# Patient Record
Sex: Male | Born: 1955 | Race: Black or African American | Hispanic: No | Marital: Married | Smoking: Current every day smoker
Health system: Southern US, Community
[De-identification: ages and names within clinical notes are randomized; demographics above are authoritative.]

## PROBLEM LIST (undated history)

## (undated) DIAGNOSIS — F1010 Alcohol abuse, uncomplicated: Secondary | ICD-10-CM

## (undated) DIAGNOSIS — Z9989 Dependence on other enabling machines and devices: Secondary | ICD-10-CM

## (undated) DIAGNOSIS — R42 Dizziness and giddiness: Secondary | ICD-10-CM

## (undated) DIAGNOSIS — I10 Essential (primary) hypertension: Secondary | ICD-10-CM

## (undated) HISTORY — PX: OTHER SURGICAL HISTORY: SHX169

## (undated) HISTORY — PX: MOUTH SURGERY: SHX715

---

## 2001-05-10 ENCOUNTER — Encounter: Payer: Self-pay | Admitting: Internal Medicine

## 2001-05-10 ENCOUNTER — Ambulatory Visit (HOSPITAL_COMMUNITY): Admission: RE | Admit: 2001-05-10 | Discharge: 2001-05-10 | Payer: Self-pay | Admitting: Internal Medicine

## 2006-06-14 ENCOUNTER — Emergency Department (HOSPITAL_COMMUNITY): Admission: EM | Admit: 2006-06-14 | Discharge: 2006-06-14 | Payer: Self-pay | Admitting: Emergency Medicine

## 2007-07-21 ENCOUNTER — Emergency Department (HOSPITAL_COMMUNITY): Admission: EM | Admit: 2007-07-21 | Discharge: 2007-07-21 | Payer: Self-pay | Admitting: Emergency Medicine

## 2007-07-22 ENCOUNTER — Emergency Department (HOSPITAL_COMMUNITY): Admission: EM | Admit: 2007-07-22 | Discharge: 2007-07-22 | Payer: Self-pay | Admitting: Emergency Medicine

## 2007-10-24 HISTORY — PX: COLONOSCOPY: SHX174

## 2008-06-03 ENCOUNTER — Encounter: Payer: Self-pay | Admitting: Internal Medicine

## 2008-06-03 ENCOUNTER — Ambulatory Visit: Payer: Self-pay | Admitting: Internal Medicine

## 2008-06-03 ENCOUNTER — Ambulatory Visit (HOSPITAL_COMMUNITY): Admission: RE | Admit: 2008-06-03 | Discharge: 2008-06-03 | Payer: Self-pay | Admitting: Internal Medicine

## 2008-11-12 ENCOUNTER — Ambulatory Visit: Payer: Self-pay | Admitting: Orthopedic Surgery

## 2008-11-12 ENCOUNTER — Emergency Department (HOSPITAL_COMMUNITY): Admission: EM | Admit: 2008-11-12 | Discharge: 2008-11-13 | Payer: Self-pay | Admitting: Emergency Medicine

## 2009-03-15 ENCOUNTER — Encounter (HOSPITAL_COMMUNITY): Admission: RE | Admit: 2009-03-15 | Discharge: 2009-04-21 | Payer: Self-pay | Admitting: Orthopaedic Surgery

## 2009-04-22 ENCOUNTER — Encounter (HOSPITAL_COMMUNITY): Admission: RE | Admit: 2009-04-22 | Discharge: 2009-05-22 | Payer: Self-pay | Admitting: Orthopaedic Surgery

## 2009-05-24 ENCOUNTER — Encounter (HOSPITAL_COMMUNITY): Admission: RE | Admit: 2009-05-24 | Discharge: 2009-06-23 | Payer: Self-pay | Admitting: Orthopaedic Surgery

## 2009-06-30 ENCOUNTER — Encounter (HOSPITAL_COMMUNITY): Admission: RE | Admit: 2009-06-30 | Discharge: 2009-07-22 | Payer: Self-pay | Admitting: Orthopaedic Surgery

## 2011-02-06 LAB — BASIC METABOLIC PANEL
BUN: 6 mg/dL (ref 6–23)
CO2: 28 mEq/L (ref 19–32)
Calcium: 9.2 mg/dL (ref 8.4–10.5)
Chloride: 99 mEq/L (ref 96–112)
Creatinine, Ser: 0.66 mg/dL (ref 0.4–1.5)
GFR calc Af Amer: >60 mL/min (ref >60)
GFR calc non Af Amer: >60 mL/min (ref >60)
Glucose, Bld: 103 mg/dL — ABNORMAL HIGH (ref 70–99)
Potassium: 3.1 mEq/L — ABNORMAL LOW (ref 3.5–5.1)
Sodium: 131 mEq/L — ABNORMAL LOW (ref 135–145)

## 2011-02-06 LAB — DIFFERENTIAL
Basophils Absolute: 0 10*3/uL (ref 0.0–0.1)
Basophils Relative: 1 % (ref 0–1)
Neutro Abs: 4.1 10*3/uL (ref 1.7–7.7)
Neutrophils Relative %: 55 % (ref 43–77)

## 2011-02-06 LAB — TYPE AND SCREEN
ABO/RH(D): A POS
Antibody Screen: NEGATIVE

## 2011-02-06 LAB — CBC
MCHC: 33.5 g/dL (ref 30.0–36.0)
RDW: 13.1 % (ref 11.5–15.5)

## 2011-03-07 NOTE — Consult Note (Signed)
NAME:  Kenneth Lester, SWIDER NO.:  [id number]   MEDICAL RECORD NO.:  [id number]          PATIENT TYPE:  EMS   LOCATION:  ED                            FACILITY:  [facility]   PHYSICIAN:  Vickki Hearing, M.D.DATE OF BIRTH:  1956/04/04   DATE OF CONSULTATION:  DATE OF DISCHARGE:  11/13/2008                                 CONSULTATION   REASON FOR CONSULTATION:  Consultation is being requested in the  emergency room.   HISTORY OF PRESENT ILLNESS:  The patient is a 55 year old male with  hypertension who takes atenolol, Norvasc and hydrochlorothiazide.  He  was working at [employer] when a forklift hit him from behind.  Approximate time of injury 9:30 p.m.  The patient presented to the  emergency room with deformity of his right lower extremity with a chief  complaint of right foot pain.  He sustained what is an open calcaneus  fracture with an 8 cm circumferential degloving injury with full-  thickness skin flap loss, displacement, comminution with associated  injuries of the fifth metacarpal head with comminution, fifth metatarsal  head comminution, fourth metatarsal phalangeal dislocation, third  metatarsal head fracture and proximal phalanx fracture, first proximal  phalanx fracture, all in the foot.  There are no other injuries.  He did  have a potassium of 3.1.  Sodium 131, BUN and creatinine of 6 and 0.66.   Past family and social history and review of systems are recorded in the  [facility] medical record and has been reviewed.  We did give him 10 mEq of  potassium chloride and 100 mL normal saline, and he got a gram of Ancef.  He says his tetanus shot was up-to-date and has been given the last 2-3  years.   PHYSICAL EXAMINATION:  VITAL SIGNS:  Stable.  GENERAL:  He was awake, alert and oriented x3.  NEUROLOGICAL:  His mood and affect was normal.  He did have normal  sensation in the right foot.  EXTREMITIES:  The dorsalis pedis and posterior tibial ulcers were  normal  and bounding.  There was no tension in the foot.  There was no  significant swelling in the foot and nail compartments were soft.  He  had tenderness throughout his foot.  There was some deformity of the  foot as well over the fractures.  The wound is best described as an 8 cm  long wound on the posterior part of the lower leg with 50% circumference  degloving type injury with full-thickness skin flap.  There are no  obvious injuries to his upper extremities.  He has full range of motion.  No pain, tenderness, swelling or deformity and normal upper extremity  pulses and sensation.  LYMPH NODE:  Exam was deferred.  SKIN:  Exam as stated.   STUDIES:  X-rays were done of his foot and his ankle.  His ankle mortise  is intact.  There was comminuted fractures of the calcaneus with what  appears to be a tongue-type variant fracture of the calcaneus.  There  are multiple fractures in the forefoot as described.  ASSESSMENT:  The patient was placed in a splint placed over saline  soaked gauze dressings.   Dr. Emelda Fear was consulted at [facility] and accepted the patient in  transfer.   These findings and reason for transfer have been explained to the  patient and the reason for transfer is for definitive care of the  calcaneus fracture and the soft tissue wounds which will most likely  need internal fixation and perhaps plastic surgeon consult.      Vickki Hearing, M.D.  Electronically Signed     SEH/MEDQ  D:  11/13/2008  T:  11/13/2008  Job:  [id number]

## 2011-03-07 NOTE — Op Note (Signed)
NAME:  Kenneth Lester, Kenneth Lester              ACCOUNT NO.:  [id number]   MEDICAL RECORD NO.:  [id number]          PATIENT TYPE:  AMB   LOCATION:  DAY                           FACILITY:  [facility]   PHYSICIAN:  R. Roetta Sessions, M.D. DATE OF BIRTH:  Feb 15, 1956   DATE OF PROCEDURE:  DATE OF DISCHARGE:                               OPERATIVE REPORT   INDICATIONS FOR PROCEDURE:  The patient is a 55 year old African-  American gentleman sent over at the courtesy of Dr. Ouida Sills for colorectal  cancer screening.  He has never had his lower GI tract evaluated.  He  has no lower GI tract symptoms.  There is no family history of  colorectal neoplasia or polyps.  He did eat a baloney and egg sandwich  yesterday in contrast to a corn flake with prep recommendations.  Colonoscopy is now being done.  Risks, benefits, alternatives, and  limitations have been reviewed.  Questions answered.  Please see the  documentation in the medical record.   PROCEDURE NOTE:  O2 saturation, blood pressure, pulse, and respirations  were monitored throughout the entire procedure.   CONSCIOUS SEDATION:  Versed 4 mg IV and Demerol 100 mg IV in divided  doses.   INSTRUMENT:  Pentax video chip system.   FINDINGS:  Digital rectal exam revealed no abnormalities.   ENDOSCOPIC FINDINGS:  Prep was suboptimal and relatively poor on the  right side.  Colon, colonic mucosa was surveyed from the rectosigmoid  junction through the left transverse, right colon, appendiceal orifice,  ileocecal valve, and cecum.  These structures were well seen and  photographed for the record.  From this level, the scope was slowly  withdrawn.  All previously mentioned mucosal surfaces were again seen.  The colonic mucosa appeared grossly normal.  There was tenacious film of  thick stool covering good part of the right colon, which made the exam  much more difficult.  A small polyp or other lesion may not have been  seen because of the poor prep today.   However, the colonic mucosa seen  did appear normal.  Scope was pulled down to rectum where thorough  examination of the rectal mucosa including retroflexed view of the anal  verge demonstrated a distal diminutive polyp, which was cold  biopsied/removed.  The patient also had anal papilla.  The patient  tolerated the procedure well and was reactive to endoscopy.   IMPRESSION:  Anal papilla, distal diminutive rectal polyp, status post  cold biopsy removal, otherwise normal rectum, grossly normal colonic  mucosa but poor prep made the exam more challenging.   RECOMMENDATIONS:  1. Follow up on path.  2. Further recommendations to follow.      Jonathon Bellows, M.D.  Electronically Signed     RMR/MEDQ  D:  06/03/2008  T:  06/03/2008  Job:  [id number]   cc:   Kingsley Callander. Ouida Sills, MD  Fax: [phone]

## 2011-07-08 ENCOUNTER — Emergency Department (HOSPITAL_COMMUNITY)
Admission: EM | Admit: 2011-07-08 | Discharge: 2011-07-08 | Disposition: A | Discharge status: Home / Self Care | Payer: PRIVATE HEALTH INSURANCE | Attending: Emergency Medicine | Admitting: Emergency Medicine

## 2011-07-08 ENCOUNTER — Encounter: Payer: Self-pay | Admitting: *Deleted

## 2011-07-08 DIAGNOSIS — I10 Essential (primary) hypertension: Secondary | ICD-10-CM | POA: Insufficient documentation

## 2011-07-08 DIAGNOSIS — H6690 Otitis media, unspecified, unspecified ear: Secondary | ICD-10-CM | POA: Insufficient documentation

## 2011-07-08 DIAGNOSIS — H6692 Otitis media, unspecified, left ear: Secondary | ICD-10-CM

## 2011-07-08 DIAGNOSIS — F17200 Nicotine dependence, unspecified, uncomplicated: Secondary | ICD-10-CM | POA: Insufficient documentation

## 2011-07-08 HISTORY — DX: Essential (primary) hypertension: I10

## 2011-07-08 MED ORDER — AMOXICILLIN-POT CLAVULANATE 875-125 MG PO TABS: 1.0000 | ORAL_TABLET | Freq: Two times a day (BID) | ORAL | Status: AC

## 2011-07-08 MED ORDER — AMOXICILLIN-POT CLAVULANATE 875-125 MG PO TABS
1.0000 | ORAL_TABLET | Freq: Once | ORAL | Status: AC
  Administered 2011-07-08: 1 via ORAL
  Filled 2011-07-08: qty 1

## 2011-07-08 NOTE — ED Notes (Signed)
Pt refused to get undressed for ear pain.

## 2011-07-08 NOTE — ED Notes (Signed)
Left ear pain x 2 days. "I hear an echo too." per pt. Tried ear wax removal med at home and made pain worse per family member. NAD

## 2011-07-08 NOTE — ED Provider Notes (Signed)
History     CSN: [id number] Arrival date & time: 07/08/2011  1:53 PM Scribed for Kenneth Hutching, MD, the patient was seen in room APA09/APA09. This chart was scribed by Katha Cabal. This patient's care was started at 3:58PM.    Chief Complaint  Patient presents with  . Otalgia      HPI  Kenneth Lester is a 55 y.o. male who presents to the Emergency Department complaining of gradually onset of left otalgia that began two weeks ago with associated muffled hearing in left ear.  Denies sore throat, coughing, neck pain, chest pain, abdominal pain, peripheral edema, and headache.   Patient's wife states that the patient used ear wax removal kit at home which made the pain worse.   PAST MEDICAL HISTORY:  Past Medical History  Diagnosis Date  . Hypertension     PAST SURGICAL HISTORY:  Past Surgical History  Procedure Date  . Right ankle surgery   . Mouth surgery     MEDICATIONS:  Previous Medications   No medications on file     ALLERGIES:  Allergies as of 07/08/2011  . (Not on File)     FAMILY HISTORY:  No family history on file.   SOCIAL HISTORY: History   Social History  . Marital Status: Married    Spouse Name: N/A    Number of Children: N/A  . Years of Education: N/A   Social History Main Topics  . Smoking status: Current Everyday Smoker  . Smokeless tobacco: None  . Alcohol Use: Yes     Occ  . Drug Use: No  . Sexually Active:    Other Topics Concern  . None   Social History Narrative  . None      Review of Systems 10 Systems reviewed and are negative for acute change except as noted in the HPI.  Physical Exam    BP 125/84  Pulse 78  Temp(Src) 98.5 F (36.9 C) (Oral)  Resp 16  Ht 5\' 8"  (1.727 m)  Wt 130 lb (58.968 kg)  BMI 19.77 kg/m2  SpO2 98%  Physical Exam  Nursing note and vitals reviewed. Constitutional: He is oriented to person, place, and time. No distress.       Appearance consistent with age of record  HENT:  Head:  Normocephalic and atraumatic.  Right Ear: External ear normal.  Left Ear: No foreign bodies. Tympanic membrane is erythematous.  Nose: Nose normal.  Eyes: Conjunctivae are normal.  Neck: Neck supple.  Cardiovascular: Normal rate and regular rhythm.  Exam reveals no gallop and no friction rub.   No murmur heard. Pulmonary/Chest: Effort normal and breath sounds normal. He has no wheezes. He has no rhonchi. He has no rales. He exhibits no tenderness.  Abdominal: Soft. There is no tenderness.  Musculoskeletal: Normal range of motion.       Normal appearance of extremities  Neurological: He is alert and oriented to person, place, and time. No sensory deficit.  Skin: No rash noted.       Color normal  Psychiatric: He has a normal mood and affect.    ED Course  Procedures   OTHER DATA REVIEWED: Nursing notes, vital signs, and past medical records reviewed.    DIAGNOSTIC STUDIES: Oxygen Saturation is 98% on room air, normal by my interpretation.      ED COURSE / COORDINATION OF CARE: 4:00 PM  Discussed diagnosis of left otitis media and need for antibiotics with patient and patient's wife.  Will give patient his first dose of antibiotics in ED.  Plan to discharge patient and patient agreed.     MDM: hx and pe c/w LOM;  rx augmentin   IMPRESSION: Diagnoses that have been ruled out:  Diagnoses that are still under consideration:  Final diagnoses:  Left otitis media    PLAN:  Home Advised to return for worsening or additional problems such as abdominal or chest pain The patient is to return the emergency department if there is any worsening of symptoms. I have reviewed the discharge instructions with the patient.    CONDITION ON DISCHARGE: Good   MEDICATIONS GIVEN IN THE E.D. Scheduled Meds:   . amoxicillin-clavulanate  1 tablet Oral Once   Continuous Infusions:     DISCHARGE MEDICATIONS: New Prescriptions   AMOXICILLIN-CLAVULANATE (AUGMENTIN) 875-125 MG PER  TABLET    Take 1 tablet by mouth 2 (two) times daily. One po bid x 7 days  I personally performed the services described in this documentation, which was scribed in my presence. The recorded information has been reviewed and considered. No att. providers found       Kenneth Hutching, MD 07/10/11 1650

## 2011-07-08 NOTE — ED Notes (Signed)
MD at bedside. 

## 2013-12-21 ENCOUNTER — Emergency Department (HOSPITAL_COMMUNITY)
Admission: EM | Admit: 2013-12-21 | Discharge: 2013-12-21 | Disposition: A | Discharge status: Home / Self Care | Payer: PRIVATE HEALTH INSURANCE | Attending: Emergency Medicine | Admitting: Emergency Medicine

## 2013-12-21 ENCOUNTER — Encounter (HOSPITAL_COMMUNITY): Payer: Self-pay | Admitting: Emergency Medicine

## 2013-12-21 ENCOUNTER — Emergency Department (HOSPITAL_COMMUNITY): Payer: PRIVATE HEALTH INSURANCE

## 2013-12-21 DIAGNOSIS — S40019A Contusion of unspecified shoulder, initial encounter: Secondary | ICD-10-CM | POA: Diagnosis not present

## 2013-12-21 DIAGNOSIS — I10 Essential (primary) hypertension: Secondary | ICD-10-CM | POA: Diagnosis not present

## 2013-12-21 DIAGNOSIS — Y939 Activity, unspecified: Secondary | ICD-10-CM | POA: Insufficient documentation

## 2013-12-21 DIAGNOSIS — Z79899 Other long term (current) drug therapy: Secondary | ICD-10-CM | POA: Diagnosis not present

## 2013-12-21 DIAGNOSIS — F17200 Nicotine dependence, unspecified, uncomplicated: Secondary | ICD-10-CM | POA: Diagnosis not present

## 2013-12-21 DIAGNOSIS — S42033A Displaced fracture of lateral end of unspecified clavicle, initial encounter for closed fracture: Secondary | ICD-10-CM | POA: Insufficient documentation

## 2013-12-21 DIAGNOSIS — W010XXA Fall on same level from slipping, tripping and stumbling without subsequent striking against object, initial encounter: Secondary | ICD-10-CM | POA: Diagnosis not present

## 2013-12-21 DIAGNOSIS — Z9889 Other specified postprocedural states: Secondary | ICD-10-CM | POA: Insufficient documentation

## 2013-12-21 DIAGNOSIS — Y929 Unspecified place or not applicable: Secondary | ICD-10-CM | POA: Insufficient documentation

## 2013-12-21 DIAGNOSIS — S46909A Unspecified injury of unspecified muscle, fascia and tendon at shoulder and upper arm level, unspecified arm, initial encounter: Secondary | ICD-10-CM | POA: Diagnosis present

## 2013-12-21 DIAGNOSIS — S4980XA Other specified injuries of shoulder and upper arm, unspecified arm, initial encounter: Secondary | ICD-10-CM | POA: Diagnosis present

## 2013-12-21 DIAGNOSIS — S42002A Fracture of unspecified part of left clavicle, initial encounter for closed fracture: Secondary | ICD-10-CM

## 2013-12-21 MED ORDER — HYDROCODONE-ACETAMINOPHEN 5-325 MG PO TABS: 1.0000 | ORAL_TABLET | ORAL | Status: DC | PRN

## 2013-12-21 NOTE — ED Notes (Signed)
Pt c/o left shoulder after fall last night, states landed on left shoulder

## 2013-12-21 NOTE — ED Provider Notes (Signed)
Medical screening examination/treatment/procedure(s) were performed by non-physician practitioner and as supervising physician I was immediately available for consultation/collaboration.   EKG Interpretation None       Donnetta HutchingBrian Finnley Larusso, MD 12/21/13 2235

## 2013-12-21 NOTE — Discharge Instructions (Signed)
Clavicle Fracture °A clavicle fracture is a break in the collarbone. This is a common injury, especially in children. Collarbones do not harden until around the age of 20. Most collarbone fractures are treated with a simple arm sling. In some cases a figure-of-eight splint is used to help hold the broken bones in position. Although not often needed, surgery may be required if the bone fragments are not in the correct position (displaced).  °HOME CARE INSTRUCTIONS  °· Apply ice to the injury for 15-20 minutes each hour while awake for 2 days. Put the ice in a plastic bag and place a towel between the bag of ice and your skin. °· Wear the sling or splint constantly for as long as directed by your caregiver. You may remove the sling or splint for bathing or showering. Be sure to keep your shoulder in the same place as when the sling or splint is on. Do not lift your arm. °· If a figure-of-eight splint is applied, it must be tightened by another person every day. Tighten it enough to keep the shoulders held back. Allow enough room to place the index finger between the body and strap. Loosen the splint immediately if you feel numbness or tingling in your hands. °· Only take over-the-counter or prescription medicines for pain, discomfort, or fever as directed by your caregiver. °· Avoid activities that irritate or increase the pain for 4 to 6 weeks after surgery. °· Follow all instructions for follow-up with your caregiver. This includes any referrals, physical therapy, and rehabilitation. Any delay in obtaining necessary care could result in a delay or failure of the injury to heal properly. °SEEK MEDICAL CARE IF:  °You have pain and swelling that are not relieved with medications. °SEEK IMMEDIATE MEDICAL CARE IF:  °Your arm is numb, cold, or pale, even when the splint is loose. °MAKE SURE YOU:  °· Understand these instructions. °· Will watch your condition. °· Will get help right away if you are not doing well or get  worse. °Document Released: 07/19/2005 Document Revised: 01/01/2012 Document Reviewed: 05/14/2008 °Exit[employer]® Patient Information ©2014 [employer]. ° °

## 2013-12-21 NOTE — ED Provider Notes (Signed)
CSN: [id number]     Arrival date & time 12/21/13  1902 History   First MD Initiated Contact with Patient 12/21/13 1919     Chief Complaint  Patient presents with  . Fall     (Consider location/radiation/quality/duration/timing/severity/associated sxs/prior Treatment) HPI Comments: Patient is frail appearing 58 year old male with PMHx significant for HTN and right ankle surgery who walks with a cane at home who presents with his wife who states that the patient fell last night and has been complaining of left shoulder pain since the fall.  He states that he tripped over his cane and fell onto his left outstretched arm.  States left shoulder pain, bruising to the left clavicle and deformity.  Reports has pain with attempting to raise his arm above his head.  He is right handed  Patient is a 58 y.o. male presenting with fall. The history is provided by the patient and the spouse. No language interpreter was used.  Fall This is a new problem. The current episode started yesterday. The problem occurs constantly. The problem has been unchanged. Associated symptoms include arthralgias and myalgias. Pertinent negatives include no abdominal pain, chest pain, chills, congestion, coughing, fatigue, fever, headaches, joint swelling, nausea, neck pain, numbness, rash, sore throat, urinary symptoms, visual change, vomiting or weakness. The symptoms are aggravated by bending. He has tried nothing for the symptoms. The treatment provided no relief.    Past Medical History  Diagnosis Date  . Hypertension    Past Surgical History  Procedure Laterality Date  . Right ankle surgery    . Mouth surgery     History reviewed. No pertinent family history. History  Substance Use Topics  . Smoking status: Current Every Day Smoker  . Smokeless tobacco: Not on file  . Alcohol Use: Yes     Comment: Occ    Review of Systems  Constitutional: Negative for fever, chills and fatigue.  HENT: Negative for congestion  and sore throat.   Respiratory: Negative for cough.   Cardiovascular: Negative for chest pain.  Gastrointestinal: Negative for nausea, vomiting and abdominal pain.  Musculoskeletal: Positive for arthralgias and myalgias. Negative for joint swelling and neck pain.  Skin: Negative for rash.  Neurological: Negative for weakness, numbness and headaches.  All other systems reviewed and are negative.      Allergies  Review of patient's allergies indicates no known allergies.  Home Medications   Current Outpatient Rx  Name  Route  Sig  Dispense  Refill  . amLODipine (NORVASC) 10 MG tablet   Oral   Take 10 mg by mouth daily.         Marland Kitchen atenolol (TENORMIN) 25 MG tablet   Oral   Take 25 mg by mouth daily.         . hydrochlorothiazide (HYDRODIURIL) 25 MG tablet   Oral   Take 25 mg by mouth daily.          BP 145/93  Pulse 94  Temp(Src) 97.8 F (36.6 C) (Oral)  Resp 16  Ht 5\' 8"  (1.727 m)  Wt 130 lb (58.968 kg)  BMI 19.77 kg/m2  SpO2 97% Physical Exam  Nursing note and vitals reviewed. Constitutional: He is oriented to person, place, and time. He appears well-developed and well-nourished. No distress.  cachetic  HENT:  Head: Normocephalic and atraumatic.  Mouth/Throat: Oropharynx is clear and moist.  Eyes: Conjunctivae are normal. Pupils are equal, round, and reactive to light. No scleral icterus.  Neck: Normal range of  motion. Neck supple. No spinous process tenderness and no muscular tenderness present.    Cardiovascular: Normal rate, regular rhythm and normal heart sounds.  Exam reveals no gallop and no friction rub.   No murmur heard. Pulmonary/Chest: Effort normal and breath sounds normal. No respiratory distress. He has no wheezes. He has no rales. He exhibits no tenderness.  Abdominal: Soft. Bowel sounds are normal. He exhibits no distension. There is no tenderness.  Musculoskeletal:       Left shoulder: He exhibits decreased range of motion, tenderness,  bony tenderness and deformity. He exhibits no swelling, no laceration, normal pulse and normal strength.       Arms: Lymphadenopathy:    He has no cervical adenopathy.  Neurological: He is alert and oriented to person, place, and time. He exhibits normal muscle tone. Coordination normal.  Skin: Skin is warm and dry. No rash noted. No erythema. No pallor.  Psychiatric: He has a normal mood and affect. His behavior is normal. Judgment and thought content normal.    ED Course  Procedures (including critical care time) Labs Review Labs Reviewed - No data to display Imaging Review Dg Shoulder Left  12/21/2013   CLINICAL DATA:  Fall.  EXAM: LEFT SHOULDER - 2+ VIEW  COMPARISON:  None.  FINDINGS: There is a displaced distal left clavicle fracture. There is approximately 1 shaft width of superior displacement of the proximal fragment. There are several old left posterior rib fractures.  IMPRESSION: Displaced distal left clavicle fracture.   Electronically Signed   By: Elberta Fortisaniel  Boyle M.D.   On: 12/21/2013 19:25     EKG Interpretation None      MDM   Left displaced clavicle fracture  Patient here after mechanical fall with left clavicle fracture which is superiorly displaced.  I have discussed this patient with Dr. Romeo AppleHarrison, we will place him in sling and he will follow up with him this week.   Izola PriceFrances C. Marisue HumbleSanford, PA-C 12/21/13 2023

## 2013-12-22 ENCOUNTER — Ambulatory Visit (INDEPENDENT_AMBULATORY_CARE_PROVIDER_SITE_OTHER): Payer: PRIVATE HEALTH INSURANCE | Admitting: Orthopedic Surgery

## 2013-12-22 ENCOUNTER — Encounter: Payer: Self-pay | Admitting: Orthopedic Surgery

## 2013-12-22 VITALS — BP 147/89 | Ht 68.0 in | Wt 117.0 lb

## 2013-12-22 DIAGNOSIS — S42033A Displaced fracture of lateral end of unspecified clavicle, initial encounter for closed fracture: Secondary | ICD-10-CM

## 2013-12-22 MED ORDER — HYDROCODONE-ACETAMINOPHEN 5-325 MG PO TABS: 1.0000 | ORAL_TABLET | ORAL | Status: DC | PRN

## 2013-12-22 NOTE — Progress Notes (Signed)
Patient ID: Dagmar HaitJames W Claassen, male   DOB: April 09, 1956, 58 y.o.   MRN: [id number]  Chief Complaint  Patient presents with  . Shoulder Pain    Fractured left clavicle d/t injury 12/21/13    HISTORY: 58 year old male fractured his left distal clavicle Saturday went to the hospital Sunday x-rays show a distal clavicle fracture type II with superior displacement it appears to be involving the coracoclavicular ligaments. His pain is 4/10 describes it as dull worse if he tries to move his arm better if he keeps it still he is in a shoulder immobilizer  His review of systems is negative except for excessive thirst  No allergies  Hypertension  Ankle surgery  Family history of heart disease  Married disabled smokes a pack of cigarettes a day drinks a can of beer a day he drinks 2-3 cups of coffee a day  Vital signs:   General the patient is well-developed and well-nourished grooming and hygiene are normal Oriented x3 Mood and affect normal Ambulation normal  Inspection of the left shoulder shows no skin issue. Decreased range of motion. Shoulder elbow wrist stable. Muscle tone normal. Skin clean dry intact. Cardiovascular exam is normal Sensory exam normal  Type II distal clavicle appears to be within the coracoclavicular ligament area  Recommend continue sling-and-swathe for 3 weeks repeat x-ray

## 2013-12-22 NOTE — Patient Instructions (Signed)
Wear sling x 3 weeks then come back for xray left clavicle

## 2014-01-15 ENCOUNTER — Ambulatory Visit (INDEPENDENT_AMBULATORY_CARE_PROVIDER_SITE_OTHER): Payer: Self-pay | Admitting: Orthopedic Surgery

## 2014-01-15 ENCOUNTER — Encounter: Payer: Self-pay | Admitting: Orthopedic Surgery

## 2014-01-15 ENCOUNTER — Ambulatory Visit (INDEPENDENT_AMBULATORY_CARE_PROVIDER_SITE_OTHER): Payer: PRIVATE HEALTH INSURANCE

## 2014-01-15 VITALS — BP 147/86 | Ht 68.0 in | Wt 117.0 lb

## 2014-01-15 DIAGNOSIS — S42009A Fracture of unspecified part of unspecified clavicle, initial encounter for closed fracture: Secondary | ICD-10-CM

## 2014-01-15 NOTE — Progress Notes (Signed)
Patient ID: Kenneth Lester, male   DOB: 1956-02-19, 58 y.o.   MRN: [id number]  Chief Complaint  Patient presents with  . Follow-up    3 week recheck on left clavicle fracture with xray. [December 21, 2013]   The patient is only needing ibuprofen for pain his x-ray shows no evidence of callus formation but is not having any discomfort or a graft he can wear the sling 1 more week then come back in 3 weeks for this last x-ray and we may or may not start therapy at that point.

## 2014-01-15 NOTE — Patient Instructions (Signed)
Wear sling for one more week

## 2014-02-10 ENCOUNTER — Ambulatory Visit (INDEPENDENT_AMBULATORY_CARE_PROVIDER_SITE_OTHER): Payer: Self-pay | Admitting: Orthopedic Surgery

## 2014-02-10 ENCOUNTER — Ambulatory Visit (INDEPENDENT_AMBULATORY_CARE_PROVIDER_SITE_OTHER): Payer: PRIVATE HEALTH INSURANCE

## 2014-02-10 VITALS — BP 141/89 | Ht 68.0 in | Wt 117.0 lb

## 2014-02-10 DIAGNOSIS — S42009A Fracture of unspecified part of unspecified clavicle, initial encounter for closed fracture: Secondary | ICD-10-CM

## 2014-02-10 DIAGNOSIS — S42002A Fracture of unspecified part of left clavicle, initial encounter for closed fracture: Secondary | ICD-10-CM

## 2014-02-10 NOTE — Patient Instructions (Signed)
Do home exercises    

## 2014-02-10 NOTE — Progress Notes (Signed)
Patient ID: Dagmar HaitJames W Maret, male   DOB: 24-Oct-1955, 58 y.o.   MRN: [id number]   3 weeks post distal clavicle fracture treated closed, x-rays today. He says he still almost everything now including taking care of the household chores. He did want to go to therapy been more home exercise. X-rays show distal clavicle fracture with mild superior displacement  Recommend continue conservative care I gave him some exercises to do anyway followup as needed  Note skin is intact

## 2014-10-30 DIAGNOSIS — Z23 Encounter for immunization: Secondary | ICD-10-CM | POA: Diagnosis not present

## 2014-10-30 DIAGNOSIS — N522 Drug-induced erectile dysfunction: Secondary | ICD-10-CM | POA: Diagnosis not present

## 2014-10-30 DIAGNOSIS — E871 Hypo-osmolality and hyponatremia: Secondary | ICD-10-CM | POA: Diagnosis not present

## 2014-10-30 DIAGNOSIS — I10 Essential (primary) hypertension: Secondary | ICD-10-CM | POA: Diagnosis not present

## 2015-01-29 DIAGNOSIS — Z79899 Other long term (current) drug therapy: Secondary | ICD-10-CM | POA: Diagnosis not present

## 2015-02-04 DIAGNOSIS — I10 Essential (primary) hypertension: Secondary | ICD-10-CM | POA: Diagnosis not present

## 2015-02-04 DIAGNOSIS — F1021 Alcohol dependence, in remission: Secondary | ICD-10-CM | POA: Diagnosis not present

## 2015-02-04 DIAGNOSIS — E871 Hypo-osmolality and hyponatremia: Secondary | ICD-10-CM | POA: Diagnosis not present

## 2015-02-06 ENCOUNTER — Emergency Department (HOSPITAL_COMMUNITY)
Admission: EM | Admit: 2015-02-06 | Discharge: 2015-02-06 | Disposition: A | Discharge status: Home / Self Care | Payer: Medicare Other | Attending: Emergency Medicine | Admitting: Emergency Medicine

## 2015-02-06 ENCOUNTER — Encounter (HOSPITAL_COMMUNITY): Payer: Self-pay | Admitting: Emergency Medicine

## 2015-02-06 ENCOUNTER — Emergency Department (HOSPITAL_COMMUNITY): Payer: Medicare Other

## 2015-02-06 ENCOUNTER — Encounter (HOSPITAL_COMMUNITY): Payer: Self-pay | Admitting: Cardiology

## 2015-02-06 DIAGNOSIS — Z79899 Other long term (current) drug therapy: Secondary | ICD-10-CM | POA: Diagnosis not present

## 2015-02-06 DIAGNOSIS — Z720 Tobacco use: Secondary | ICD-10-CM | POA: Insufficient documentation

## 2015-02-06 DIAGNOSIS — R42 Dizziness and giddiness: Secondary | ICD-10-CM | POA: Insufficient documentation

## 2015-02-06 DIAGNOSIS — I10 Essential (primary) hypertension: Secondary | ICD-10-CM | POA: Insufficient documentation

## 2015-02-06 DIAGNOSIS — R5383 Other fatigue: Secondary | ICD-10-CM | POA: Diagnosis not present

## 2015-02-06 LAB — CBC
HCT: 43.6 % (ref 39.0–52.0)
Hemoglobin: 16.1 g/dL (ref 13.0–17.0)
MCH: 31.3 pg (ref 26.0–34.0)
MCHC: 36.9 g/dL — AB (ref 30.0–36.0)
MCV: 84.8 fL (ref 78.0–100.0)
PLATELETS: 236 10*3/uL (ref 150–400)
RBC: 5.14 MIL/uL (ref 4.22–5.81)
RDW: 13.6 % (ref 11.5–15.5)
WBC: 6.1 10*3/uL (ref 4.0–10.5)

## 2015-02-06 LAB — BASIC METABOLIC PANEL
Anion gap: 12 (ref 5–15)
BUN: <5 mg/dL — ABNORMAL LOW (ref 6–23)
CO2: 28 mmol/L (ref 19–32)
Calcium: 9.8 mg/dL (ref 8.4–10.5)
Chloride: 89 mmol/L — ABNORMAL LOW (ref 96–112)
Creatinine, Ser: 0.51 mg/dL (ref 0.50–1.35)
GFR calc Af Amer: >90 mL/min (ref >90)
GFR calc non Af Amer: >90 mL/min (ref >90)
Glucose, Bld: 102 mg/dL — ABNORMAL HIGH (ref 70–99)
POTASSIUM: 3.3 mmol/L — AB (ref 3.5–5.1)
Sodium: 129 mmol/L — ABNORMAL LOW (ref 135–145)

## 2015-02-06 MED ORDER — MECLIZINE HCL 25 MG PO TABS: ORAL_TABLET | ORAL | Status: DC

## 2015-02-06 MED ORDER — SODIUM CHLORIDE 0.9 % IV BOLUS (SEPSIS)
1000.0000 mL | Freq: Once | INTRAVENOUS | Status: AC
  Administered 2015-02-06: 1000 mL via INTRAVENOUS

## 2015-02-06 MED ORDER — MECLIZINE HCL 12.5 MG PO TABS
25.0000 mg | ORAL_TABLET | Freq: Once | ORAL | Status: AC
  Administered 2015-02-06: 25 mg via ORAL
  Filled 2015-02-06: qty 2

## 2015-02-06 NOTE — Discharge Instructions (Signed)

## 2015-02-06 NOTE — ED Provider Notes (Signed)
CSN: [id number]     Arrival date & time 02/06/15  2017 History  This chart was scribed for Rolland PorterMark Lansing, MD by Jarvis Morganaylor Ferguson, ED Scribe. This patient was seen in room APA08/APA08 and the patient's care was started at 8:52 PM.    Chief Complaint  Patient presents with  . Hypertension    The history is provided by the patient and the spouse. No language interpreter was used.    HPI Comments: Kenneth Lester is a 59 y.o. male with a h/o HTN who presents to the Emergency Department complaining of dizziness that began 12 hours ago. Pt states he has been feeling fatigued, off balance and "wobbly" when trying to ambulate. He believes his symptoms to be due to his HTN. Pt BP upon arrival to the ED was 174/79. He notes that he feels like he needs to grab onto to something when walking. He denies feeling like he is veering or favoring one side while ambulating. Pt was in the ED this morning for similar symptoms. Pt states this has never happened to him before. Pt went for a routine visit with his physician this week and was told his BP was running slightly high. His doctor prescribed him a higher dose of his BP medication but states he has not started the medication cause his prescriptions are mail order. Wife notes he has had vertigo symptoms about 2 years ago and was put on medication. Pt is a current everyday smoker but reports he is trying to quit. He denies lightheadedness or weakness.   Past Medical History  Diagnosis Date  . Hypertension    Past Surgical History  Procedure Laterality Date  . Right ankle surgery    . Mouth surgery     History reviewed. No pertinent family history. History  Substance Use Topics  . Smoking status: Current Every Day Smoker  . Smokeless tobacco: Not on file  . Alcohol Use: Yes     Comment: Occ    Review of Systems  Constitutional: Positive for fatigue. Negative for fever, chills, diaphoresis and appetite change.  HENT: Negative for mouth sores, sore throat  and trouble swallowing.   Eyes: Negative for visual disturbance.  Respiratory: Negative for cough, chest tightness, shortness of breath and wheezing.   Cardiovascular: Negative for chest pain.  Gastrointestinal: Negative for nausea, vomiting, abdominal pain, diarrhea and abdominal distention.  Endocrine: Negative for polydipsia, polyphagia and polyuria.  Genitourinary: Negative for dysuria, frequency and hematuria.  Musculoskeletal: Negative for gait problem.  Skin: Negative for color change, pallor and rash.  Neurological: Positive for dizziness. Negative for syncope, weakness, light-headedness and headaches.  Hematological: Does not bruise/bleed easily.  Psychiatric/Behavioral: Negative for behavioral problems and confusion.    Allergies  Other  Home Medications   Prior to Admission medications   Medication Sig Start Date End Date Taking? Authorizing Provider  amLODipine (NORVASC) 10 MG tablet Take 10 mg by mouth daily.   Yes Historical Provider, MD  atenolol (TENORMIN) 25 MG tablet Take 25 mg by mouth daily.   Yes Historical Provider, MD  hydrochlorothiazide (HYDRODIURIL) 25 MG tablet Take 25 mg by mouth daily.   Yes Historical Provider, MD  ibuprofen (ADVIL,MOTRIN) 200 MG tablet Take 200 mg by mouth every 6 (six) hours as needed for moderate pain.   Yes Historical Provider, MD  HYDROcodone-acetaminophen (NORCO/VICODIN) 5-325 MG per tablet Take 1 tablet by mouth every 4 (four) hours as needed. Patient not taking: Reported on 02/06/2015 12/22/13   Vickki HearingStanley E Harrison,  MD  meclizine (ANTIVERT) 25 MG tablet Take until 24 hours without dizziness 02/06/15   Rolland Porter, MD   Triage Vitals: BP 174/79 mmHg  Pulse 66  Temp(Src) 98.6 F (37 C) (Oral)  Resp 16  Ht  (1.727 m)  Wt 125 lb (56.7 kg)  BMI 19.01 kg/m2  SpO2 100%  Physical Exam  Constitutional: He is oriented to person, place, and time. He appears well-developed and well-nourished. No distress.  HENT:  Head: Normocephalic.   Eyes: Conjunctivae are normal. Pupils are equal, round, and reactive to light. No scleral icterus. Right eye exhibits no nystagmus. Left eye exhibits no nystagmus.  Neck: Normal range of motion. Neck supple. No thyromegaly present.  Cardiovascular: Normal rate and regular rhythm.  Exam reveals no gallop and no friction rub.   No murmur heard. Pulmonary/Chest: Effort normal and breath sounds normal. No respiratory distress. He has no wheezes. He has no rales.  Abdominal: Soft. Bowel sounds are normal. He exhibits no distension. There is no tenderness. There is no rebound.  Musculoskeletal: Normal range of motion.  Neurological: He is alert and oriented to person, place, and time. He has normal strength. No cranial nerve deficit.  Skin: Skin is warm and dry. No rash noted.  Psychiatric: He has a normal mood and affect. His behavior is normal.    ED Course  Procedures (including critical care time)  DIAGNOSTIC STUDIES: Oxygen Saturation is 100% on RA, normal by my interpretation.    COORDINATION OF CARE: 8:59 PM- Will order Antivert and Head CT w/o contrast. Pt advised of plan for treatment and pt agrees.  Labs Review Labs Reviewed - No data to display  Imaging Review Ct Head Wo Contrast  02/06/2015   CLINICAL DATA:  Dizziness for 12 hours history of hypertension  EXAM: CT HEAD WITHOUT CONTRAST  TECHNIQUE: Contiguous axial images were obtained from the base of the skull through the vertex without intravenous contrast.  COMPARISON:  None.  FINDINGS: Moderate atrophy. Moderate to severe low attenuation in the deep white matter. No evidence of transcortical infarct. 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 No hemorrhage or extra-axial fluid. Mild inflammatory change posterior ethmoid air cells on the left. The rest of the visualized portions of the paranasal sinuses and mastoid air cells clear. Calvarium intact.  IMPRESSION: Significant chronic ischemic  change.  No acute findings.   Electronically Signed   By: Esperanza Heir M.D.   On: 02/06/2015 21:23     EKG Interpretation None      MDM   Final diagnoses:  Vertigo     Patient feeling improved with symptoms after Antivert. Normal head CT. BP 140/86. Plan is outpatient treatment for acute peripheral vertigo. Continue antihypertensives and prior to follow-up.  I personally performed the services described in this documentation, which was scribed in my presence. The recorded information has been reviewed and is accurate.     Rolland Porter, MD 02/06/15 2140

## 2015-02-06 NOTE — ED Notes (Signed)
Dizziness times 2 days.  Seen family doctor wendesday and had blood pressure med increased.  Has not started yet.

## 2015-02-06 NOTE — Discharge Instructions (Signed)
Benign Positional Vertigo Vertigo means you feel like you or your surroundings are moving when they are not. Benign positional vertigo is the most common form of vertigo. Benign means that the cause of your condition is not serious. Benign positional vertigo is more common in older adults. CAUSES  Benign positional vertigo is the result of an upset in the labyrinth system. This is an area in the middle ear that helps control your balance. This may be caused by a viral infection, head injury, or repetitive motion. However, often no specific cause is found. SYMPTOMS  Symptoms of benign positional vertigo occur when you move your head or eyes in different directions. Some of the symptoms may include:  Loss of balance and falls.  Vomiting.  Blurred vision.  Dizziness.  Nausea.  Involuntary eye movements (nystagmus). DIAGNOSIS  Benign positional vertigo is usually diagnosed by physical exam. If the specific cause of your benign positional vertigo is unknown, your caregiver may perform imaging tests, such as magnetic resonance imaging (MRI) or computed tomography (CT). TREATMENT  Your caregiver may recommend movements or procedures to correct the benign positional vertigo. Medicines such as meclizine, benzodiazepines, and medicines for nausea may be used to treat your symptoms. In rare cases, if your symptoms are caused by certain conditions that affect the inner ear, you may need surgery. HOME CARE INSTRUCTIONS   Follow your caregiver's instructions.  Move slowly. Do not make sudden body or head movements.  Avoid driving.  Avoid operating heavy machinery.  Avoid performing any tasks that would be dangerous to you or others during a vertigo episode.  Drink enough fluids to keep your urine clear or pale yellow. SEEK IMMEDIATE MEDICAL CARE IF:   You develop problems with walking, weakness, numbness, or using your arms, hands, or legs.  You have difficulty speaking.  You develop  severe headaches.  Your nausea or vomiting continues or gets worse.  You develop visual changes.  Your family or friends notice any behavioral changes.  Your condition gets worse.  You have a fever.  You develop a stiff neck or sensitivity to light. MAKE SURE YOU:   Understand these instructions.  Will watch your condition.  Will get help right away if you are not doing well or get worse. Document Released: 07/17/2006 Document Revised: 01/01/2012 Document Reviewed: 06/29/2011 Exit[employer] Patient Information 2015 [employer]. This information is not intended to replace advice given to you by your health care provider. Make sure you discuss any questions you have with your health care provider.    

## 2015-02-06 NOTE — ED Notes (Signed)
Patient reports he was seen here earlier for hypertension, and his blood pressure is elevated again. States was told by PCP that the plan was to increase or change his blood pressure medication. Patient states he feels drowsy, sluggish, and "wobbly" at the moment.

## 2015-02-06 NOTE — ED Provider Notes (Signed)
CSN: [id number]     Arrival date & time 02/06/15  0915 History  This chart was scribed for Azalia Bilis, MD by Modena Jansky, ED Scribe. This patient was seen in room APA04/APA04 and the patient's care was started at 9:28 AM.   Chief Complaint  Patient presents with  . Dizziness   The history is provided by the patient. No language interpreter was used.   HPI Comments: Kenneth Lester is a 59 y.o. male who presents to the Emergency Department complaining of intermittent moderate dizziness that started yesterday. He reports that he has been feeling off-balanced whenever he ambulates since yesterday, but today has worsened. He reports that he went to his PCP yesterday, had a high blood pressure reading, and his blood pressure medication was adjusted. He states that he is currently feeling light headed. He reports that he usually ambulates with a cane due to ankle pain from a past injury. He states that he has been having normal BMs. He denies any headache, weakness, nausea, fever, chills, cough, diarrhea, blood in stool, chest pain, SOB, or abdominal pain.   Past Medical History  Diagnosis Date  . Hypertension    Past Surgical History  Procedure Laterality Date  . Right ankle surgery    . Mouth surgery     History reviewed. No pertinent family history. History  Substance Use Topics  . Smoking status: Current Every Day Smoker  . Smokeless tobacco: Not on file  . Alcohol Use: Yes     Comment: Occ    Review of Systems A complete 10 system review of systems was obtained and all systems are negative except as noted in the HPI and PMH.   Allergies  Other  Home Medications   Prior to Admission medications   Medication Sig Start Date End Date Taking? Authorizing Provider  amLODipine (NORVASC) 10 MG tablet Take 10 mg by mouth daily.   Yes Historical Provider, MD  atenolol (TENORMIN) 25 MG tablet Take 25 mg by mouth daily.   Yes Historical Provider, MD  hydrochlorothiazide (HYDRODIURIL)  25 MG tablet Take 25 mg by mouth daily.   Yes Historical Provider, MD  ibuprofen (ADVIL,MOTRIN) 200 MG tablet Take 200 mg by mouth every 6 (six) hours as needed for moderate pain.   Yes Historical Provider, MD  HYDROcodone-acetaminophen (NORCO/VICODIN) 5-325 MG per tablet Take 1 tablet by mouth every 4 (four) hours as needed. Patient not taking: Reported on 02/06/2015 12/22/13   Vickki Hearing, MD   BP 182/109 mmHg  Pulse 70  Temp(Src) 98.3 F (36.8 C) (Oral)  Resp 16  Ht  (1.727 m)  Wt 125 lb (56.7 kg)  BMI 19.01 kg/m2  SpO2 100% Physical Exam  Constitutional: He is oriented to person, place, and time. He appears well-developed and well-nourished.  HENT:  Head: Normocephalic and atraumatic.  Eyes: EOM are normal. Pupils are equal, round, and reactive to light.  Neck: Normal range of motion.  Cardiovascular: Normal rate, regular rhythm, normal heart sounds and intact distal pulses.   Pulmonary/Chest: Effort normal and breath sounds normal. No respiratory distress.  Abdominal: Soft. He exhibits no distension. There is no tenderness.  Musculoskeletal: Normal range of motion.  Neurological: He is alert and oriented to person, place, and time.  5/5 strength in major muscle groups of  bilateral upper and lower extremities. Speech normal. No facial asymetry. Gait normal.   Skin: Skin is warm and dry.  Psychiatric: He has a normal mood and affect. Judgment normal.  Nursing note and vitals reviewed.   ED Course  Procedures (including critical care time) DIAGNOSTIC STUDIES: Oxygen Saturation is 100% on RA, normal by my interpretation.    COORDINATION OF CARE: 9:32 AM- Pt advised of plan for treatment which includes medication and labs and pt agrees.  Labs Review Labs Reviewed  CBC - Abnormal; Notable for the following:    MCHC 36.9 (*)    All other components within normal limits  BASIC METABOLIC PANEL - Abnormal; Notable for the following:    Sodium 129 (*)    Potassium  3.3 (*)    Chloride 89 (*)    Glucose, Bld 102 (*)    BUN <5 (*)    All other components within normal limits    Imaging Review No results found.   EKG Interpretation   Date/Time:  Saturday February 06 2015 09:28:52 EDT Ventricular Rate:  69 PR Interval:  169 QRS Duration: 78 QT Interval:  383 QTC Calculation: 410 R Axis:   81 Text Interpretation:  Sinus rhythm Anterior infarct, age indeterminate No  old tracing to compare Confirmed by Tashala Cumbo  MD, Caryn BeeKEVIN ([id number]) on 02/06/2015  11:29:30 AM      MDM   Final diagnoses:  Dizziness   11:35 AM Patient was able to ambulate around the emergency department without difficulty.  He walks with a cane.  He has no focal weakness of his arms or legs at this time.  No altered mental status.  He is concerned that this is related to his blood pressure.  I'm not convinced.  Hydrated in the emergency department.  Feeling better.  Asymptomatic at rest.  Doubt vertigo.  Doubt stroke.   I personally performed the services described in this documentation, which was scribed in my presence. The recorded information has been reviewed and is accurate.       Azalia BilisKevin Tasheema Perrone, MD 02/06/15 [id number]

## 2015-02-06 NOTE — ED Notes (Signed)
Discharge instructions given, pt demonstrated teach back and verbal understanding. No concerns voiced.  

## 2015-02-07 NOTE — ED Notes (Signed)
Pharmacist Lorin Picket([place]) with [facility] called for RX clarification. Per Dr Fayrene FearingJames' note, patient to take Meclizine TID until dizziness is gone for 24 hours. Information given to pharmacist.

## 2015-05-14 DIAGNOSIS — I10 Essential (primary) hypertension: Secondary | ICD-10-CM | POA: Diagnosis not present

## 2015-05-14 DIAGNOSIS — Z79899 Other long term (current) drug therapy: Secondary | ICD-10-CM | POA: Diagnosis not present

## 2015-05-14 DIAGNOSIS — Z125 Encounter for screening for malignant neoplasm of prostate: Secondary | ICD-10-CM | POA: Diagnosis not present

## 2015-05-14 DIAGNOSIS — F1020 Alcohol dependence, uncomplicated: Secondary | ICD-10-CM | POA: Diagnosis not present

## 2015-05-21 DIAGNOSIS — Z681 Body mass index (BMI) 19 or less, adult: Secondary | ICD-10-CM | POA: Diagnosis not present

## 2015-05-21 DIAGNOSIS — Z0001 Encounter for general adult medical examination with abnormal findings: Secondary | ICD-10-CM | POA: Diagnosis not present

## 2015-05-21 DIAGNOSIS — I10 Essential (primary) hypertension: Secondary | ICD-10-CM | POA: Diagnosis not present

## 2015-05-21 DIAGNOSIS — E871 Hypo-osmolality and hyponatremia: Secondary | ICD-10-CM | POA: Diagnosis not present

## 2015-05-21 DIAGNOSIS — F1021 Alcohol dependence, in remission: Secondary | ICD-10-CM | POA: Diagnosis not present

## 2015-09-20 DIAGNOSIS — Z79899 Other long term (current) drug therapy: Secondary | ICD-10-CM | POA: Diagnosis not present

## 2015-09-20 DIAGNOSIS — I10 Essential (primary) hypertension: Secondary | ICD-10-CM | POA: Diagnosis not present

## 2015-09-27 DIAGNOSIS — F1021 Alcohol dependence, in remission: Secondary | ICD-10-CM | POA: Diagnosis not present

## 2015-09-27 DIAGNOSIS — Z681 Body mass index (BMI) 19 or less, adult: Secondary | ICD-10-CM | POA: Diagnosis not present

## 2015-09-27 DIAGNOSIS — E871 Hypo-osmolality and hyponatremia: Secondary | ICD-10-CM | POA: Diagnosis not present

## 2015-09-27 DIAGNOSIS — I10 Essential (primary) hypertension: Secondary | ICD-10-CM | POA: Diagnosis not present

## 2015-09-27 DIAGNOSIS — Z23 Encounter for immunization: Secondary | ICD-10-CM | POA: Diagnosis not present

## 2015-11-07 ENCOUNTER — Emergency Department (HOSPITAL_COMMUNITY): Payer: Medicare Other

## 2015-11-07 ENCOUNTER — Encounter (HOSPITAL_COMMUNITY): Payer: Self-pay

## 2015-11-07 ENCOUNTER — Emergency Department (HOSPITAL_COMMUNITY)
Admission: EM | Admit: 2015-11-07 | Discharge: 2015-11-07 | Disposition: A | Discharge status: Home / Self Care | Payer: Medicare Other | Attending: Emergency Medicine | Admitting: Emergency Medicine

## 2015-11-07 DIAGNOSIS — Y998 Other external cause status: Secondary | ICD-10-CM | POA: Insufficient documentation

## 2015-11-07 DIAGNOSIS — Z79899 Other long term (current) drug therapy: Secondary | ICD-10-CM | POA: Insufficient documentation

## 2015-11-07 DIAGNOSIS — E871 Hypo-osmolality and hyponatremia: Secondary | ICD-10-CM | POA: Diagnosis not present

## 2015-11-07 DIAGNOSIS — Y92002 Bathroom of unspecified non-institutional (private) residence single-family (private) house as the place of occurrence of the external cause: Secondary | ICD-10-CM | POA: Insufficient documentation

## 2015-11-07 DIAGNOSIS — Y9389 Activity, other specified: Secondary | ICD-10-CM | POA: Diagnosis not present

## 2015-11-07 DIAGNOSIS — S0990XA Unspecified injury of head, initial encounter: Secondary | ICD-10-CM | POA: Diagnosis present

## 2015-11-07 DIAGNOSIS — R51 Headache: Secondary | ICD-10-CM | POA: Diagnosis not present

## 2015-11-07 DIAGNOSIS — W01198A Fall on same level from slipping, tripping and stumbling with subsequent striking against other object, initial encounter: Secondary | ICD-10-CM | POA: Diagnosis not present

## 2015-11-07 DIAGNOSIS — Y908 Blood alcohol level of 240 mg/100 ml or more: Secondary | ICD-10-CM | POA: Diagnosis not present

## 2015-11-07 DIAGNOSIS — W19XXXA Unspecified fall, initial encounter: Secondary | ICD-10-CM

## 2015-11-07 DIAGNOSIS — F10120 Alcohol abuse with intoxication, uncomplicated: Secondary | ICD-10-CM | POA: Insufficient documentation

## 2015-11-07 DIAGNOSIS — Y92009 Unspecified place in unspecified non-institutional (private) residence as the place of occurrence of the external cause: Secondary | ICD-10-CM

## 2015-11-07 DIAGNOSIS — F17200 Nicotine dependence, unspecified, uncomplicated: Secondary | ICD-10-CM | POA: Insufficient documentation

## 2015-11-07 DIAGNOSIS — M542 Cervicalgia: Secondary | ICD-10-CM | POA: Diagnosis not present

## 2015-11-07 DIAGNOSIS — I10 Essential (primary) hypertension: Secondary | ICD-10-CM | POA: Insufficient documentation

## 2015-11-07 DIAGNOSIS — F10920 Alcohol use, unspecified with intoxication, uncomplicated: Secondary | ICD-10-CM

## 2015-11-07 DIAGNOSIS — F10220 Alcohol dependence with intoxication, uncomplicated: Secondary | ICD-10-CM | POA: Diagnosis not present

## 2015-11-07 DIAGNOSIS — S0101XA Laceration without foreign body of scalp, initial encounter: Secondary | ICD-10-CM | POA: Diagnosis not present

## 2015-11-07 HISTORY — DX: Dizziness and giddiness: R42

## 2015-11-07 HISTORY — DX: Alcohol abuse, uncomplicated: F10.10

## 2015-11-07 HISTORY — DX: Dependence on other enabling machines and devices: Z99.89

## 2015-11-07 LAB — CBC WITH DIFFERENTIAL/PLATELET
BASOS PCT: 1 %
Basophils Absolute: 0 10*3/uL (ref 0.0–0.1)
EOS ABS: 0.5 10*3/uL (ref 0.0–0.7)
Eosinophils Relative: 8 %
HCT: 37.3 % — ABNORMAL LOW (ref 39.0–52.0)
Hemoglobin: 13.2 g/dL (ref 13.0–17.0)
LYMPHS ABS: 1.7 10*3/uL (ref 0.7–4.0)
Lymphocytes Relative: 27 %
MCH: 30.1 pg (ref 26.0–34.0)
MCHC: 35.4 g/dL (ref 30.0–36.0)
MCV: 85.2 fL (ref 78.0–100.0)
MONO ABS: 0.5 10*3/uL (ref 0.1–1.0)
MONOS PCT: 8 %
Neutro Abs: 3.4 10*3/uL (ref 1.7–7.7)
Neutrophils Relative %: 56 %
Platelets: 206 10*3/uL (ref 150–400)
RBC: 4.38 MIL/uL (ref 4.22–5.81)
RDW: 12.9 % (ref 11.5–15.5)
WBC: 6.1 10*3/uL (ref 4.0–10.5)

## 2015-11-07 LAB — COMPREHENSIVE METABOLIC PANEL
ALBUMIN: 4.2 g/dL (ref 3.5–5.0)
ALK PHOS: 42 U/L (ref 38–126)
ALT: 14 U/L — ABNORMAL LOW (ref 17–63)
ANION GAP: 10 (ref 5–15)
AST: 23 U/L (ref 15–41)
BUN: <5 mg/dL — ABNORMAL LOW (ref 6–20)
CHLORIDE: 91 mmol/L — AB (ref 101–111)
CO2: 26 mmol/L (ref 22–32)
Calcium: 9.1 mg/dL (ref 8.9–10.3)
Creatinine, Ser: 0.62 mg/dL (ref 0.61–1.24)
GFR calc non Af Amer: >60 mL/min (ref >60)
GLUCOSE: 102 mg/dL — AB (ref 65–99)
POTASSIUM: 3.1 mmol/L — AB (ref 3.5–5.1)
SODIUM: 127 mmol/L — AB (ref 135–145)
Total Bilirubin: 0.5 mg/dL (ref 0.3–1.2)
Total Protein: 7.7 g/dL (ref 6.5–8.1)

## 2015-11-07 LAB — ETHANOL: Alcohol, Ethyl (B): 268 mg/dL — ABNORMAL HIGH (ref <5)

## 2015-11-07 NOTE — ED Notes (Signed)
Patient reports failing at home about 30 minutes ago per wife patient "blacked out for about 5 mins", EMS called but patient refused transport. Wife states pt has indulged in ETOH tonight

## 2015-11-07 NOTE — Discharge Instructions (Signed)
°Emergency Department Resource Guide °1) Find a Doctor and Pay Out of Pocket °Although you won't have to find out who is covered by your insurance plan, it is a good idea to ask around and get recommendations. You will then need to call the office and see if the doctor you have chosen will accept you as a new patient and what types of options they offer for patients who are self-pay. Some doctors offer discounts or will set up payment plans for their patients who do not have insurance, but you will need to ask so you aren't surprised when you get to your appointment. ° °2) Contact Your Local Health Department °Not all health departments have doctors that can see patients for sick visits, but many do, so it is worth a call to see if yours does. If you don't know where your local health department is, you can check in your phone book. The CDC also has a tool to help you locate your state's health department, and many state websites also have listings of all of their local health departments. ° °3) Find a Walk-in Clinic °If your illness is not likely to be very severe or complicated, you may want to try a walk in clinic. These are popping up all over the country in pharmacies, drugstores, and shopping centers. They're usually staffed by nurse practitioners or physician assistants that have been trained to treat common illnesses and complaints. They're usually fairly quick and inexpensive. However, if you have serious medical issues or chronic medical problems, these are probably not your best option. ° °No Primary Care Doctor: °- Call [facility] at  832-8000 - they can help you locate a primary care doctor that  accepts your insurance, provides certain services, etc. °- Physician Referral Service- 1-800-533-3463 ° °Chronic Pain Problems: °Organization         Address  Phone   Notes  °[facility]  (336) 297-2271 Patients need to be referred by their primary care doctor.  ° °Medication  Assistance: °Organization         Address  Phone   Notes  °[place] [facility] [address] °[place], [place] [address] (336) 641-8030 --Must be a resident of [place] °-- Must have NO insurance coverage whatsoever (no Medicaid/ Medicare, etc.) °-- The pt. MUST have a primary care doctor that directs their care regularly and follows them in the community °  °[facility]  (866) 331-1348   °[employer]  (888) 892-1162   ° °Agencies that provide inexpensive medical care: °Organization         Address  Phone   Notes  °[facility]  (336) 832-8035   °[facility]    (336) 832-7272   °[facility] [address] °[place], [place] [address] (336) 832-4777   °[facility] [address], °[place] (336) 271-4999   °[facility]    (336) 373-0678   °[facility]    (336) 272-1050   °[facility] ° [address], [place] Phone:  (336) 832-4444, Fax:  (336) 832-4440 Hours of Operation:  9 am - 6 pm, M-F.  Also accepts Medicaid/Medicare and self-pay.  °[facility] ° [address], [place] Phone: (336) 832-3150, Fax: (336) [phone]. Hours of Operation:  8:30 am - 5:30 pm, M-F.  Also accepts Medicaid and self-pay.  °[facility] [place] [address], [place] Phone: (336) 878-6027   °[facility] [address], [place], [place] (336)723-1848, Ext. 123 Mondays & Thursdays: 7-9 AM.  First 15 patients are seen on a first come, first serve basis. °  ° °Medicaid-accepting [place] Providers: ° °Organization         Address  Phone   Notes  °[facility] [address], [place] (336) 641-2100 Also accepts self-pay patients.  °[facility] [address], [place] ° (336) 856-9996   °[facility] [address], [place]  (336) 288-8857   °[facility] [address] [place] [address], [place] (336) 299-7000   °Veita Bland [address], [place]  ° (336) 373-1557 Only accepts [place] Access Medicaid patients after they have their name applied to their card.  ° °Self-Pay (no insurance) in [place]: ° °Organization         Address  Phone   Notes  °Sickle Cell Patients, [facility] [address], [place] (336) 832-1970   °[facility] [address], [place] (336) 832-4400   °[facility] ° [address], [place] (336) 992-4800   °[facility]/Dr. Osei-Bonsu ° [address] [place] [address], [place] or [address], [place] (336) 841-8500 Phone number for both [place] and [place] locations is the same.  °[facility] [address], [place] (336) 299-0000   °[facility] [address] [place] [address], [place] or [address] (336) 852-7530 °(336) 878-2260   °[facility] [address], [place] (336) 350-1642, phone; (336) 294-5005, fax Sees patients 1st and 3rd Saturday of every month.  Must not qualify for public or private insurance (i.e. Medicaid, Medicare, [place] Health Choice, Veterans' Benefits) • Household income should be no more than 200% of the poverty level •The clinic cannot treat you if you are pregnant or think you are pregnant • Sexually transmitted diseases are not treated at the clinic.  ° ° °Dental Care: °Organization         Address  Phone  Notes  °[place] [facility] [address], [place] (336) 641-6152 Accepts children up to age 21 who are enrolled in Medicaid or [place] Health Choice; pregnant women with a Medicaid card; and children who have applied for Medicaid or [place] Health Choice, but were declined, whose parents can pay a reduced fee at time of service.  °[place]  [facility] [place]  [address], [place] (336) 641-7733 Accepts children up to age 21 who are enrolled in Medicaid or [place] Health Choice; pregnant women with a Medicaid card; and children who have applied for Medicaid or [place] Health Choice, but were declined, whose parents can pay a reduced fee at time of service.  °[facility] ° [address], [place] (336) 641-4533 Patients are seen by appointment only. Walk-ins are not accepted. [facility] will see patients 18 years of age and older. °Monday - Tuesday (8am-5pm) °Most Wednesdays (8:30-5pm) °$30 per visit, cash only  °[facility] ° [address], [place] (336) 641-4533 Patients are seen by appointment only. Walk-ins are not accepted. [facility] will see patients 18 years of age and older. °One   Wednesday Evening (Monthly: Volunteer Based).  $30 per visit, cash only  °[facility]  (919) 537-3737 for adults; Children under age 4, call Graduate Pediatric Dentistry at ([phone]) [phone]. Children aged 4-14, please call (919) [phone] to request a pediatric application. ° Dental services are provided in all areas of dental care including fillings, crowns and bridges, complete and partial dentures, implants, gum treatment, root canals, and extractions. Preventive care is also provided. Treatment is provided to both adults and children. °Patients are selected via a lottery and there is often a waiting list. °  °[facility] [address], °[place] ° (336) 763-8833 [email or web address] °  °[facility] [address], [place], [place] (336)723-1848, Ext. 123 Second and Fourth Thursday of each month, opens at 6:30 AM; Clinic ends at 9 AM.  Patients are seen on a first-come first-served basis, and a limited number are seen during each clinic.  ° °[facility] ° [address], [place], [place] (336) 723-7904    Eligibility Requirements °You must have lived in [place], [place], or [place] counties for at least the last three months. °  You cannot be eligible for state or federal sponsored healthcare insurance, including Veterans Administration, Medicaid, or Medicare. °  You generally cannot be eligible for healthcare insurance through your employer.  °  How to apply: °Eligibility screenings are held every Tuesday and Wednesday afternoon from 1:00 pm until 4:00 pm. You do not need an appointment for the interview!  °[facility] [address], [place], [place] 336-631-2330   °[facility]  336-342-8273   °[facility]  336-703-3100   °[facility]  336-570-6415   ° °Behavioral Health Resources in the Community: °Intensive Outpatient Programs °Organization         Address  Phone  Notes  °[facility] [address], [place], [place] 336-878-6098   °[facility] [address], [place], [place] 336-832-9800   °[facility] [address], [place], [place] ° 336-882-2125   °[facility] [address],  °[name], [place] 1-800-853-5163 or 336-641-4981   °Substance Abuse Resources °Organization         Address  Phone  Notes  °[facility]  336-882-2125   °[facility]  336-784-9470   °[facility]  336-285-9073   °[facility]  336-845-3988   °Residential & Outpatient Substance Abuse Program  1-800-659-3381   °Psychological Services °Organization         Address  Phone  Notes  °[facility]  336- 832-9600   °[facility]  336- 378-7881   °[facility] [address], [place] 1-800-853-5163 or 336-641-4981   ° °Mobile Crisis Teams °Organization         Address  Phone  Notes  °[facility], Mobile Crisis Care Unit  1-877-626-1772   °Assertive °[facility] ° [address]  [place], [place] 336-834-9664   °Sharon DeEsch [address] °[place] 336-554-5454   ° °Self-Help/Support Groups °Organization         Address  Phone             Notes  °[facility]  - variety of support groups  336- 373-1402 Call for more information  °Narcotics Anonymous (NA), [facility] [address], °[place]  2 meetings at this location  ° °  Residential Treatment Programs Organization         Address  Phone  Notes  [facility] [address],    [place]  5-409-811-91471-(573)[phone]   [facility]  [address], [place] [phone], [place], [place] [phone]   [facility] [address], [place] [phone] Admissions: 8am-3pm M-F  [facility] [address]    [place], [place] [phone]   [facility] [address], [place], [place] [phone]([phone]   [facility] [address]  [place], [place] [phone]   [facility] Intensive Outpatient [address], [place], [place] [phone]   [facility] [address] [place] [address]  [place], [place] [phone] or [phone]   [facility] [address] [place], [place] [phone](484)245-3392 Accepts Medicaid  [facility] [address]  [place] [phone] Substance Abuse/Addiction Treatment   [facility]         Address  Phone  Notes  [facility]  (605)270-4894(888) 316-686-7032   Angie FavaJulie Brannon, PhD [address], [address] [place], [place]   ([phone](336) [phone] or [phone](336) [phone]   [facility]   [address] [place], [place] [phone](336) ([phone]   [facility] [address], [place], [place] [phone](336) [phone] Insurance/Medicaid/sponsorship through [facility] [address]                                    [place], [place] ([phone]) [phone](336) [phone] Therapy/tele-psych/case    [facility] [address] [place].   [place], [place] ([phone](336) [phone]    Dr. Lolly MustacheArfeen  [phone](336) [phone]   [facility] [place]  [employer] [facility] 1) [address], [place] 2) [address], [place] 3)  [address], [place] [phone](336) [phone](336) ([phone]  [phone](336) [phone]   [facility] 959-068-0341(336) 331-430-3068 or 4347991858(336) [phone] (After Hours)      Take your usual prescriptions as previously directed.  Wash the area with soap and water at least twice a day, and cover with a clean/dry dressing.  Change the dressing whenever it becomes wet or soiled after washing the area with soap and water.  Call your regular medical doctor tomorrow to schedule a follow up appointment for a recheck within the next 24 to 48 hours and to have your staple removed in the next 7 to 10 days. Your sodium level was low today, as it has been on your previous lab values. Your family doctor will need to also follow up this finding. Return to the Emergency Department immediately if worsening.

## 2015-11-07 NOTE — ED Notes (Signed)
Per wife patients mental is at baseline, although hes "a little less alert than normal", patient denies any symptoms other than soreness of head at this time.

## 2015-11-07 NOTE — ED Provider Notes (Signed)
CSN: [id number]     Arrival date & time 11/07/15  2017 History   First MD Initiated Contact with Patient 11/07/15 2032     Chief Complaint  Patient presents with  . Fall  . Alcohol Intoxication      Patient is a 60 y.o. male presenting with fall and intoxication. The history is provided by the patient, the spouse and a relative. The history is limited by the condition of the patient (Intoxicated).  Fall  Alcohol Intoxication  Pt was seen at 2040. Per pt and his wife: Pt's wife states pt has "been drinking" today. Pt has fallen twice, the second time hitting his head on the toilet in the bathroom. Pt "blacked out" for a few minutes, and then had N/V. Pt's wife states pt heavily drinks etoh daily and has frequent falls. Pt refused transport by EMS, so pt's wife brought him to the ED for evaluation. Pt is acting per his baseline per his wife at bedside. Pt denies any complaints other than his head is "sore" where he hit it.     Past Medical History  Diagnosis Date  . Hypertension   . Alcohol abuse   . Vertigo   . Use of cane as ambulatory aid    Past Surgical History  Procedure Laterality Date  . Right ankle surgery    . Mouth surgery      Social History  Substance Use Topics  . Smoking status: Current Every Day Smoker  . Smokeless tobacco: None  . Alcohol Use: Yes     Comment: three 24 ozs per day    Review of Systems  Unable to perform ROS: Other    Allergies  Other  Home Medications   Prior to Admission medications   Medication Sig Start Date End Date Taking? Authorizing Provider  amLODipine (NORVASC) 10 MG tablet Take 10 mg by mouth daily.   Yes Historical Provider, MD  hydrochlorothiazide (HYDRODIURIL) 25 MG tablet Take 25 mg by mouth daily.   Yes Historical Provider, MD  losartan (COZAAR) 100 MG tablet Take 100 mg by mouth daily.   Yes Historical Provider, MD  HYDROcodone-acetaminophen (NORCO/VICODIN) 5-325 MG per tablet Take 1 tablet by mouth every 4 (four)  hours as needed. Patient not taking: Reported on 02/06/2015 12/22/13   Vickki Hearing, MD  meclizine (ANTIVERT) 25 MG tablet Take until 24 hours without dizziness 02/06/15   Rolland Porter, MD   BP 113/72 mmHg  Pulse 59  Temp(Src) 96.3 F (35.7 C) (Temporal)  Resp 18  Ht 5\' 8"  (1.727 m)  Wt 135 lb (61.236 kg)  BMI 20.53 kg/m2  SpO2 100%  BP 129/68 mmHg  Pulse 87  Temp(Src) 97.7 F (36.5 C) (Oral)  Resp 17  Ht 5\' 8"  (1.727 m)  Wt 135 lb (61.236 kg)  BMI 20.53 kg/m2  SpO2 100%  Physical Exam 2045: Physical examination:  Nursing notes reviewed; Vital signs and O2 SAT reviewed;  Constitutional: Well developed, Well nourished, Well hydrated, In no acute distress; Head:  Normocephalic, +1cm linear lac to vertex scalp.; Eyes: EOMI, PERRL, No scleral icterus; ENMT: Mouth and pharynx normal, Mucous membranes moist; Neck: Supple, Full range of motion, No lymphadenopathy; Cardiovascular: Regular rate and rhythm, No gallop; Respiratory: Breath sounds clear & equal bilaterally, No wheezes.  Speaking full sentences with ease, Normal respiratory effort/excursion; Chest: Nontender, Movement normal; Abdomen: Soft, Nontender, Nondistended, Normal bowel sounds; Genitourinary: No CVA tenderness; Spine:  No midline CS, TS, LS tenderness.;; Extremities: Pulses normal, No  tenderness, No edema, No calf edema or asymmetry.; Neuro: AA&Ox3, Major CN grossly intact.  Speech clear. No gross focal motor or sensory deficits in extremities.; Skin: Color normal, Warm, Dry.   ED Course  Procedures (including critical care time)  LACERATION REPAIR Performed by: Laray AngerMCMANUS,Dale Ribeiro M Authorized by: Laray AngerMCMANUS,Robbi Scurlock M Consent: Verbal consent obtained. Risks and benefits: risks, benefits and alternatives were discussed Consent given by: patient Patient identity confirmed: provided demographic data Prepped and Draped in normal sterile fashion Wound explored Laceration Location: vertex scalp Laceration Length: 1cm No  Foreign Bodies seen or palpated Anesthesia: none Irrigation method: 4x4 Amount of cleaning: standard Skin closure: staple Number of staples: 1 Patient tolerance: Patient tolerated the procedure well with no immediate complications.       Labs Review Imaging Review  I have personally reviewed and evaluated these images and lab results as part of my medical decision-making.   EKG Interpretation None      MDM  MDM Reviewed: previous chart, nursing note and vitals Reviewed previous: labs Interpretation: labs and CT scan     Results for orders placed or performed during the hospital encounter of 11/07/15  Comprehensive metabolic panel  Result Value Ref Range   Sodium 127 (L) 135 - 145 mmol/L   Potassium 3.1 (L) 3.5 - 5.1 mmol/L   Chloride 91 (L) 101 - 111 mmol/L   CO2 26 22 - 32 mmol/L   Glucose, Bld 102 (H) 65 - 99 mg/dL   BUN <5 (L) 6 - 20 mg/dL   Creatinine, Ser 1.470.62 0.61 - 1.24 mg/dL   Calcium 9.1 8.9 - 82.910.3 mg/dL   Total Protein 7.7 6.5 - 8.1 g/dL   Albumin 4.2 3.5 - 5.0 g/dL   AST 23 15 - 41 U/L   ALT 14 (L) 17 - 63 U/L   Alkaline Phosphatase 42 38 - 126 U/L   Total Bilirubin 0.5 0.3 - 1.2 mg/dL   GFR calc non Af Amer >60 >60 mL/min   GFR calc Af Amer >60 >60 mL/min   Anion gap 10 5 - 15  Ethanol  Result Value Ref Range   Alcohol, Ethyl (B) 268 (H) <5 mg/dL  CBC with Differential  Result Value Ref Range   WBC 6.1 4.0 - 10.5 K/uL   RBC 4.38 4.22 - 5.81 MIL/uL   Hemoglobin 13.2 13.0 - 17.0 g/dL   HCT 56.237.3 (L) 13.039.0 - 86.552.0 %   MCV 85.2 78.0 - 100.0 fL   MCH 30.1 26.0 - 34.0 pg   MCHC 35.4 30.0 - 36.0 g/dL   RDW 78.412.9 69.611.5 - 29.515.5 %   Platelets 206 150 - 400 K/uL   Neutrophils Relative % 56 %   Neutro Abs 3.4 1.7 - 7.7 K/uL   Lymphocytes Relative 27 %   Lymphs Abs 1.7 0.7 - 4.0 K/uL   Monocytes Relative 8 %   Monocytes Absolute 0.5 0.1 - 1.0 K/uL   Eosinophils Relative 8 %   Eosinophils Absolute 0.5 0.0 - 0.7 K/uL   Basophils Relative 1 %    Basophils Absolute 0.0 0.0 - 0.1 K/uL   Ct Head Wo Contrast 11/07/2015  CLINICAL DATA:  Pain following fall EXAM: CT HEAD WITHOUT CONTRAST CT CERVICAL SPINE WITHOUT CONTRAST TECHNIQUE: Multidetector CT imaging of the head and cervical spine was performed following the standard protocol without intravenous contrast. Multiplanar CT image reconstructions of the cervical spine were also generated. COMPARISON:  Head CT February 06, 2015 FINDINGS: CT HEAD FINDINGS Mild diffuse atrophy is  stable. There is no intracranial mass, hemorrhage, extra-axial fluid collection, or midline shift. There is small vessel disease throughout the centra semiovale bilaterally. There is small vessel disease throughout the internal and external capsules anteriorly on both sides. Elsewhere gray-white compartments appear normal. No acute infarct is evident. The bony calvarium appears intact. The mastoid air cells are clear. The visualized orbits appear unremarkable bilaterally. CT CERVICAL SPINE FINDINGS There is no fracture or spondylolisthesis. Prevertebral soft tissues and predental space regions are normal. There is slight disc space narrowing at C5-6 and C6-7. There is mild facet hypertrophy at several levels bilaterally. No disc extrusion or stenosis. There is bullous disease in the apices bilaterally. There is extensive calcification in both carotid arteries, more pronounced on the left than on the right. IMPRESSION: CT head: Atrophy with periventricular small vessel disease, stable. There is also small vessel disease in the anterior limbs of the internal and external capsules bilaterally. No intracranial mass, hemorrhage, or evidence of acute infarct. CT cervical spine: No fracture or spondylolisthesis. Mild osteoarthritic change. Extensive carotid artery calcification bilaterally, more severe on the left than on the right. Apical bullous disease bilaterally, more on the right than on the left. Electronically Signed   By: Bretta Bang III M.D.   On: 11/07/2015 22:21   Ct Cervical Spine Wo Contrast 11/07/2015  CLINICAL DATA:  Pain following fall EXAM: CT HEAD WITHOUT CONTRAST CT CERVICAL SPINE WITHOUT CONTRAST TECHNIQUE: Multidetector CT imaging of the head and cervical spine was performed following the standard protocol without intravenous contrast. Multiplanar CT image reconstructions of the cervical spine were also generated. COMPARISON:  Head CT February 06, 2015 FINDINGS: CT HEAD FINDINGS Mild diffuse atrophy is stable. There is no intracranial mass, hemorrhage, extra-axial fluid collection, or midline shift. There is small vessel disease throughout the centra semiovale bilaterally. There is small vessel disease throughout the internal and external capsules anteriorly on both sides. Elsewhere gray-white compartments appear normal. No acute infarct is evident. The bony calvarium appears intact. The mastoid air cells are clear. The visualized orbits appear unremarkable bilaterally. CT CERVICAL SPINE FINDINGS There is no fracture or spondylolisthesis. Prevertebral soft tissues and predental space regions are normal. There is slight disc space narrowing at C5-6 and C6-7. There is mild facet hypertrophy at several levels bilaterally. No disc extrusion or stenosis. There is bullous disease in the apices bilaterally. There is extensive calcification in both carotid arteries, more pronounced on the left than on the right. IMPRESSION: CT head: Atrophy with periventricular small vessel disease, stable. There is also small vessel disease in the anterior limbs of the internal and external capsules bilaterally. No intracranial mass, hemorrhage, or evidence of acute infarct. CT cervical spine: No fracture or spondylolisthesis. Mild osteoarthritic change. Extensive carotid artery calcification bilaterally, more severe on the left than on the right. Apical bullous disease bilaterally, more on the right than on the left. Electronically Signed   By:  Bretta Bang III M.D.   On: 11/07/2015 22:21    2300:  Lac closed with 1 staple. Hyponatremic per baseline labs for the past several years. Family feels pt is acting his baseline and would like to take him home now. Dx and testing d/w pt and family.  Questions answered.  Verb understanding, agreeable to d/c home with outpt f/u.    Samuel Jester, DO 11/10/15 1652

## 2015-11-12 DIAGNOSIS — S0101XA Laceration without foreign body of scalp, initial encounter: Secondary | ICD-10-CM | POA: Diagnosis not present

## 2015-11-12 DIAGNOSIS — Z681 Body mass index (BMI) 19 or less, adult: Secondary | ICD-10-CM | POA: Diagnosis not present

## 2015-11-12 DIAGNOSIS — F1021 Alcohol dependence, in remission: Secondary | ICD-10-CM | POA: Diagnosis not present

## 2016-01-13 IMAGING — CR DG SHOULDER 2+V*L*
3 series · 3 of 3 positions shown · non-contrast
Comparison: None.

CLINICAL DATA: Fall.

EXAM:
LEFT SHOULDER - 2+ VIEW

[view not recorded (1 of 3)]
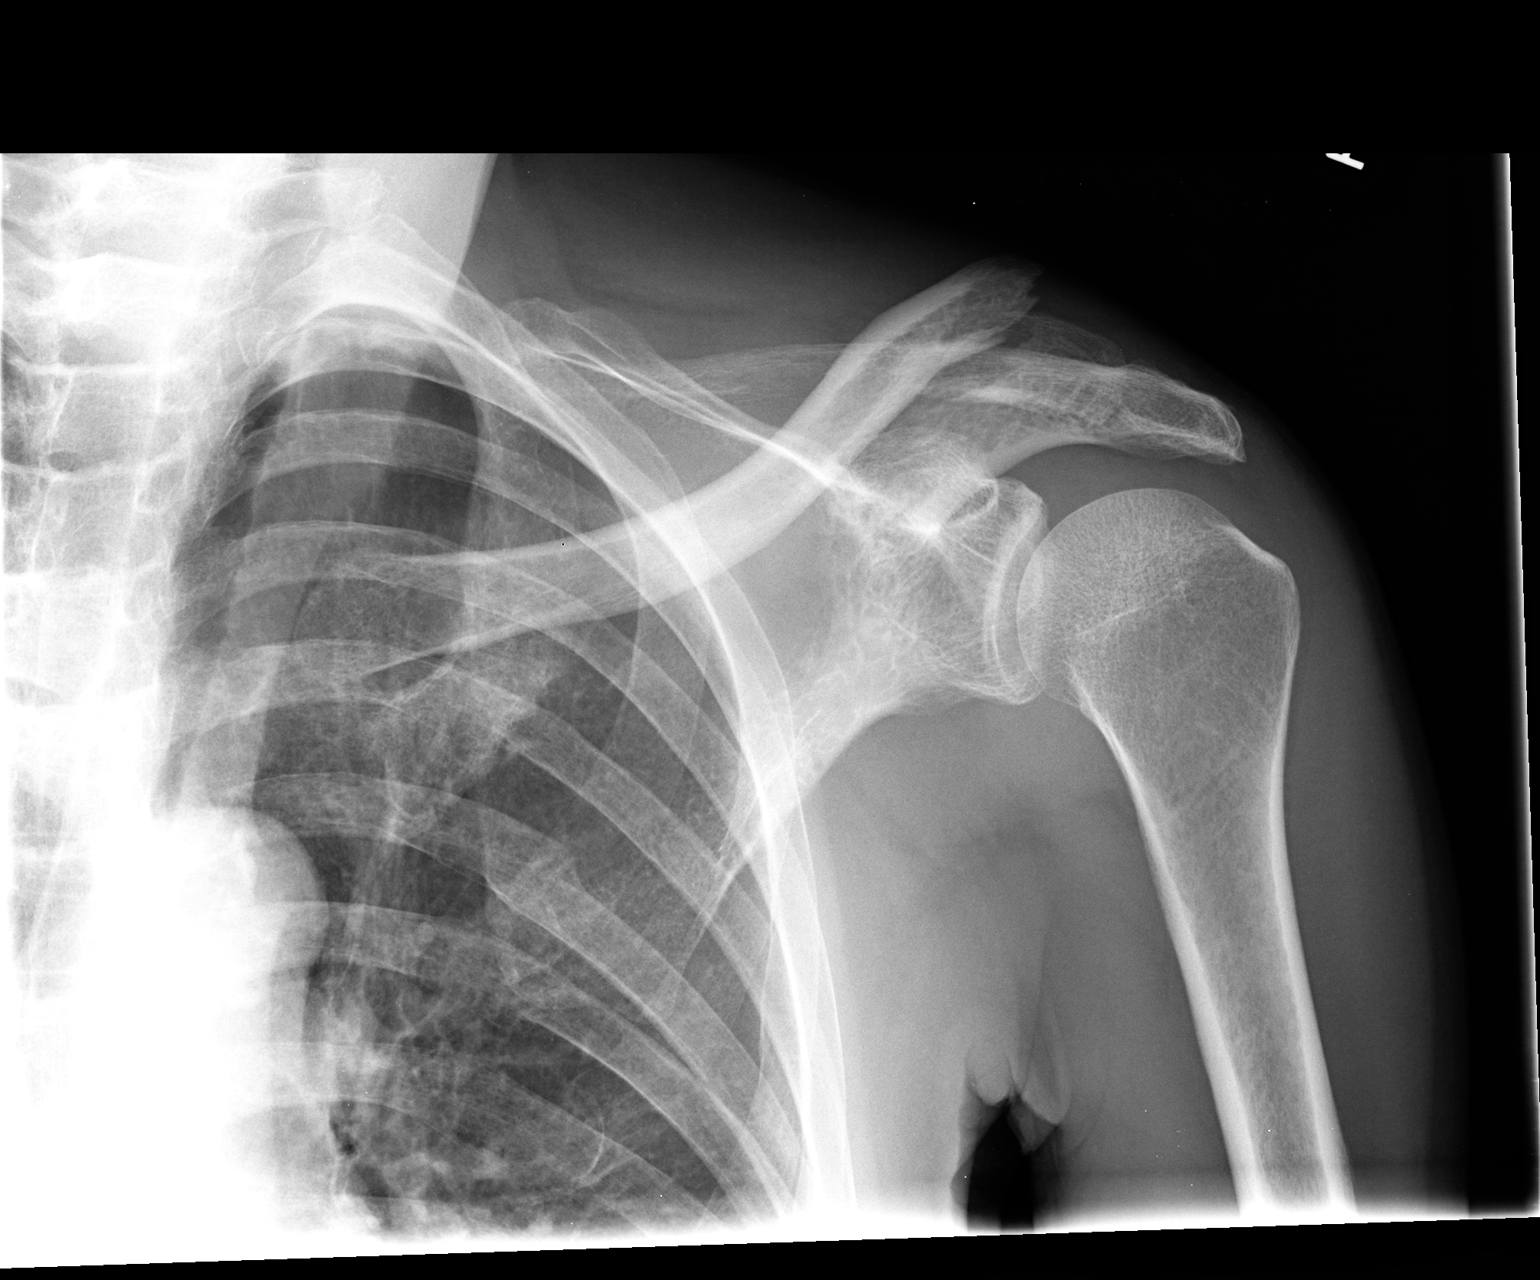

[view not recorded (2 of 3)]
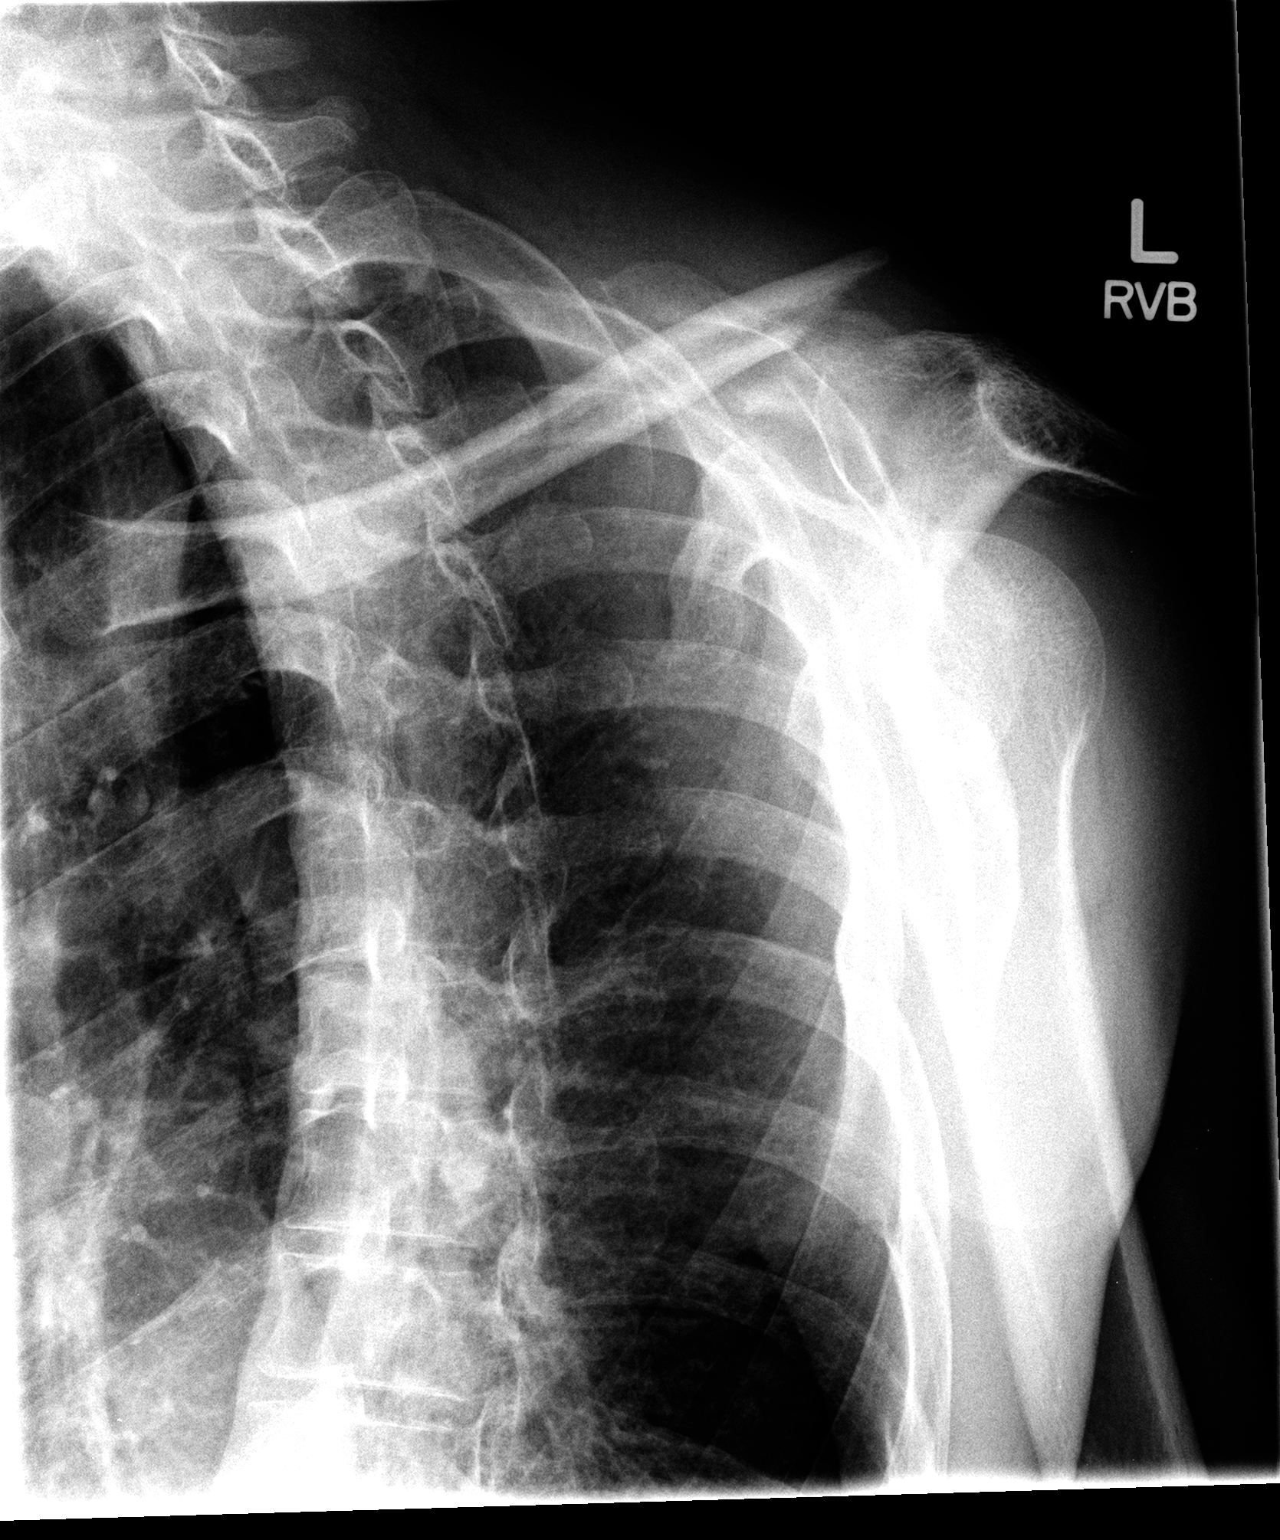

[view not recorded (3 of 3)]
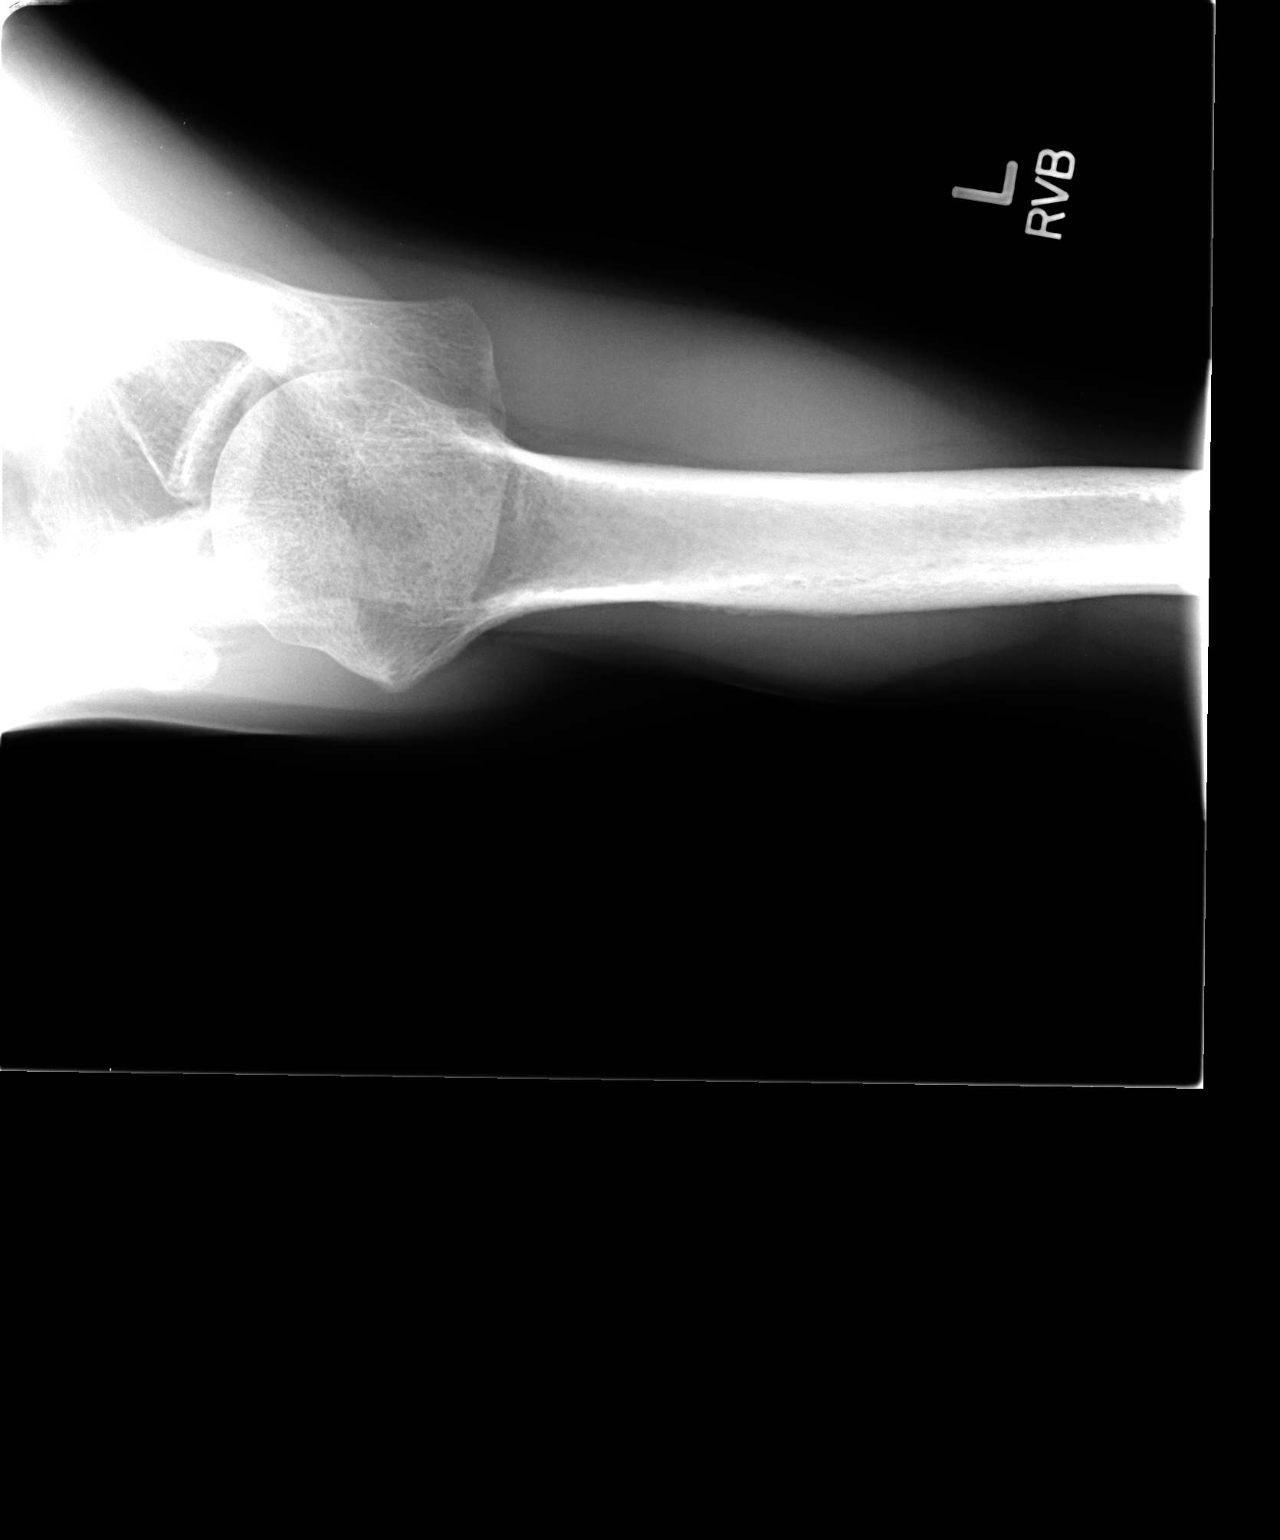

[3 of 3 positions shown; findings below may reference images not displayed]

FINDINGS: There is a displaced distal left clavicle fracture. There is
approximately 1 shaft width of superior displacement of the proximal
fragment. There are several old left posterior rib fractures.
IMPRESSION: Displaced distal left clavicle fracture.

## 2016-01-25 DIAGNOSIS — E871 Hypo-osmolality and hyponatremia: Secondary | ICD-10-CM | POA: Diagnosis not present

## 2016-01-25 DIAGNOSIS — Z79899 Other long term (current) drug therapy: Secondary | ICD-10-CM | POA: Diagnosis not present

## 2016-01-25 DIAGNOSIS — I10 Essential (primary) hypertension: Secondary | ICD-10-CM | POA: Diagnosis not present

## 2016-01-25 DIAGNOSIS — F1020 Alcohol dependence, uncomplicated: Secondary | ICD-10-CM | POA: Diagnosis not present

## 2016-01-31 DIAGNOSIS — I10 Essential (primary) hypertension: Secondary | ICD-10-CM | POA: Diagnosis not present

## 2016-01-31 DIAGNOSIS — E871 Hypo-osmolality and hyponatremia: Secondary | ICD-10-CM | POA: Diagnosis not present

## 2016-05-26 DIAGNOSIS — N529 Male erectile dysfunction, unspecified: Secondary | ICD-10-CM | POA: Diagnosis not present

## 2016-05-26 DIAGNOSIS — Z79899 Other long term (current) drug therapy: Secondary | ICD-10-CM | POA: Diagnosis not present

## 2016-05-26 DIAGNOSIS — I10 Essential (primary) hypertension: Secondary | ICD-10-CM | POA: Diagnosis not present

## 2016-05-26 DIAGNOSIS — Z125 Encounter for screening for malignant neoplasm of prostate: Secondary | ICD-10-CM | POA: Diagnosis not present

## 2016-05-26 DIAGNOSIS — F1020 Alcohol dependence, uncomplicated: Secondary | ICD-10-CM | POA: Diagnosis not present

## 2016-05-26 DIAGNOSIS — E871 Hypo-osmolality and hyponatremia: Secondary | ICD-10-CM | POA: Diagnosis not present

## 2016-08-10 DIAGNOSIS — Z23 Encounter for immunization: Secondary | ICD-10-CM | POA: Diagnosis not present

## 2016-08-10 DIAGNOSIS — E871 Hypo-osmolality and hyponatremia: Secondary | ICD-10-CM | POA: Diagnosis not present

## 2016-08-10 DIAGNOSIS — I10 Essential (primary) hypertension: Secondary | ICD-10-CM | POA: Diagnosis not present

## 2016-12-08 DIAGNOSIS — Z79899 Other long term (current) drug therapy: Secondary | ICD-10-CM | POA: Diagnosis not present

## 2016-12-08 DIAGNOSIS — I10 Essential (primary) hypertension: Secondary | ICD-10-CM | POA: Diagnosis not present

## 2016-12-15 DIAGNOSIS — I10 Essential (primary) hypertension: Secondary | ICD-10-CM | POA: Diagnosis not present

## 2016-12-15 DIAGNOSIS — E871 Hypo-osmolality and hyponatremia: Secondary | ICD-10-CM | POA: Diagnosis not present

## 2016-12-15 DIAGNOSIS — Z6820 Body mass index (BMI) 20.0-20.9, adult: Secondary | ICD-10-CM | POA: Diagnosis not present

## 2017-04-10 DIAGNOSIS — Z79899 Other long term (current) drug therapy: Secondary | ICD-10-CM | POA: Diagnosis not present

## 2017-04-10 DIAGNOSIS — I10 Essential (primary) hypertension: Secondary | ICD-10-CM | POA: Diagnosis not present

## 2017-04-10 DIAGNOSIS — E871 Hypo-osmolality and hyponatremia: Secondary | ICD-10-CM | POA: Diagnosis not present

## 2017-04-27 DIAGNOSIS — I10 Essential (primary) hypertension: Secondary | ICD-10-CM | POA: Diagnosis not present

## 2017-04-27 DIAGNOSIS — E871 Hypo-osmolality and hyponatremia: Secondary | ICD-10-CM | POA: Diagnosis not present

## 2017-04-27 DIAGNOSIS — Z6820 Body mass index (BMI) 20.0-20.9, adult: Secondary | ICD-10-CM | POA: Diagnosis not present

## 2017-08-31 DIAGNOSIS — I10 Essential (primary) hypertension: Secondary | ICD-10-CM | POA: Diagnosis not present

## 2017-08-31 DIAGNOSIS — Z79899 Other long term (current) drug therapy: Secondary | ICD-10-CM | POA: Diagnosis not present

## 2017-08-31 DIAGNOSIS — Z125 Encounter for screening for malignant neoplasm of prostate: Secondary | ICD-10-CM | POA: Diagnosis not present

## 2017-08-31 DIAGNOSIS — E871 Hypo-osmolality and hyponatremia: Secondary | ICD-10-CM | POA: Diagnosis not present

## 2017-10-03 DIAGNOSIS — Z23 Encounter for immunization: Secondary | ICD-10-CM | POA: Diagnosis not present

## 2017-10-03 DIAGNOSIS — I10 Essential (primary) hypertension: Secondary | ICD-10-CM | POA: Diagnosis not present

## 2017-10-03 DIAGNOSIS — E871 Hypo-osmolality and hyponatremia: Secondary | ICD-10-CM | POA: Diagnosis not present

## 2018-01-30 DIAGNOSIS — E871 Hypo-osmolality and hyponatremia: Secondary | ICD-10-CM | POA: Diagnosis not present

## 2018-01-30 DIAGNOSIS — I10 Essential (primary) hypertension: Secondary | ICD-10-CM | POA: Diagnosis not present

## 2018-01-30 DIAGNOSIS — Z79899 Other long term (current) drug therapy: Secondary | ICD-10-CM | POA: Diagnosis not present

## 2018-02-06 DIAGNOSIS — E871 Hypo-osmolality and hyponatremia: Secondary | ICD-10-CM | POA: Diagnosis not present

## 2018-02-06 DIAGNOSIS — Z6820 Body mass index (BMI) 20.0-20.9, adult: Secondary | ICD-10-CM | POA: Diagnosis not present

## 2018-02-06 DIAGNOSIS — I10 Essential (primary) hypertension: Secondary | ICD-10-CM | POA: Diagnosis not present

## 2018-05-29 DIAGNOSIS — E871 Hypo-osmolality and hyponatremia: Secondary | ICD-10-CM | POA: Diagnosis not present

## 2018-05-29 DIAGNOSIS — Z79899 Other long term (current) drug therapy: Secondary | ICD-10-CM | POA: Diagnosis not present

## 2018-05-29 DIAGNOSIS — I10 Essential (primary) hypertension: Secondary | ICD-10-CM | POA: Diagnosis not present

## 2018-06-03 ENCOUNTER — Encounter: Payer: Self-pay | Admitting: Internal Medicine

## 2018-06-05 DIAGNOSIS — E871 Hypo-osmolality and hyponatremia: Secondary | ICD-10-CM | POA: Diagnosis not present

## 2018-06-05 DIAGNOSIS — I10 Essential (primary) hypertension: Secondary | ICD-10-CM | POA: Diagnosis not present

## 2018-10-02 DIAGNOSIS — I10 Essential (primary) hypertension: Secondary | ICD-10-CM | POA: Diagnosis not present

## 2018-10-02 DIAGNOSIS — Z79899 Other long term (current) drug therapy: Secondary | ICD-10-CM | POA: Diagnosis not present

## 2018-10-02 DIAGNOSIS — E871 Hypo-osmolality and hyponatremia: Secondary | ICD-10-CM | POA: Diagnosis not present

## 2018-10-09 DIAGNOSIS — Z23 Encounter for immunization: Secondary | ICD-10-CM | POA: Diagnosis not present

## 2018-10-09 DIAGNOSIS — I10 Essential (primary) hypertension: Secondary | ICD-10-CM | POA: Diagnosis not present

## 2018-10-09 DIAGNOSIS — E871 Hypo-osmolality and hyponatremia: Secondary | ICD-10-CM | POA: Diagnosis not present

## 2018-10-31 ENCOUNTER — Ambulatory Visit: Payer: Medicare Other

## 2018-12-31 ENCOUNTER — Ambulatory Visit (INDEPENDENT_AMBULATORY_CARE_PROVIDER_SITE_OTHER): Payer: Medicare Other | Admitting: Gastroenterology

## 2018-12-31 ENCOUNTER — Encounter: Payer: Self-pay | Admitting: Gastroenterology

## 2018-12-31 DIAGNOSIS — Z1211 Encounter for screening for malignant neoplasm of colon: Secondary | ICD-10-CM | POA: Diagnosis not present

## 2018-12-31 NOTE — Progress Notes (Signed)
CC'D TO PCP °

## 2018-12-31 NOTE — Patient Instructions (Signed)
Collect stool for Cologuard to screen for colon cancer. If positive you will need to have a colonoscopy.

## 2018-12-31 NOTE — Assessment & Plan Note (Signed)
Patient declining colonoscopy at this time for various reasons.  He understands that colonoscopy is best test for colon cancer screening, prevention.  We discussed Cologuard testing including advantages/disadvantages (false positive/false negative rates) and he prefers pursuing Cologuard as opposed to colonoscopy at this time.  He reports that he will comply with colonoscopy IF his Cologuard is positive.

## 2018-12-31 NOTE — Progress Notes (Signed)
Primary Care Physician:  Carylon Perches, MD  Primary Gastroenterologist:  Roetta Sessions, MD   Chief Complaint  Patient presents with  . Colonoscopy    HPI:  Kenneth Lester is a 63 y.o. male here to schedule 10-year screening colonoscopy.  His last colonoscopy was in 2009, he had a single hyperplastic polyp removed.  Patient's prep was difficult because he ate solid food prior to his procedure.  Prior history of significant alcohol use, reports occasional use now.  No FH colon cancer. No abdominal pain, constipation, diarrhea, melena, brbpr. No heartburn. No dysphagia. Currently he is not interested in pursuing a colonoscopy. He doesn't like the bowel prep, diet, and doesn't want to have his wife have to take off work. We discussed reasoning behind colonoscopy for colon cancer prevention. He understands but asks for alternatives.    Current Outpatient Medications  Medication Sig Dispense Refill  . amLODipine (NORVASC) 10 MG tablet Take 10 mg by mouth daily.    . hydrochlorothiazide (HYDRODIURIL) 25 MG tablet Take 25 mg by mouth daily.    Marland Kitchen losartan (COZAAR) 50 MG tablet Take 1 tablet by mouth daily.     No current facility-administered medications for this visit.     Allergies as of 12/31/2018 - Review Complete 12/31/2018  Allergen Reaction Noted  . Other Other (See Comments) 12/22/2013    Past Medical History:  Diagnosis Date  . Alcohol abuse   . Hypertension   . Use of cane as ambulatory aid   . Vertigo     Past Surgical History:  Procedure Laterality Date  . COLONOSCOPY  2009   Dr. Jena Gauss: difficult prep (patient ate solid food during prep). single hyperplastic polyps removed.  Marland Kitchen MOUTH SURGERY    . right ankle surgery     forklift injury    Family History  Problem Relation Age of Onset  . Colon cancer Neg Hx     Social History   Socioeconomic History  . Marital status: Married    Spouse name: Not on file  . Number of children: Not on file  . Years of education:  Not on file  . Highest education level: Not on file  Occupational History  . Not on file  Social Needs  . Financial resource strain: Not on file  . Food insecurity:    Worry: Not on file    Inability: Not on file  . Transportation needs:    Medical: Not on file    Non-medical: Not on file  Tobacco Use  . Smoking status: Current Every Day Smoker  . Smokeless tobacco: Never Used  Substance and Sexual Activity  . Alcohol use: Yes    Comment: previously three 24 ozs per day; 12/31/18 beer occ  . Drug use: No  . Sexual activity: Not on file  Lifestyle  . Physical activity:    Days per week: Not on file    Minutes per session: Not on file  . Stress: Not on file  Relationships  . Social connections:    Talks on phone: Not on file    Gets together: Not on file    Attends religious service: Not on file    Active member of club or organization: Not on file    Attends meetings of clubs or organizations: Not on file    Relationship status: Not on file  . Intimate partner violence:    Fear of current or ex partner: Not on file    Emotionally abused: Not on file  Physically abused: Not on file    Forced sexual activity: Not on file  Other Topics Concern  . Not on file  Social History Narrative  . Not on file      ROS:  General: Negative for anorexia, weight loss, fever, chills, fatigue, weakness. Eyes: Negative for vision changes.  ENT: Negative for hoarseness, difficulty swallowing , nasal congestion. CV: Negative for chest pain, angina, palpitations, dyspnea on exertion, peripheral edema.  Respiratory: Negative for dyspnea at rest, dyspnea on exertion, cough, sputum, wheezing.  GI: See history of present illness. GU:  Negative for dysuria, hematuria, urinary incontinence, urinary frequency, nocturnal urination.  MS: right ankle pain, no low back pain.  Derm: Negative for rash or itching.  Neuro: Negative for weakness, abnormal sensation, seizure, frequent headaches,  memory loss, confusion.  Psych: Negative for anxiety, depression, suicidal ideation, hallucinations.  Endo: Negative for unusual weight change.  Heme: Negative for bruising or bleeding. Allergy: Negative for rash or hives.    Physical Examination:  BP 132/87   Pulse (!) 118   Temp 98.1 F (36.7 C) (Oral)   Ht 5\' 8"  (1.727 m)   Wt 125 lb 6.4 oz (56.9 kg)   BMI 19.07 kg/m    General: Well-nourished, well-developed in no acute distress.  Head: Normocephalic, atraumatic.   Eyes: Conjunctiva pink, no icterus. Mouth: Oropharyngeal mucosa moist and pink , no lesions erythema or exudate. Neck: Supple without thyromegaly, masses, or lymphadenopathy.  Lungs: Clear to auscultation bilaterally.  Heart: Regular rate and rhythm, no murmurs rubs or gallops.  Abdomen: Bowel sounds are normal, nontender, nondistended, no hepatosplenomegaly or masses, no abdominal bruits or    hernia , no rebound or guarding.   Rectal: not performed Extremities: No lower extremity edema. No clubbing or deformities.  Neuro: Alert and oriented x 4 , grossly normal neurologically.  Skin: Warm and dry, no rash or jaundice.   Psych: Alert and cooperative, normal mood and affect.

## 2019-02-04 DIAGNOSIS — E871 Hypo-osmolality and hyponatremia: Secondary | ICD-10-CM | POA: Diagnosis not present

## 2019-02-04 DIAGNOSIS — I10 Essential (primary) hypertension: Secondary | ICD-10-CM | POA: Diagnosis not present

## 2019-02-10 DIAGNOSIS — E871 Hypo-osmolality and hyponatremia: Secondary | ICD-10-CM | POA: Diagnosis not present

## 2019-02-10 DIAGNOSIS — I10 Essential (primary) hypertension: Secondary | ICD-10-CM | POA: Diagnosis not present

## 2019-06-18 DIAGNOSIS — E871 Hypo-osmolality and hyponatremia: Secondary | ICD-10-CM | POA: Diagnosis not present

## 2019-06-18 DIAGNOSIS — I10 Essential (primary) hypertension: Secondary | ICD-10-CM | POA: Diagnosis not present

## 2019-06-25 ENCOUNTER — Telehealth: Payer: Self-pay

## 2019-06-25 DIAGNOSIS — I10 Essential (primary) hypertension: Secondary | ICD-10-CM | POA: Diagnosis not present

## 2019-06-25 DIAGNOSIS — E871 Hypo-osmolality and hyponatremia: Secondary | ICD-10-CM | POA: Diagnosis not present

## 2019-06-25 NOTE — Telephone Encounter (Signed)
Received a call from [facility] from Dr. Ria Comment office. They called to see if pt did a cologuard test. I called [employer] and they didn't have record of anything from the pt. Dr. Ria Comment office is aware.

## 2019-06-25 NOTE — Telephone Encounter (Signed)
Noted  

## 2019-06-25 NOTE — Telephone Encounter (Addendum)
I ordered it in 12/2018 at time of OV. Forms were completed and faxed to [employer]. Apparently patient did not follow through.

## 2019-09-02 ENCOUNTER — Emergency Department (HOSPITAL_COMMUNITY): Payer: Medicare Other

## 2019-09-02 ENCOUNTER — Other Ambulatory Visit: Payer: Self-pay

## 2019-09-02 ENCOUNTER — Encounter (HOSPITAL_COMMUNITY): Payer: Self-pay

## 2019-09-02 ENCOUNTER — Emergency Department (HOSPITAL_COMMUNITY)
Admission: EM | Admit: 2019-09-02 | Discharge: 2019-09-02 | Disposition: A | Discharge status: Home / Self Care | Payer: Medicare Other | Attending: Emergency Medicine | Admitting: Emergency Medicine

## 2019-09-02 DIAGNOSIS — F17210 Nicotine dependence, cigarettes, uncomplicated: Secondary | ICD-10-CM | POA: Insufficient documentation

## 2019-09-02 DIAGNOSIS — M7532 Calcific tendinitis of left shoulder: Secondary | ICD-10-CM | POA: Diagnosis not present

## 2019-09-02 DIAGNOSIS — M79602 Pain in left arm: Secondary | ICD-10-CM | POA: Diagnosis not present

## 2019-09-02 DIAGNOSIS — M25512 Pain in left shoulder: Secondary | ICD-10-CM | POA: Diagnosis present

## 2019-09-02 DIAGNOSIS — Z79899 Other long term (current) drug therapy: Secondary | ICD-10-CM | POA: Insufficient documentation

## 2019-09-02 DIAGNOSIS — I10 Essential (primary) hypertension: Secondary | ICD-10-CM | POA: Diagnosis not present

## 2019-09-02 MED ORDER — IBUPROFEN 600 MG PO TABS: 600.0000 mg | ORAL_TABLET | Freq: Four times a day (QID) | ORAL | 0 refills | Status: DC

## 2019-09-02 MED ORDER — IBUPROFEN 800 MG PO TABS
800.0000 mg | ORAL_TABLET | Freq: Once | ORAL | Status: AC
  Administered 2019-09-02: 800 mg via ORAL
  Filled 2019-09-02: qty 1

## 2019-09-02 MED ORDER — ONDANSETRON HCL 4 MG PO TABS
4.0000 mg | ORAL_TABLET | Freq: Once | ORAL | Status: AC
  Administered 2019-09-02: 4 mg via ORAL
  Filled 2019-09-02: qty 1

## 2019-09-02 NOTE — ED Triage Notes (Signed)
Pt presents to ED with complaints of left arm pain from shoulder to wrist x 4 days. Pt denies injury.

## 2019-09-02 NOTE — Discharge Instructions (Addendum)
Your xray is negative for fracture or dislocation.  Your x-ray does show a condition called calcific tendinitis and it does show some arthritis involving your shoulder.  Please use a heating pad to your shoulder a couple times a day.  Please use ibuprofen with breakfast, lunch, dinner, and at bedtime.  May use Tylenol in between the ibuprofen doses if needed.  Please see Dr. Aline Brochure for orthopedic evaluation or see your primary physician for additional management concerning this arthritis and calcific tendinitis involving your shoulder.

## 2019-09-02 NOTE — ED Provider Notes (Addendum)
[facility]ENCY [facility]PARTMENT Provider Note   CSN: [id number] Arrival date & time: 09/02/19  1257     History   Chief Complaint Chief Complaint  Patient presents with  . Arm Pain    HPI Dagmar HaitJames W Brickel is a 63 y.o. male.     Patient is a 63 year old male who presents to the emergency department with complaint of left shoulder pain.  The patient states that a few years ago he sustained a break of the left shoulder.  He says he had some problems initially after the break but after that he has been able to do most of what he wants to do without any problem.  This changed about for 5 days ago when he began to have pain and hear a grinding type of sensation when he moves his shoulder in certain positions.  The pain moves from the shoulder down toward the elbow.  The pain kept getting progressively worse, and the patient came to the emergency department for evaluation.  No new injury reported.  He has not noticed a hot joint.  He has not had any recent operations or procedures involving the left upper extremity.  The history is provided by the patient.    Past Medical History:  Diagnosis Date  . Alcohol abuse   . Hypertension   . Use of cane as ambulatory aid   . Vertigo     Patient Active Problem List   Diagnosis Date Noted  . Colon cancer screening 12/31/2018  . Clavicle fracture 01/15/2014  . Closed fracture of acromial end of clavicle 12/22/2013    Past Surgical History:  Procedure Laterality Date  . COLONOSCOPY  2009   Dr. Jena Gaussourk: difficult prep (patient ate solid food during prep). single hyperplastic polyps removed.  Marland Kitchen. MOUTH SURGERY    . right ankle surgery     forklift injury        Home Medications    Prior to Admission medications   Medication Sig Start Date End Date Taking? Authorizing Provider  amLODipine (NORVASC) 10 MG tablet Take 10 mg by mouth daily.    [provider]  hydrochlorothiazide (HYDRODIURIL) 25 MG tablet Take 25 mg by mouth  daily.    [provider]  losartan (COZAAR) 50 MG tablet Take 1 tablet by mouth daily. 10/24/18   [provider]    Family History Family History  Problem Relation Age of Onset  . Colon cancer Neg Hx     Social History Social History   Tobacco Use  . Smoking status: Current Every Day Smoker  . Smokeless tobacco: Never Used  Substance Use Topics  . Alcohol use: Yes    Comment: previously three 24 ozs per day; 12/31/18 beer occ  . Drug use: No     Allergies   Other   Review of Systems Review of Systems  Constitutional: Negative for activity change and appetite change.  HENT: Negative for congestion, ear discharge, ear pain, facial swelling, nosebleeds, rhinorrhea, sneezing and tinnitus.   Eyes: Negative for photophobia, pain and discharge.  Respiratory: Negative for cough, choking, shortness of breath and wheezing.   Cardiovascular: Negative for chest pain, palpitations and leg swelling.  Gastrointestinal: Negative for abdominal pain, blood in stool, constipation, diarrhea, nausea and vomiting.  Genitourinary: Negative for difficulty urinating, dysuria, flank pain, frequency and hematuria.  Musculoskeletal: Positive for arthralgias. Negative for back pain, gait problem, myalgias and neck pain.  Skin: Negative for color change, rash and wound.  Neurological: Negative  for dizziness, seizures, syncope, facial asymmetry, speech difficulty, weakness and numbness.  Hematological: Negative for adenopathy. Does not bruise/bleed easily.  Psychiatric/Behavioral: Negative for agitation, confusion, hallucinations, self-injury and suicidal ideas. The patient is not nervous/anxious.      Physical Exam Updated Vital Signs BP 112/82 (BP Location: Right Arm)   Pulse 89   Temp 97.8 F (36.6 C) (Oral)   Resp 16   SpO2 100%   Physical Exam Vitals signs and nursing note reviewed.  Constitutional:      Appearance: He is well-developed. He is not toxic-appearing.   HENT:     Head: Normocephalic.     Right Ear: Tympanic membrane and external ear normal.     Left Ear: Tympanic membrane and external ear normal.  Eyes:     General: Lids are normal.     Pupils: Pupils are equal, round, and reactive to light.  Neck:     Musculoskeletal: Normal range of motion and neck supple.     Vascular: No carotid bruit.  Cardiovascular:     Rate and Rhythm: Normal rate and regular rhythm.     Pulses: Normal pulses.     Heart sounds: Normal heart sounds.  Pulmonary:     Effort: No respiratory distress.     Breath sounds: Normal breath sounds.  Abdominal:     General: Bowel sounds are normal.     Palpations: Abdomen is soft.     Tenderness: There is no abdominal tenderness. There is no guarding.  Musculoskeletal:        General: Tenderness present.     Left shoulder: He exhibits decreased range of motion and pain.  Lymphadenopathy:     Head:     Right side of head: No submandibular adenopathy.     Left side of head: No submandibular adenopathy.     Cervical: No cervical adenopathy.  Skin:    General: Skin is warm and dry.  Neurological:     Mental Status: He is alert and oriented to person, place, and time.     Cranial Nerves: No cranial nerve deficit.     Sensory: No sensory deficit.  Psychiatric:        Speech: Speech normal.      ED Treatments / Results  Labs (all labs ordered are listed, but only abnormal results are displayed) Labs Reviewed - No data to display  EKG None  Radiology Dg Shoulder Left  Result Date: 09/02/2019 CLINICAL DATA:  Left arm pain.  No injury EXAM: LEFT SHOULDER - 2+ VIEW COMPARISON:  02/10/2014 FINDINGS: Negative for fracture or dislocation. Small soft tissue calcification adjacent to the greater tuberosity may represent calcific tendinitis. This is only seen on the axillary view. IMPRESSION: Negative for fracture.  No significant degenerative change Probable calcific tendinitis. Electronically Signed   By: Franchot Gallo M.D.   On: 09/02/2019 14:01    Procedures Procedures (including critical care time)  Medications Ordered in ED Medications  ibuprofen (ADVIL) tablet 800 mg (has no administration in time range)  ondansetron (ZOFRAN) tablet 4 mg (has no administration in time range)     Initial Impression / Assessment and Plan / ED Course  I have reviewed the triage vital signs and the nursing notes.  Pertinent labs & imaging results that were available during my care of the patient were reviewed by me and considered in my medical decision making (see chart for details).          Final Clinical Impressions(s) / ED  Diagnoses MDM  Vital signs reviewed.  Pulse oximetry is 100% on room air.  Within normal limits by my interpretation.  No gross neurologic deficit appreciated.  No vascular deficit appreciated.  X-ray shows arthritis and calcific tendinitis of the left shoulder.  I have discussed this with the patient in terms of which he understands.  Patient will use ibuprofen with each meal and at bedtime.  He is to follow-up with Dr. Romeo Apple for orthopedic evaluation and management.   Final diagnoses:  Calcific tendonitis of left shoulder    ED Discharge Orders         Ordered    ibuprofen (ADVIL) 600 MG tablet  4 times daily     09/02/19 1643           Ivery Quale, PA-C 09/02/19 1658    Ivery Quale, PA-C 09/02/19 1659    Eber Hong, MD 09/03/19 [phone]

## 2019-09-04 ENCOUNTER — Telehealth: Payer: Self-pay | Admitting: Orthopedic Surgery

## 2019-09-04 NOTE — Telephone Encounter (Addendum)
Mr. Ehle called today stating that he went to the ED on the 10th because his arm was hurting and he wanted to know what was going on with it.  He said he was seen there and he was told make an appointment with Dr. Aline Brochure.  I told him that Dr. Aline Brochure was not in the office today and that it would probably be at least next week sometime before I could schedule him.  He said no, if I cant come in today, I don't want to make an appointment.  I told him that Dr. Aline Brochure was in surgery today and that unfortunately he didn't have anything for tomorrow.  He said "well, if I can't come in today, I don't to come another day!"  I told him again that Dr. Aline Brochure was not in the office because he was in surgery.  Mr. Canepa stated he will just call Dr. Josephine Cables office.

## 2019-09-26 DIAGNOSIS — I10 Essential (primary) hypertension: Secondary | ICD-10-CM | POA: Diagnosis not present

## 2019-09-26 DIAGNOSIS — Z79899 Other long term (current) drug therapy: Secondary | ICD-10-CM | POA: Diagnosis not present

## 2019-10-03 DIAGNOSIS — E871 Hypo-osmolality and hyponatremia: Secondary | ICD-10-CM | POA: Diagnosis not present

## 2019-10-03 DIAGNOSIS — I10 Essential (primary) hypertension: Secondary | ICD-10-CM | POA: Diagnosis not present

## 2020-03-03 DIAGNOSIS — Z681 Body mass index (BMI) 19 or less, adult: Secondary | ICD-10-CM | POA: Diagnosis not present

## 2020-07-07 DIAGNOSIS — Z720 Tobacco use: Secondary | ICD-10-CM | POA: Diagnosis not present

## 2020-07-07 DIAGNOSIS — I10 Essential (primary) hypertension: Secondary | ICD-10-CM | POA: Diagnosis not present

## 2020-07-28 ENCOUNTER — Other Ambulatory Visit: Payer: Self-pay | Admitting: Internal Medicine

## 2020-07-28 ENCOUNTER — Other Ambulatory Visit (HOSPITAL_COMMUNITY): Payer: Self-pay | Admitting: Internal Medicine

## 2020-07-28 DIAGNOSIS — Z122 Encounter for screening for malignant neoplasm of respiratory organs: Secondary | ICD-10-CM

## 2020-08-20 ENCOUNTER — Ambulatory Visit (HOSPITAL_COMMUNITY): Payer: Medicare Other

## 2020-08-20 ENCOUNTER — Encounter (HOSPITAL_COMMUNITY): Payer: Self-pay

## 2020-11-02 DIAGNOSIS — I10 Essential (primary) hypertension: Secondary | ICD-10-CM | POA: Diagnosis not present

## 2020-11-02 DIAGNOSIS — Z1159 Encounter for screening for other viral diseases: Secondary | ICD-10-CM | POA: Diagnosis not present

## 2020-11-02 DIAGNOSIS — Z79899 Other long term (current) drug therapy: Secondary | ICD-10-CM | POA: Diagnosis not present

## 2020-11-09 DIAGNOSIS — I10 Essential (primary) hypertension: Secondary | ICD-10-CM | POA: Diagnosis not present

## 2020-11-09 DIAGNOSIS — E871 Hypo-osmolality and hyponatremia: Secondary | ICD-10-CM | POA: Diagnosis not present

## 2021-03-11 DIAGNOSIS — I10 Essential (primary) hypertension: Secondary | ICD-10-CM | POA: Diagnosis not present

## 2021-03-11 DIAGNOSIS — F17210 Nicotine dependence, cigarettes, uncomplicated: Secondary | ICD-10-CM | POA: Diagnosis not present

## 2021-07-15 DIAGNOSIS — I10 Essential (primary) hypertension: Secondary | ICD-10-CM | POA: Diagnosis not present

## 2021-07-15 DIAGNOSIS — E871 Hypo-osmolality and hyponatremia: Secondary | ICD-10-CM | POA: Diagnosis not present

## 2021-07-15 DIAGNOSIS — Z23 Encounter for immunization: Secondary | ICD-10-CM | POA: Diagnosis not present

## 2021-09-10 ENCOUNTER — Other Ambulatory Visit: Payer: Self-pay

## 2021-09-10 ENCOUNTER — Emergency Department (HOSPITAL_COMMUNITY): Payer: Medicare Other

## 2021-09-10 ENCOUNTER — Emergency Department (HOSPITAL_COMMUNITY)
Admission: EM | Admit: 2021-09-10 | Discharge: 2021-09-11 | Disposition: A | Discharge status: Home / Self Care | Payer: Medicare Other | Attending: Emergency Medicine | Admitting: Emergency Medicine

## 2021-09-10 ENCOUNTER — Encounter (HOSPITAL_COMMUNITY): Payer: Self-pay | Admitting: Emergency Medicine

## 2021-09-10 DIAGNOSIS — S199XXA Unspecified injury of neck, initial encounter: Secondary | ICD-10-CM | POA: Diagnosis not present

## 2021-09-10 DIAGNOSIS — W19XXXA Unspecified fall, initial encounter: Secondary | ICD-10-CM | POA: Insufficient documentation

## 2021-09-10 DIAGNOSIS — Z79899 Other long term (current) drug therapy: Secondary | ICD-10-CM | POA: Insufficient documentation

## 2021-09-10 DIAGNOSIS — S82001A Unspecified fracture of right patella, initial encounter for closed fracture: Secondary | ICD-10-CM | POA: Diagnosis not present

## 2021-09-10 DIAGNOSIS — S82044A Nondisplaced comminuted fracture of right patella, initial encounter for closed fracture: Secondary | ICD-10-CM | POA: Insufficient documentation

## 2021-09-10 DIAGNOSIS — R296 Repeated falls: Secondary | ICD-10-CM | POA: Diagnosis not present

## 2021-09-10 DIAGNOSIS — S6292XA Unspecified fracture of left wrist and hand, initial encounter for closed fracture: Secondary | ICD-10-CM | POA: Insufficient documentation

## 2021-09-10 DIAGNOSIS — M25532 Pain in left wrist: Secondary | ICD-10-CM | POA: Diagnosis not present

## 2021-09-10 DIAGNOSIS — M7989 Other specified soft tissue disorders: Secondary | ICD-10-CM | POA: Diagnosis not present

## 2021-09-10 DIAGNOSIS — I10 Essential (primary) hypertension: Secondary | ICD-10-CM | POA: Insufficient documentation

## 2021-09-10 DIAGNOSIS — S01112A Laceration without foreign body of left eyelid and periocular area, initial encounter: Secondary | ICD-10-CM | POA: Diagnosis not present

## 2021-09-10 DIAGNOSIS — S6992XA Unspecified injury of left wrist, hand and finger(s), initial encounter: Secondary | ICD-10-CM | POA: Diagnosis present

## 2021-09-10 DIAGNOSIS — S62102A Fracture of unspecified carpal bone, left wrist, initial encounter for closed fracture: Secondary | ICD-10-CM

## 2021-09-10 DIAGNOSIS — F17200 Nicotine dependence, unspecified, uncomplicated: Secondary | ICD-10-CM | POA: Insufficient documentation

## 2021-09-10 DIAGNOSIS — I6523 Occlusion and stenosis of bilateral carotid arteries: Secondary | ICD-10-CM | POA: Diagnosis not present

## 2021-09-10 DIAGNOSIS — J432 Centrilobular emphysema: Secondary | ICD-10-CM | POA: Diagnosis not present

## 2021-09-10 NOTE — ED Triage Notes (Signed)
Pt c/o pain to LT wrist and RT knee. Pt states he fell yesterday and today. Pt states he just lost his balance with his cane. Pt has arm in sling.

## 2021-09-10 NOTE — ED Notes (Signed)
Patient transported to CT 

## 2021-09-10 NOTE — ED Provider Notes (Signed)
[facility] EMERGENCY DEPARTMENT Provider Note   CSN: [id number] Arrival date & time: 09/10/21  1736     History Chief Complaint  Patient presents with   Kenneth Lester is a 65 y.o. male presents to the emergency department with wife for evaluation of multiple falls.  Patient's first fall happened yesterday where his wife said he was intoxicated.  Patient reports he hurt his right knee at that time.  Today, he tripped over a rug in the living room and fell on an outstretched left arm.  The patient did not notice the facial laceration or bruise to his left orbital, but does not member when he got that.  Him and his wife both deny any blood thinner use.  Denies any headache, blurry vision, weakness.  Denies any chest pain or shortness of breath.  Denies any abdominal pain, nausea, vomiting.  Denies any back or neck pain. Patient is usually ambulatory with a cane at home.  Medical history includes hypertension.  No known drug allergies.   Fall Pertinent negatives include no chest pain, no abdominal pain, no headaches and no shortness of breath.      Past Medical History:  Diagnosis Date   Alcohol abuse    Hypertension    Use of cane as ambulatory aid    Vertigo     Patient Active Problem List   Diagnosis Date Noted   Colon cancer screening 12/31/2018   Clavicle fracture 01/15/2014   Closed fracture of acromial end of clavicle 12/22/2013    Past Surgical History:  Procedure Laterality Date   COLONOSCOPY  2009   Dr. Gala Romney: difficult prep (patient ate solid food during prep). single hyperplastic polyps removed.   MOUTH SURGERY     right ankle surgery     forklift injury       Family History  Problem Relation Age of Onset   Colon cancer Neg Hx     Social History   Tobacco Use   Smoking status: Every Day   Smokeless tobacco: Never  Substance Use Topics   Alcohol use: Yes    Comment: previously three 24 ozs per day; 12/31/18 beer occ   Drug use: No     Home Medications Prior to Admission medications   Medication Sig Start Date End Date Taking? Authorizing Provider  amLODipine (NORVASC) 10 MG tablet Take 10 mg by mouth daily.    [provider]  hydrochlorothiazide (HYDRODIURIL) 25 MG tablet Take 25 mg by mouth daily.    [provider]  ibuprofen (ADVIL) 600 MG tablet Take 1 tablet (600 mg total) by mouth 4 (four) times daily. 09/02/19   Lily Kocher, PA-C  losartan (COZAAR) 50 MG tablet Take 1 tablet by mouth daily. 10/24/18   [provider]    Allergies    Other  Review of Systems   Review of Systems  Constitutional:  Negative for chills and fever.  HENT:  Negative for ear pain and sore throat.   Eyes:  Negative for photophobia, pain and visual disturbance.  Respiratory:  Negative for cough and shortness of breath.   Cardiovascular:  Negative for chest pain and palpitations.  Gastrointestinal:  Negative for abdominal pain and vomiting.  Genitourinary:  Negative for dysuria and hematuria.  Musculoskeletal:  Positive for arthralgias, joint swelling and myalgias. Negative for back pain, neck pain and neck stiffness.  Skin:  Negative for color change and rash.  Neurological:  Negative for seizures, syncope, weakness, numbness and  headaches.  All other systems reviewed and are negative.  Physical Exam Updated Vital Signs BP (!) 135/92 (BP Location: Left Arm)   Pulse 74   Temp 98.2 F (36.8 C) (Oral)   Resp 18   Ht 5\' 8"  (1.727 m)   Wt 61.2 kg   SpO2 97%   BMI 20.53 kg/m   Physical Exam Vitals and nursing note reviewed.  Constitutional:      General: He is not in acute distress.    Appearance: Normal appearance. He is not toxic-appearing.  HENT:     Head: Normocephalic.     Comments: She has a small superficial laceration to the left eyebrow with a small amount of ecchymosis to the lateral inferior orbital of the left eye.  No obvious step-offs or deformities.    Nose: Nose normal.   Eyes:     General: No scleral icterus.    Extraocular Movements: Extraocular movements intact.     Pupils: Pupils are equal, round, and reactive to light.  Cardiovascular:     Rate and Rhythm: Normal rate and regular rhythm.  Pulmonary:     Effort: Pulmonary effort is normal. No respiratory distress.  Abdominal:     General: Abdomen is flat. Bowel sounds are normal.     Palpations: Abdomen is soft.     Tenderness: There is no abdominal tenderness. There is no guarding or rebound.  Musculoskeletal:        General: Swelling, tenderness, deformity and signs of injury present.     Cervical back: Normal range of motion and neck supple. No tenderness.     Right lower leg: No edema.     Left lower leg: No edema.     Comments: LUE -obvious deformity to the left wrist.  Patient is still able to wiggle fingers.  Cap refill intact.  Strong radial pulses.  Sensation intact.  Patient is able to move his elbow and shoulder.  Compartment soft.  RLE -mild swelling noted to the superior portion of the right knee.  No overlying skin changes noted.  Patient is tender to touch surrounding the area of the patella, but mainly superior portion.  Sensation intact.  Patient has DP and PT pulses intact.  Compartment soft.  No obvious deformity or step-off. The patient can only lift his leg so far off the bed without pain and is experiencing pain against resistance.   RLE - good strength. Sensation intact.   Patient does not endorse any midline cervical, thoracic, lumbar, or sacral spinal tenderness.  Denies any paraspinal tenderness.  Patient has full range of motion of his neck.  No step-offs or deformities noted.  No overlying skin changes noted.  Patient was able to ambulate with a cane.  Skin:    General: Skin is warm and dry.  Neurological:     General: No focal deficit present.     Mental Status: He is alert. Mental status is at baseline.     Sensory: No sensory deficit.    ED Results / Procedures /  Treatments   Labs (all labs ordered are listed, but only abnormal results are displayed) Labs Reviewed - No data to display  EKG None  Radiology DG Wrist Complete Left  Result Date: 09/10/2021 CLINICAL DATA:  [place], left wrist pain EXAM: LEFT WRIST - COMPLETE 3+ VIEW COMPARISON:  None. FINDINGS: Frontal, oblique, lateral, and ulnar deviated views of the left wrist are obtained. There is a comminuted intra-articular distal left radial fracture, with dorsal impaction  and angulation. The radiocarpal joint remains intact. There is a minimally displaced ulnar styloid fracture. No other acute bony abnormalities. Mild osteoarthritis is seen within the radial aspect of the carpus. There is a 3 mm metallic foreign body within the volar soft tissues of the wrist, of uncertain acuity. Diffuse soft tissue swelling. IMPRESSION: 1. Comminuted intra-articular distal left radial fracture with dorsal impaction and angulation. 2. Minimally displaced ulnar styloid fracture. 3. Diffuse soft tissue swelling. 4. 3 mm metallic foreign body within the volar soft tissues of the wrist, of uncertain acuity. Electronically Signed   By: Randa Ngo M.D.   On: 09/10/2021 19:30   CT Head Wo Contrast  Result Date: 09/10/2021 CLINICAL DATA:  Patient fell today and yesterday, only c/o left wrist pain, small scabbed over lac to left eyebrow. Patient denies LOC. EXAM: CT HEAD WITHOUT CONTRAST CT MAXILLOFACIAL WITHOUT CONTRAST CT CERVICAL SPINE WITHOUT CONTRAST TECHNIQUE: Multidetector CT imaging of the head, cervical spine, and maxillofacial structures were performed using the standard protocol without intravenous contrast. Multiplanar CT image reconstructions of the cervical spine and maxillofacial structures were also generated. COMPARISON:  None. FINDINGS: CT HEAD FINDINGS BRAIN: BRAIN Cerebral ventricle sizes are concordant with the degree of cerebral volume loss. Patchy and confluent areas of decreased attenuation are noted  throughout the deep and periventricular white matter of the cerebral hemispheres bilaterally, compatible with chronic microvascular ischemic disease. No evidence of large-territorial acute infarction. No parenchymal hemorrhage. No mass lesion. No extra-axial collection. No mass effect or midline shift. No hydrocephalus. Basilar cisterns are patent. Vascular: No hyperdense vessel. Atherosclerotic calcifications are present within the cavernous internal carotid arteries. Skull: No acute fracture or focal lesion. Other: None. CT MAXILLOFACIAL FINDINGS Osseous: No acute displaced fracture or mandibular dislocation. No destructive process. Patient is edentulous. Sinuses/Orbits: Left ethmoid and sphenoid mucosal thickening. Mucosal thickening of bilateral maxillary sinuses. Otherwise paranasal sinuses and mastoid air cells are clear. The orbits are unremarkable. Soft tissues: Negative. CT CERVICAL SPINE FINDINGS Alignment: Normal. Skull base and vertebrae: No acute fracture. No aggressive appearing focal osseous lesion or focal pathologic process. Soft tissues and spinal canal: No prevertebral fluid or swelling. No visible canal hematoma. Upper chest: Biapical paraseptal and centrilobular emphysematous changes. Bullous emphysematous changes of the right apex. No definite periapical pneumothorax. Other: Carotid artery calcifications within the neck. IMPRESSION: 1. No acute intracranial abnormality. 2. No acute displaced facial fracture. 3. No acute displaced fracture or traumatic listhesis of the cervical spine. 4. Emphysema (ICD10-J43.9) with bullous changes. No definite periapical pneumothorax. Electronically Signed   By: Iven Finn M.D.   On: 09/10/2021 22:58   CT Cervical Spine Wo Contrast  Result Date: 09/10/2021 CLINICAL DATA:  Patient fell today and yesterday, only c/o left wrist pain, small scabbed over lac to left eyebrow. Patient denies LOC. EXAM: CT HEAD WITHOUT CONTRAST CT MAXILLOFACIAL WITHOUT  CONTRAST CT CERVICAL SPINE WITHOUT CONTRAST TECHNIQUE: Multidetector CT imaging of the head, cervical spine, and maxillofacial structures were performed using the standard protocol without intravenous contrast. Multiplanar CT image reconstructions of the cervical spine and maxillofacial structures were also generated. COMPARISON:  None. FINDINGS: CT HEAD FINDINGS BRAIN: BRAIN Cerebral ventricle sizes are concordant with the degree of cerebral volume loss. Patchy and confluent areas of decreased attenuation are noted throughout the deep and periventricular white matter of the cerebral hemispheres bilaterally, compatible with chronic microvascular ischemic disease. No evidence of large-territorial acute infarction. No parenchymal hemorrhage. No mass lesion. No extra-axial collection. No mass effect or midline shift.  No hydrocephalus. Basilar cisterns are patent. Vascular: No hyperdense vessel. Atherosclerotic calcifications are present within the cavernous internal carotid arteries. Skull: No acute fracture or focal lesion. Other: None. CT MAXILLOFACIAL FINDINGS Osseous: No acute displaced fracture or mandibular dislocation. No destructive process. Patient is edentulous. Sinuses/Orbits: Left ethmoid and sphenoid mucosal thickening. Mucosal thickening of bilateral maxillary sinuses. Otherwise paranasal sinuses and mastoid air cells are clear. The orbits are unremarkable. Soft tissues: Negative. CT CERVICAL SPINE FINDINGS Alignment: Normal. Skull base and vertebrae: No acute fracture. No aggressive appearing focal osseous lesion or focal pathologic process. Soft tissues and spinal canal: No prevertebral fluid or swelling. No visible canal hematoma. Upper chest: Biapical paraseptal and centrilobular emphysematous changes. Bullous emphysematous changes of the right apex. No definite periapical pneumothorax. Other: Carotid artery calcifications within the neck. IMPRESSION: 1. No acute intracranial abnormality. 2. No acute  displaced facial fracture. 3. No acute displaced fracture or traumatic listhesis of the cervical spine. 4. Emphysema (ICD10-J43.9) with bullous changes. No definite periapical pneumothorax. Electronically Signed   By: Iven Finn M.D.   On: 09/10/2021 22:58   DG Knee Complete 4 Views Right  Result Date: 09/10/2021 CLINICAL DATA:  [place], pain EXAM: RIGHT KNEE - COMPLETE 4+ VIEW COMPARISON:  None. FINDINGS: Frontal, bilateral oblique, lateral views of the right knee are obtained. On the external oblique and lateral views, there is a subtle horizontal lucency through the lower pole of the patella suspicious for a nondisplaced fracture. No other acute bony abnormalities. There is mild 3 compartmental osteoarthritis greatest in the medial and patellofemoral compartments. There is prepatellar soft tissue swelling. No joint effusion. IMPRESSION: 1. Findings suspicious for a nondisplaced fracture through the lower pole of the patella. 2. Prepatellar soft tissue swelling. 3. Mild 3 compartmental osteoarthritis. Electronically Signed   By: Randa Ngo M.D.   On: 09/10/2021 19:32   CT Maxillofacial Wo Contrast  Result Date: 09/10/2021 CLINICAL DATA:  Patient fell today and yesterday, only c/o left wrist pain, small scabbed over lac to left eyebrow. Patient denies LOC. EXAM: CT HEAD WITHOUT CONTRAST CT MAXILLOFACIAL WITHOUT CONTRAST CT CERVICAL SPINE WITHOUT CONTRAST TECHNIQUE: Multidetector CT imaging of the head, cervical spine, and maxillofacial structures were performed using the standard protocol without intravenous contrast. Multiplanar CT image reconstructions of the cervical spine and maxillofacial structures were also generated. COMPARISON:  None. FINDINGS: CT HEAD FINDINGS BRAIN: BRAIN Cerebral ventricle sizes are concordant with the degree of cerebral volume loss. Patchy and confluent areas of decreased attenuation are noted throughout the deep and periventricular white matter of the cerebral  hemispheres bilaterally, compatible with chronic microvascular ischemic disease. No evidence of large-territorial acute infarction. No parenchymal hemorrhage. No mass lesion. No extra-axial collection. No mass effect or midline shift. No hydrocephalus. Basilar cisterns are patent. Vascular: No hyperdense vessel. Atherosclerotic calcifications are present within the cavernous internal carotid arteries. Skull: No acute fracture or focal lesion. Other: None. CT MAXILLOFACIAL FINDINGS Osseous: No acute displaced fracture or mandibular dislocation. No destructive process. Patient is edentulous. Sinuses/Orbits: Left ethmoid and sphenoid mucosal thickening. Mucosal thickening of bilateral maxillary sinuses. Otherwise paranasal sinuses and mastoid air cells are clear. The orbits are unremarkable. Soft tissues: Negative. CT CERVICAL SPINE FINDINGS Alignment: Normal. Skull base and vertebrae: No acute fracture. No aggressive appearing focal osseous lesion or focal pathologic process. Soft tissues and spinal canal: No prevertebral fluid or swelling. No visible canal hematoma. Upper chest: Biapical paraseptal and centrilobular emphysematous changes. Bullous emphysematous changes of the right apex. No definite periapical pneumothorax.  Other: Carotid artery calcifications within the neck. IMPRESSION: 1. No acute intracranial abnormality. 2. No acute displaced facial fracture. 3. No acute displaced fracture or traumatic listhesis of the cervical spine. 4. Emphysema (ICD10-J43.9) with bullous changes. No definite periapical pneumothorax. Electronically Signed   By: Iven Finn M.D.   On: 09/10/2021 22:58    Procedures Procedures   Medications Ordered in ED Medications - No data to display  ED Course  I have reviewed the triage vital signs and the nursing notes.  Pertinent labs & imaging results that were available during my care of the patient were reviewed by me and considered in my medical decision making (see  chart for details).  65 year old male presents the emergency department for evaluation of multiple falls with left wrist and right knee pain.  The patient was intoxicated during the first fall and had a mechanical trip during the second fall.  Given the patient's inability to recollect the events that happened during the first fall, a head CT and C-spine were ordered to rule out any head bleed.  The patient had a mechanical fall and tripped over a rug for the fall today where he landed on outstretched arm with his left wrist.  He is unable to tell me when he sustained the small left eyebrow abrasion and bruising.  Because of this a maxillofacial CT was ordered.  The patient has an obvious deformity to his left wrist.  Suspect fracture.  He is able to ambulate fingers.  Good cap refill.  Sensation intact.  Strong radial pulses. Right knee has some superior swelling with tenderness to palpation.  No overt deformity.  Will obtain images.  XR of his right knee shows findings suspicious for a nondisplaced fracture through the lower pole of the patella. Prepatellar soft tissue swelling. Mild 3 compartmental osteoarthritis. XR of his wrist shows comminuted intra-articular distal left radial fracture with dorsal impaction and angulation. Minimally displaced ulnar styloid fracture. Diffuse soft tissue swelling. 3 mm metallic foreign body within the volar soft tissues of the wrist, of uncertain acuity.  CT of his head, neck, and maxillofacial showed no acute intracranial abnormality. No acute displaced facial fracture. No acute displaced fracture or traumatic listhesis of the cervical spine.  I discussed these findings with my attending who recommended an Ace bandage for the knee and a sugar-tong splint with a sling for the wrist fracture with follow-up with hand and orthopedic surgery.  I reevaluated the patient after splint placement.  Patient still has sensation intact, is able to wiggle fingers, and has good  capillary refill.  Was able to stand patient on the cane and ambulate around room.   I went over the imaging findings with the patient and partner.  I stressed the importance of following with Ortho as his arm will likely need surgery.  I recommended that they call orthopedic tomorrow, Sunday to see if there is and after our answering service, if not to call on Monday to schedule an appointment.  I recommended the patient take 600 mg of ibuprofen every 6 hours.  Patient denies a history of GI bleeds.  I discussed with him that he not feel safe sending him on a stronger pain medication with his history of alcohol use.  Education on RICE method provided given the patient's baseline ambulatory status with a cane, I wrote him a prescription for a wide-based quad cane to help with his ambulation given his new right knee and left wrist injury.  Strict return precautions  given to patient and parent.  Patient agrees to plan.  Patient is stable and being discharged home in good condition.  I discussed this case with my attending physician who cosigned this note including patient's presenting symptoms, physical exam, and planned diagnostics and interventions. Attending physician stated agreement with plan or made changes to plan which were implemented.   Attending physician assessed patient at bedside.     MDM Rules/Calculators/A&P                          Final Clinical Impression(s) / ED Diagnoses Final diagnoses:  Closed fracture of left wrist, initial encounter  Closed nondisplaced fracture of right patella, unspecified fracture morphology, initial encounter    Rx / DC Orders ED Discharge Orders     None        Achille Rich, PA-C 09/11/21 0140    Bethann Berkshire, MD 09/12/21 1059

## 2021-09-11 NOTE — Discharge Instructions (Signed)
You were seen here today for evaluation after a fall.  It was seen that you have a possible lower pole nondisplaced fracture of the patella.  Additionally you have a comminuted intra-articular distal left radial fracture with dorsal impaction and angulation along with a minimally displaced ulnar styloid fracture.  Again, it is extremely important for you to follow-up with orthopedic as this arm will need surgery.  Please call their office today to schedule an appointment.  Because of the alcohol use, it is not safe for me to prescribe you narcotic pain medication.  You can take 600 mg of ibuprofen every 6 hours as needed for pain.  Additional information on the RICE method included in this discharge paperwork.    You have been prescribed a quad base adjustable cane as discussed.  This is a paper prescription that you will need to bring into your local pharmacy or medical supply store to fill.  If you have any worsening pain, numbness to the area, weakness, please return to the nearest emergency department for reevaluation.

## 2021-09-13 DIAGNOSIS — M25561 Pain in right knee: Secondary | ICD-10-CM | POA: Diagnosis not present

## 2021-09-13 DIAGNOSIS — S82001A Unspecified fracture of right patella, initial encounter for closed fracture: Secondary | ICD-10-CM | POA: Diagnosis not present

## 2021-09-14 DIAGNOSIS — S52502A Unspecified fracture of the lower end of left radius, initial encounter for closed fracture: Secondary | ICD-10-CM | POA: Diagnosis not present

## 2021-09-19 ENCOUNTER — Other Ambulatory Visit: Payer: Self-pay

## 2021-09-19 ENCOUNTER — Encounter (HOSPITAL_BASED_OUTPATIENT_CLINIC_OR_DEPARTMENT_OTHER): Payer: Self-pay | Admitting: Orthopedic Surgery

## 2021-09-19 NOTE — Progress Notes (Addendum)
Spoke w/ via phone for pre-op interview---pt Lab needs dos---- CMP, EKG per anesthesia. Surgeon orders pending. Requested orders from Dr. Yehuda Budd via Epic IB on 09/19/21.           Lab results------none COVID test -----patient states asymptomatic no test needed Arrive at -------1120 on 09/22/21 NPO after MN NO Solid Food.  Clear liquids from MN until---1020 Med rec completed Medications to take morning of surgery -----amlodipine Diabetic medication -----n/a Patient instructed no nail polish to be worn day of surgery Patient instructed to bring photo id and insurance card day of surgery Patient aware to have Driver (ride ) / caregiver    for 24 hours after surgery - wife Malachi Bonds Patient Special Instructions -----none Pre-Op special Istructions -----Pt is a poor historian. When asked about his health history, he would often say that he couldn't remember. Patient verbalized understanding of instructions that were given at this phone interview. Patient denies shortness of breath, chest pain, fever, cough at this phone interview.

## 2021-09-19 NOTE — H&P (Signed)
Preoperative History & Physical Exam  Surgeon: Philipp Ovens, MD  Diagnosis: Left wrist fracture  Planned Procedure: Procedure(s) (LRB): OPEN REDUCTION INTERNAL FIXATION (ORIF) WRIST FRACTURE (Left)  History of Present Illness:   Patient is a 65 y.o. male with symptoms consistent with  Left wrist fracture who presents for surgical intervention. The risks, benefits and alternatives of surgical intervention were discussed and informed consent was obtained prior to surgery.  Past Medical History:  Past Medical History:  Diagnosis Date   Alcohol abuse    Hypertension    Use of cane as ambulatory aid    Vertigo     Past Surgical History:  Past Surgical History:  Procedure Laterality Date   COLONOSCOPY  2009   Dr. Jena Gauss: difficult prep (patient ate solid food during prep). single hyperplastic polyps removed.   MOUTH SURGERY     teeth pulled for dentures, pt unsure of date   right ankle surgery  2006   forklift injury    Medications:  Prior to Admission medications   Medication Sig Start Date End Date Taking? Authorizing Provider  amLODipine (NORVASC) 10 MG tablet Take 10 mg by mouth daily.    [provider]  hydrochlorothiazide (HYDRODIURIL) 25 MG tablet Take 25 mg by mouth daily.    [provider]  ibuprofen (ADVIL) 600 MG tablet Take 1 tablet (600 mg total) by mouth 4 (four) times daily. Patient taking differently: Take 600 mg by mouth every 6 (six) hours as needed. 09/02/19   Ivery Quale, PA-C  losartan (COZAAR) 50 MG tablet Take 1 tablet by mouth daily. 10/24/18   [provider]    Allergies:  Other  Review of Systems: Negative except per HPI.  Physical Exam: Alert and oriented, NAD Head and neck: no masses, normal alignment CV: pulse intact Pulm: no increased work of breathing, respirations even and unlabored Abdomen: non-distended Extremities: extremities warm and well perfused  LABS: No results found for this or any previous  visit (from the past 2160 hour(s)).   Complete History and Physical exam available in the office notes  Gomez Cleverly

## 2021-09-20 NOTE — Progress Notes (Signed)
Patient called and wanted me to review what medications he could take the morning of surgery. I told him to only take his amlodipine the morning of 09/22/21.

## 2021-09-21 NOTE — Progress Notes (Signed)
Patient called and wanted me to review with him what he could eat the morning of surgery. I re-emphasized that he could have no solid food at all after MN, only clear liquids until 1020. Patient was concerned about being hungry before surgery. I let him know that if he ate any solid food, his surgery would be cancelled.

## 2021-09-22 ENCOUNTER — Ambulatory Visit (HOSPITAL_BASED_OUTPATIENT_CLINIC_OR_DEPARTMENT_OTHER): Payer: Medicare Other | Admitting: Anesthesiology

## 2021-09-22 ENCOUNTER — Ambulatory Visit (HOSPITAL_BASED_OUTPATIENT_CLINIC_OR_DEPARTMENT_OTHER)
Admission: RE | Admit: 2021-09-22 | Discharge: 2021-09-22 | Disposition: A | Discharge status: Home / Self Care | Payer: Medicare Other | Source: Ambulatory Visit | Attending: Orthopedic Surgery | Admitting: Orthopedic Surgery

## 2021-09-22 ENCOUNTER — Encounter (HOSPITAL_BASED_OUTPATIENT_CLINIC_OR_DEPARTMENT_OTHER)
Admission: RE | Disposition: A | Discharge status: Home / Self Care | Payer: Self-pay | Source: Ambulatory Visit | Attending: Orthopedic Surgery

## 2021-09-22 ENCOUNTER — Encounter (HOSPITAL_BASED_OUTPATIENT_CLINIC_OR_DEPARTMENT_OTHER): Payer: Self-pay | Admitting: Orthopedic Surgery

## 2021-09-22 DIAGNOSIS — I10 Essential (primary) hypertension: Secondary | ICD-10-CM | POA: Insufficient documentation

## 2021-09-22 DIAGNOSIS — S52502A Unspecified fracture of the lower end of left radius, initial encounter for closed fracture: Secondary | ICD-10-CM | POA: Insufficient documentation

## 2021-09-22 DIAGNOSIS — S62102A Fracture of unspecified carpal bone, left wrist, initial encounter for closed fracture: Secondary | ICD-10-CM | POA: Diagnosis not present

## 2021-09-22 DIAGNOSIS — F17210 Nicotine dependence, cigarettes, uncomplicated: Secondary | ICD-10-CM | POA: Diagnosis not present

## 2021-09-22 DIAGNOSIS — S42033A Displaced fracture of lateral end of unspecified clavicle, initial encounter for closed fracture: Secondary | ICD-10-CM | POA: Diagnosis not present

## 2021-09-22 DIAGNOSIS — S52572A Other intraarticular fracture of lower end of left radius, initial encounter for closed fracture: Secondary | ICD-10-CM | POA: Diagnosis not present

## 2021-09-22 DIAGNOSIS — X58XXXA Exposure to other specified factors, initial encounter: Secondary | ICD-10-CM | POA: Insufficient documentation

## 2021-09-22 DIAGNOSIS — F1010 Alcohol abuse, uncomplicated: Secondary | ICD-10-CM

## 2021-09-22 HISTORY — PX: ORIF WRIST FRACTURE: SHX2133

## 2021-09-22 LAB — POCT I-STAT, CHEM 8
BUN: 4 mg/dL — ABNORMAL LOW (ref 8–23)
Calcium, Ion: 1.18 mmol/L (ref 1.15–1.40)
Chloride: 101 mmol/L (ref 98–111)
Creatinine, Ser: 0.6 mg/dL — ABNORMAL LOW (ref 0.61–1.24)
Glucose, Bld: 84 mg/dL (ref 70–99)
HCT: 46 % (ref 39.0–52.0)
Hemoglobin: 15.6 g/dL (ref 13.0–17.0)
Potassium: 3.6 mmol/L (ref 3.5–5.1)
Sodium: 138 mmol/L (ref 135–145)
TCO2: 26 mmol/L (ref 22–32)

## 2021-09-22 SURGERY — OPEN REDUCTION INTERNAL FIXATION (ORIF) WRIST FRACTURE
Anesthesia: Monitor Anesthesia Care | Site: Wrist | Laterality: Left

## 2021-09-22 MED ORDER — ONDANSETRON HCL 4 MG/2ML IJ SOLN
INTRAMUSCULAR | Status: DC | PRN
  Administered 2021-09-22: 4 mg via INTRAVENOUS

## 2021-09-22 MED ORDER — ROPIVACAINE HCL 5 MG/ML IJ SOLN
INTRAMUSCULAR | Status: DC | PRN
  Administered 2021-09-22: 20 mL via PERINEURAL

## 2021-09-22 MED ORDER — CEFAZOLIN SODIUM-DEXTROSE 2-4 GM/100ML-% IV SOLN
2.0000 g | INTRAVENOUS | Status: AC
  Administered 2021-09-22: 2 g via INTRAVENOUS

## 2021-09-22 MED ORDER — PROPOFOL 500 MG/50ML IV EMUL
INTRAVENOUS | Status: DC | PRN
  Administered 2021-09-22: 75 ug/kg/min via INTRAVENOUS

## 2021-09-22 MED ORDER — PROPOFOL 10 MG/ML IV BOLUS
INTRAVENOUS | Status: DC | PRN
  Administered 2021-09-22: 20 mg via INTRAVENOUS

## 2021-09-22 MED ORDER — FENTANYL CITRATE (PF) 100 MCG/2ML IJ SOLN
INTRAMUSCULAR | Status: AC
  Filled 2021-09-22: qty 2

## 2021-09-22 MED ORDER — LACTATED RINGERS IV SOLN: INTRAVENOUS | Status: DC

## 2021-09-22 MED ORDER — PHENYLEPHRINE HCL (PRESSORS) 10 MG/ML IV SOLN
INTRAVENOUS | Status: DC | PRN
  Administered 2021-09-22 (×2): 80 ug via INTRAVENOUS

## 2021-09-22 MED ORDER — DEXAMETHASONE SODIUM PHOSPHATE 10 MG/ML IJ SOLN
INTRAMUSCULAR | Status: DC | PRN
  Administered 2021-09-22: 10 mg

## 2021-09-22 MED ORDER — OXYCODONE-ACETAMINOPHEN 5-325 MG PO TABS: 1.0000 | ORAL_TABLET | Freq: Four times a day (QID) | ORAL | 0 refills | Status: AC | PRN

## 2021-09-22 MED ORDER — PHENYLEPHRINE 40 MCG/ML (10ML) SYRINGE FOR IV PUSH (FOR BLOOD PRESSURE SUPPORT)
PREFILLED_SYRINGE | INTRAVENOUS | Status: AC
  Filled 2021-09-22: qty 10

## 2021-09-22 MED ORDER — ONDANSETRON HCL 4 MG/2ML IJ SOLN: 4.0000 mg | Freq: Once | INTRAMUSCULAR | Status: DC | PRN

## 2021-09-22 MED ORDER — CEFAZOLIN SODIUM-DEXTROSE 2-4 GM/100ML-% IV SOLN
INTRAVENOUS | Status: AC
  Filled 2021-09-22: qty 100

## 2021-09-22 MED ORDER — FENTANYL CITRATE (PF) 100 MCG/2ML IJ SOLN: 25.0000 ug | INTRAMUSCULAR | Status: DC | PRN

## 2021-09-22 MED ORDER — FENTANYL CITRATE (PF) 100 MCG/2ML IJ SOLN
50.0000 ug | Freq: Once | INTRAMUSCULAR | Status: AC
  Administered 2021-09-22: 50 ug via INTRAVENOUS

## 2021-09-22 MED ORDER — MIDAZOLAM HCL 2 MG/2ML IJ SOLN
INTRAMUSCULAR | Status: AC
  Filled 2021-09-22: qty 2

## 2021-09-22 MED ORDER — ONDANSETRON HCL 4 MG/2ML IJ SOLN
INTRAMUSCULAR | Status: AC
  Filled 2021-09-22: qty 2

## 2021-09-22 MED ORDER — LIDOCAINE 2% (20 MG/ML) 5 ML SYRINGE
INTRAMUSCULAR | Status: AC
  Filled 2021-09-22: qty 5

## 2021-09-22 MED ORDER — OXYCODONE HCL 5 MG PO TABS: 5.0000 mg | ORAL_TABLET | Freq: Once | ORAL | Status: DC | PRN

## 2021-09-22 MED ORDER — PROPOFOL 1000 MG/100ML IV EMUL
INTRAVENOUS | Status: AC
  Filled 2021-09-22: qty 100

## 2021-09-22 MED ORDER — LIDOCAINE HCL (CARDIAC) PF 100 MG/5ML IV SOSY
PREFILLED_SYRINGE | INTRAVENOUS | Status: DC | PRN
  Administered 2021-09-22: 50 mg via INTRAVENOUS

## 2021-09-22 MED ORDER — OXYCODONE HCL 5 MG/5ML PO SOLN: 5.0000 mg | Freq: Once | ORAL | Status: DC | PRN

## 2021-09-22 SURGICAL SUPPLY — 65 items
BIT DRILL 2.2 SS TIBIAL (BIT) ×1 IMPLANT
BLADE SURG 15 STRL LF DISP TIS (BLADE) ×2 IMPLANT
BLADE SURG 15 STRL SS (BLADE) ×4
BNDG CMPR 9X4 STRL LF SNTH (GAUZE/BANDAGES/DRESSINGS) ×1
BNDG ELASTIC 4X5.8 VLCR STR LF (GAUZE/BANDAGES/DRESSINGS) ×2 IMPLANT
BNDG ESMARK 4X9 LF (GAUZE/BANDAGES/DRESSINGS) ×2 IMPLANT
BNDG PLASTER X FAST 3X3 WHT LF (CAST SUPPLIES) ×1 IMPLANT
BNDG PLSTR 9X3 FST ST WHT (CAST SUPPLIES) ×1
CORD BIPOLAR FORCEPS 12FT (ELECTRODE) ×2 IMPLANT
COVER BACK TABLE 60X90IN (DRAPES) ×2 IMPLANT
CUFF TOURN SGL QUICK 18X4 (TOURNIQUET CUFF) ×1 IMPLANT
CUFF TOURN SGL QUICK 24 (TOURNIQUET CUFF)
CUFF TRNQT CYL 24X4X16.5-23 (TOURNIQUET CUFF) IMPLANT
DECANTER SPIKE VIAL GLASS SM (MISCELLANEOUS) IMPLANT
DRAPE EXTREMITY T 121X128X90 (DISPOSABLE) ×2 IMPLANT
DRAPE OEC MINIVIEW 54X84 (DRAPES) ×2 IMPLANT
DRAPE SHEET LG 3/4 BI-LAMINATE (DRAPES) ×2 IMPLANT
DRAPE SURG 17X23 STRL (DRAPES) ×1 IMPLANT
GAUZE 4X4 16PLY ~~LOC~~+RFID DBL (SPONGE) ×2 IMPLANT
GAUZE SPONGE 4X4 12PLY STRL (GAUZE/BANDAGES/DRESSINGS) ×2 IMPLANT
GAUZE SPONGE 4X4 12PLY STRL LF (GAUZE/BANDAGES/DRESSINGS) ×1 IMPLANT
GAUZE XEROFORM 1X8 LF (GAUZE/BANDAGES/DRESSINGS) ×2 IMPLANT
GLOVE SURG ENC MOIS LTX SZ7.5 (GLOVE) ×2 IMPLANT
GLOVE SURG UNDER POLY LF SZ7.5 (GLOVE) ×2 IMPLANT
GOWN STRL REUS W/TWL LRG LVL3 (GOWN DISPOSABLE) ×4 IMPLANT
HIBICLENS CHG 4% 4OZ BTL (MISCELLANEOUS) ×2 IMPLANT
K-WIRE 1.6 (WIRE) ×4
K-WIRE DBL END TROCAR 6X.045 (WIRE)
K-WIRE DBL END TROCAR 6X.062 (WIRE)
K-WIRE FX5X1.6XNS BN SS (WIRE) ×2
KIT TURNOVER CYSTO (KITS) ×2 IMPLANT
KWIRE DBL END TROCAR 6X.045 (WIRE) IMPLANT
KWIRE DBL END TROCAR 6X.062 (WIRE) IMPLANT
KWIRE FX5X1.6XNS BN SS (WIRE) IMPLANT
NDL HYPO 25X1 1.5 SAFETY (NEEDLE) IMPLANT
NEEDLE HYPO 25X1 1.5 SAFETY (NEEDLE) IMPLANT
NS IRRIG 1000ML POUR BTL (IV SOLUTION) ×2 IMPLANT
PACK BASIN DAY SURGERY FS (CUSTOM PROCEDURE TRAY) ×2 IMPLANT
PAD CAST 4YDX4 CTTN HI CHSV (CAST SUPPLIES) ×1 IMPLANT
PADDING CAST ABS 4INX4YD NS (CAST SUPPLIES)
PADDING CAST ABS COTTON 4X4 ST (CAST SUPPLIES) ×1 IMPLANT
PADDING CAST COTTON 4X4 STRL (CAST SUPPLIES) ×6
PEG LOCKING SMOOTH 2.2X18 (Peg) ×4 IMPLANT
PEG LOCKING SMOOTH 2.2X20 (Screw) ×4 IMPLANT
PLATE STD DVR LEFT (Plate) ×2 IMPLANT
PLATE STD DVR LT 24X55 (Plate) IMPLANT
PLATE WIDE DVR LEFT (Plate) ×1 IMPLANT
SCREW  LP NL 2.7X16MM (Screw) ×2 IMPLANT
SCREW LOCK 16X2.7X 3 LD TPR (Screw) IMPLANT
SCREW LOCKING 2.7X16 (Screw) ×8 IMPLANT
SCREW LP NL 2.7X16MM (Screw) IMPLANT
SCREW NONLOCK 2.7X18MM (Screw) ×1 IMPLANT
SLEEVE SCD COMPRESS KNEE MED (STOCKING) IMPLANT
SLING ARM FOAM STRAP LRG (SOFTGOODS) ×1 IMPLANT
SPONGE T-LAP 4X18 ~~LOC~~+RFID (SPONGE) ×2 IMPLANT
STRIP CLOSURE SKIN 1/2X4 (GAUZE/BANDAGES/DRESSINGS) ×1 IMPLANT
SUCTION FRAZIER HANDLE 10FR (MISCELLANEOUS)
SUCTION TUBE FRAZIER 10FR DISP (MISCELLANEOUS) IMPLANT
SUT ETHILON 4 0 PS 2 18 (SUTURE) ×2 IMPLANT
SYR BULB EAR ULCER 3OZ GRN STR (SYRINGE) ×2 IMPLANT
SYR CONTROL 10ML LL (SYRINGE) IMPLANT
TOWEL OR 17X26 10 PK STRL BLUE (TOWEL DISPOSABLE) ×2 IMPLANT
TRAY DSU PREP LF (CUSTOM PROCEDURE TRAY) ×2 IMPLANT
TUBE CONNECTING 12X1/4 (SUCTIONS) IMPLANT
UNDERPAD 30X36 HEAVY ABSORB (UNDERPADS AND DIAPERS) ×2 IMPLANT

## 2021-09-22 NOTE — Op Note (Signed)
OPERATIVE NOTE  DATE OF PROCEDURE: 09/22/2021  SURGEONS: Primary: [name], MD  PREOPERATIVE DIAGNOSIS: Left wrist fracture  POSTOPERATIVE DIAGNOSIS: Same  NAME OF PROCEDURE:    LEFT distal radius open reduction internal fixation, 3 fragments 2.    LEFT wrist brachioradialis tenotomy  3.    LEFT wrist radiographs four views with intraoperative interpretation   ANESTHESIA: Regional Block + MAC  SKIN PREPARATION: Hibiclens  ESTIMATED BLOOD LOSS: Minimal  IMPLANTS: [employer]omet DVR Crosslock volar plate and screws  Implant Name Type Inv. Item Serial No. Manufacturer Lot No. LRB No. Used Action  PLATE WIDE DVR LEFT - [id number] Plate PLATE WIDE DVR LEFT  [employer](ORTH,TRAU,BIO,SG)  Left 1 Implanted  SCREW  LP NL 2.7X16MM - [id number] Screw SCREW  LP NL 2.7X16MM  [employer](ORTH,TRAU,BIO,SG)  Left 1 Implanted  SCREW NONLOCK 2.7X18MM - [id number] Screw SCREW NONLOCK 2.7X18MM  [employer](ORTH,TRAU,BIO,SG)  Left 1 Implanted  PEG LOCKING SMOOTH 2.2X18 - [id number] Peg PEG LOCKING SMOOTH 2.2X18  [employer](ORTH,TRAU,BIO,SG)  Left 4 Implanted  PEG LOCKING SMOOTH 2.2X20 - [id number] Screw PEG LOCKING SMOOTH 2.2X20  [employer](ORTH,TRAU,BIO,SG)  Left 4 Implanted  SCREW LOCKING 2.7X16 - [id number] Screw SCREW LOCKING 2.7X16  [employer](ORTH,TRAU,BIO,SG)  Left 4 Implanted    INDICATIONS:  Deshawn is a 65 y.o. male who has the above preoperative diagnosis. The patient has decided to proceed with surgical intervention.  Risks, benefits and alternatives of operative management were discussed including, but not limited to, risks of anesthesia complications, infection, pain, persistent symptoms, stiffness, need for future surgery.  The patient understands, agrees and elects to proceed with surgery.    DESCRIPTION OF PROCEDURE: The patient was met in the pre-operative area and their identity was verified.  The operative location and laterality was also verified and marked.  The patient was brought  to the OR and was placed supine on the table.  After repeat patient identification with the operative team anesthesia was provided and the patient was prepped and draped in the usual sterile fashion.  A final timeout was performed verifying the correction patient, procedure, location and laterality.  Preoperative antibiotics were administered. The LEFT upper extremity was exsanguinated with an Esmarch and tourniquet inflated to 230mHg. Under loupe magnification, an incision was made directly over the flexor carpi radialis (FCR) tendon. Bipolar was utilized for hemostasis. The roof of the FCR tendon sheath was incised. The FCR tendon was then retracted ulnarly to protect the palmar cutaneous branch of the median nerve. Subsequently, the floor of the FCR tendon sheath was incised over the distal end of the radius.  The flexor pollicis longus (FPL) was swept ulnarly to reveal the pronator quadratus.  The pronator quadratus fascia was incised from its distal and radial borders. A periosteal elevator was utilized to mobilize the pronator quadratus muscle off the distal radius.  The fracture site was irrigated and prepared for reduction with a freer and adson forceps. There were 3 distal radius fracture fragments. The brachioradialis tendon insertion was released to facilitate reduction.  This release was performed by identifying the broad insertion of the brachiradialis tendon and also identifying the 1st dorsal compartment tendons.  The 1st dorsal compartment tendons were protected, the broad tendon insertion was released under direct visualization. The fracture was reduced and provisionally fixed with K-wires. We then selected a proper length and width volar plate.  The plate was placed on the distal end of the radius with the fracture reduced and secured to the bone. Using mini C-arm the  fracture reduction and position of the plate were deemed to be satisfactory.  We proceeded with securing the plate to the radius  with the 1 bicortical nonlocking screw in the oblong portion of the shaft.  With the intermediate column reduced, we secured the distal end of the plate with 2 screws in the distal ulnar portion of plate.  We again used the C-arm to verify satisfactory plate position as well as fracture reduction. The radial column was then reduced and radial styloid locking screws were placed. The remainder shaft screws were placed through the plate. We used the mini C-arm to verify satisfactory plate position, screw lengths and fracture reduction. The DRUJ was then tested in neutral, pronation and supination and was found to be stable.The tourniquet was deflated. Meticulous hemostasis was obtained. The incision was copiously irrigated with normal saline and closed with interrupted 4-0 nylon horizontal mattress sutures. The incision was covered with xeroform, sterile guaze, webril and well padded short arm splint. The fingers were pink, warm and well perfused. All counts were correct. The patient was awoken from anesthesia and brought to PACU for recovery in stable condition.   Matt Holmes, MD Orthopaedic Hand Surgery

## 2021-09-22 NOTE — Interval H&P Note (Signed)
History and Physical Interval Note:  09/22/2021 11:52 AM  Kenneth Lester  has presented today for surgery, with the diagnosis of Left wrist fracture.  The various methods of treatment have been discussed with the patient and family. After consideration of risks, benefits and other options for treatment, the patient has consented to  Procedure(s) with comments: OPEN REDUCTION INTERNAL FIXATION (ORIF) WRIST FRACTURE (Left) - with MAC as a surgical intervention.  The patient's history has been reviewed, patient examined, no change in status, stable for surgery.  I have reviewed the patient's chart and labs.  Questions were answered to the patient's satisfaction.     [name]

## 2021-09-22 NOTE — Anesthesia Postprocedure Evaluation (Signed)
Anesthesia Post Note  Patient: IVAN LACHER  Procedure(s) Performed: OPEN REDUCTION INTERNAL FIXATION (ORIF) WRIST FRACTURE (Left: Wrist)     Patient location during evaluation: PACU Anesthesia Type: Regional and MAC Level of consciousness: awake and alert Pain management: pain level controlled Vital Signs Assessment: post-procedure vital signs reviewed and stable Respiratory status: spontaneous breathing, nonlabored ventilation and respiratory function stable Cardiovascular status: blood pressure returned to baseline and stable Postop Assessment: no apparent nausea or vomiting Anesthetic complications: no   No notable events documented.  Last Vitals:  Vitals:   09/22/21 1344 09/22/21 1345  BP: 132/71 122/69  Pulse: (!) 57 62  Resp: 13 13  Temp:    SpO2: 100% 100%    Last Pain:  Vitals:   09/22/21 1344  TempSrc:   PainSc: 0-No pain                 Pervis Hocking

## 2021-09-22 NOTE — Transfer of Care (Signed)
Immediate Anesthesia Transfer of Care Note  Patient: Kenneth Lester  Procedure(s) Performed: OPEN REDUCTION INTERNAL FIXATION (ORIF) WRIST FRACTURE (Left: Wrist)  Patient Location: PACU  Anesthesia Type:MAC and Regional  Level of Consciousness: awake, alert  and oriented  Airway & Oxygen Therapy: Patient Spontanous Breathing and Patient connected to nasal cannula oxygen  Post-op Assessment: Report given to RN and Post -op Vital signs reviewed and stable  Post vital signs: Reviewed and stable  Last Vitals:  Vitals Value Taken Time  BP    Temp    Pulse    Resp    SpO2      Last Pain:  Vitals:   09/22/21 1155  TempSrc:   PainSc: 0-No pain      Patients Stated Pain Goal: 4 (63/89/37 3428)  Complications: No notable events documented.

## 2021-09-22 NOTE — Discharge Instructions (Addendum)
Orthopaedic Hand Surgery Discharge Instructions  WEIGHT BEARING STATUS: Non weight bearing on operative extremity  DRESSINGS: Please keep your dressing/splint/cast clean and dry until your follow-up appointment. You may shower by placing a waterproof covering over your dressing/splint/cast. Contact your surgeon if your splint/cast gets wet. It will need to be changed to prevent skin breakdown.  PAIN CONTROL: First line medications for post operative pain control are Tylenol (acetaminophen) and Motrin (ibuprofen) if you are able to take these medications. If you have been prescribed a medication these can be taken as breakthrough pain medications. Please note that some narcotic pain medication have acetaminophen added and you should never consume more than 4,000mg  of acetaminophen in 24 hour period. Also please note that if you are given Toradol (ketoralac) you should not take similar medications simultaneously such as ibuprofen.   ICE/ELEVATION: Ice and elevate your injured extremity as needed. Avoid direct contact of ice with skin.  HOME MEDICATIONS: No changes have been made to your home medications.  FOLLOW UP: You will be called after surgery with an appointment date and time, however if you have not received a phone call within 3 days please call during regular office hours at [phone] to schedule a post operative appointment.  Please Seek Medical Attention if: Call MD for: pain or pressure in chest, jaw, arm, back, neck  Call MD for: temperature greater than 101 F for more than 24 hours  Call MD for: difficulty breathing Call MD for: Incision redness, bleeding, drainage  Call MD for: palpitations or feeling that the heart is racing  Call MD for: increased swelling in arm, leg, ankle, or abdomen  Call MD for: lightheadedness, dizziness, fainting Go to ED or call 911 if: chest pain does not go away after 3 nitroglycerin doses taken 5 min apart  Go to ED or call 911 for: any  uncontrolled bleeding  Go to ED or call 911 if: unable to reach physician  Discharge Medications: Allergies as of 09/22/2021       Reactions   Other Other (See Comments)   Patient states he was allergic to something he was given in an IV at the hospital when being treated for pneumonia, but he doesn't remember what the name of the medication was. 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        Medication List     TAKE these medications    amLODipine 10 MG tablet Commonly known as: NORVASC Take 10 mg by mouth daily.   hydrochlorothiazide 25 MG tablet Commonly known as: HYDRODIURIL Take 25 mg by mouth daily.   ibuprofen 600 MG tablet Commonly known as: ADVIL Take 1 tablet (600 mg total) by mouth 4 (four) times daily. What changed:  when to take this reasons to take this   losartan 50 MG tablet Commonly known as: COZAAR Take 1 tablet by mouth daily.   oxyCODONE-acetaminophen 5-325 MG tablet Commonly known as: Percocet Take 1 tablet by mouth every 6 (six) hours as needed for up to 5 days for severe pain.          Mathis Dad, MD Orthopaedic Hand Surgeon [facility] Office number: [phone] [address], [address] [place], [place] [address]   Post Anesthesia Home Care Instructions  Activity: Get plenty of rest for the remainder of the day. A responsible individual must stay with you for 24 hours following the procedure.  For the next 24 hours, DO NOT: -Drive a car -Advertising copywriter -Drink alcoholic beverages -Take  any medication unless instructed by your physician -Make any legal decisions or sign important papers.  Meals: Start with liquid foods such as gelatin or soup. Progress to regular foods as tolerated. Avoid greasy, spicy, heavy foods. If nausea and/or vomiting occur, drink only clear liquids until the nausea and/or vomiting subsides. Call your physician if vomiting continues.  Special Instructions/Symptoms: Your throat may feel dry  or sore from the anesthesia or the breathing tube placed in your throat during surgery. If this causes discomfort, gargle with warm salt water. The discomfort should disappear within 24 hours.   Regional Anesthesia Blocks  1. Numbness or the inability to move the "blocked" extremity may last from 3-48 hours after placement. The length of time depends on the medication injected and your individual response to the medication. If the numbness is not going away after 48 hours, call your surgeon.  2. The extremity that is blocked will need to be protected until the numbness is gone and the  Strength has returned. Because you cannot feel it, you will need to take extra care to avoid injury. Because it may be weak, you may have difficulty moving it or using it. You may not know what position it is in without looking at it while the block is in effect.  3. For blocks in the legs and feet, returning to weight bearing and walking needs to be done carefully. You will need to wait until the numbness is entirely gone and the strength has returned. You should be able to move your leg and foot normally before you try and bear weight or walk. You will need someone to be with you when you first try to ensure you do not fall and possibly risk injury.  4. Bruising and tenderness at the needle site are common side effects and will resolve in a few days.  5. Persistent numbness or new problems with movement should be communicated to the surgeon.

## 2021-09-22 NOTE — Anesthesia Preprocedure Evaluation (Addendum)
Anesthesia Evaluation  Patient identified by MRN, date of birth, ID band Patient awake    Reviewed: Allergy & Precautions, NPO status , Patient's Chart, lab work & pertinent test results  Airway Mallampati: I  TM Distance: >3 FB Neck ROM: Full    Dental  (+) Edentulous Upper, Edentulous Lower   Pulmonary Current Smoker and Patient abstained from smoking.,  Current every day smoker, 50 pack year history  Still smoking 1ppd No inhalers   Pulmonary exam normal breath sounds clear to auscultation       Cardiovascular hypertension, Pt. on medications Normal cardiovascular exam Rhythm:Regular Rate:Normal     Neuro/Psych Memory issues Tangential thought processnegative neurological ROS     GI/Hepatic negative GI ROS, (+)     substance abuse (EtOH abuse- denies)  alcohol use,   Endo/Other  negative endocrine ROS  Renal/GU negative Renal ROS  negative genitourinary   Musculoskeletal L wrist fx   Abdominal   Peds  Hematology negative hematology ROS (+)   Anesthesia Other Findings Walks w/ cane Wife at bedside  Reproductive/Obstetrics negative OB ROS                            Anesthesia Physical Anesthesia Plan  ASA: 3  Anesthesia Plan: MAC and Regional   Post-op Pain Management: Regional block   Induction:   PONV Risk Score and Plan: 2 and Propofol infusion and TIVA  Airway Management Planned: Natural Airway and Simple Face Mask  Additional Equipment: None  Intra-op Plan:   Post-operative Plan:   Informed Consent: I have reviewed the patients History and Physical, chart, labs and discussed the procedure including the risks, benefits and alternatives for the proposed anesthesia with the patient or authorized representative who has indicated his/her understanding and acceptance.     Dental advisory given  Plan Discussed with: CRNA  Anesthesia Plan Comments:         Anesthesia Quick Evaluation

## 2021-09-22 NOTE — Progress Notes (Signed)
Assisted Dr. Finucane with left, ultrasound guided, supraclavicular block. Side rails up, monitors on throughout procedure. See vital signs in flow sheet. Tolerated Procedure well. 

## 2021-09-22 NOTE — Anesthesia Procedure Notes (Signed)
Anesthesia Regional Block: Supraclavicular block   Pre-Anesthetic Checklist: , timeout performed,  Correct Patient, Correct Site, Correct Laterality,  Correct Procedure, Correct Position, site marked,  Risks and benefits discussed,  Surgical consent,  Pre-op evaluation,  At surgeon's request and post-op pain management  Laterality: Left  Prep: Maximum Sterile Barrier Precautions used, chloraprep       Needles:  Injection technique: Single-shot  Needle Type: Echogenic Stimulator Needle     Needle Length: 9cm  Needle Gauge: 22     Additional Needles:   Procedures:,,,, ultrasound used (permanent image in chart),,    Narrative:  Start time: 09/22/2021 11:35 AM End time: 09/22/2021 11:40 AM Injection made incrementally with aspirations every 5 mL.  Performed by: Personally  Anesthesiologist: Lannie Fields, DO  Additional Notes: Monitors applied. No increased pain on injection. No increased resistance to injection. Injection made in 5cc increments. Good needle visualization. Patient tolerated procedure well.

## 2021-09-23 ENCOUNTER — Encounter (HOSPITAL_BASED_OUTPATIENT_CLINIC_OR_DEPARTMENT_OTHER): Payer: Self-pay | Admitting: Orthopedic Surgery

## 2021-10-05 DIAGNOSIS — S52502A Unspecified fracture of the lower end of left radius, initial encounter for closed fracture: Secondary | ICD-10-CM | POA: Diagnosis not present

## 2021-11-02 DIAGNOSIS — S52502A Unspecified fracture of the lower end of left radius, initial encounter for closed fracture: Secondary | ICD-10-CM | POA: Diagnosis not present

## 2021-11-07 DIAGNOSIS — E871 Hypo-osmolality and hyponatremia: Secondary | ICD-10-CM | POA: Diagnosis not present

## 2021-11-07 DIAGNOSIS — I10 Essential (primary) hypertension: Secondary | ICD-10-CM | POA: Diagnosis not present

## 2021-11-07 DIAGNOSIS — Z79899 Other long term (current) drug therapy: Secondary | ICD-10-CM | POA: Diagnosis not present

## 2021-11-14 DIAGNOSIS — I10 Essential (primary) hypertension: Secondary | ICD-10-CM | POA: Diagnosis not present

## 2021-11-14 DIAGNOSIS — E871 Hypo-osmolality and hyponatremia: Secondary | ICD-10-CM | POA: Diagnosis not present

## 2021-11-18 ENCOUNTER — Other Ambulatory Visit (HOSPITAL_COMMUNITY): Payer: Self-pay | Admitting: Internal Medicine

## 2021-11-18 ENCOUNTER — Other Ambulatory Visit: Payer: Self-pay | Admitting: Internal Medicine

## 2021-11-18 DIAGNOSIS — Z122 Encounter for screening for malignant neoplasm of respiratory organs: Secondary | ICD-10-CM

## 2021-11-18 DIAGNOSIS — F17200 Nicotine dependence, unspecified, uncomplicated: Secondary | ICD-10-CM

## 2021-12-14 DIAGNOSIS — S52502D Unspecified fracture of the lower end of left radius, subsequent encounter for closed fracture with routine healing: Secondary | ICD-10-CM | POA: Diagnosis not present

## 2021-12-22 ENCOUNTER — Encounter (HOSPITAL_COMMUNITY): Payer: Self-pay

## 2021-12-22 ENCOUNTER — Ambulatory Visit (HOSPITAL_COMMUNITY): Admission: RE | Admit: 2021-12-22 | Payer: Medicare Other | Source: Ambulatory Visit

## 2022-03-27 ENCOUNTER — Emergency Department (HOSPITAL_COMMUNITY): Payer: Medicare Other

## 2022-03-27 ENCOUNTER — Other Ambulatory Visit: Payer: Self-pay

## 2022-03-27 ENCOUNTER — Encounter (HOSPITAL_COMMUNITY): Payer: Self-pay | Admitting: *Deleted

## 2022-03-27 ENCOUNTER — Emergency Department (HOSPITAL_COMMUNITY)
Admission: EM | Admit: 2022-03-27 | Discharge: 2022-03-27 | Disposition: A | Discharge status: Home / Self Care | Payer: Medicare Other | Attending: Emergency Medicine | Admitting: Emergency Medicine

## 2022-03-27 DIAGNOSIS — F10120 Alcohol abuse with intoxication, uncomplicated: Secondary | ICD-10-CM | POA: Insufficient documentation

## 2022-03-27 DIAGNOSIS — R4182 Altered mental status, unspecified: Secondary | ICD-10-CM | POA: Diagnosis not present

## 2022-03-27 DIAGNOSIS — Y92007 Garden or yard of unspecified non-institutional (private) residence as the place of occurrence of the external cause: Secondary | ICD-10-CM | POA: Diagnosis not present

## 2022-03-27 DIAGNOSIS — F10929 Alcohol use, unspecified with intoxication, unspecified: Secondary | ICD-10-CM

## 2022-03-27 DIAGNOSIS — W01198A Fall on same level from slipping, tripping and stumbling with subsequent striking against other object, initial encounter: Secondary | ICD-10-CM | POA: Insufficient documentation

## 2022-03-27 DIAGNOSIS — S0003XA Contusion of scalp, initial encounter: Secondary | ICD-10-CM | POA: Diagnosis not present

## 2022-03-27 DIAGNOSIS — S0001XA Abrasion of scalp, initial encounter: Secondary | ICD-10-CM | POA: Diagnosis not present

## 2022-03-27 DIAGNOSIS — S0990XA Unspecified injury of head, initial encounter: Secondary | ICD-10-CM | POA: Diagnosis not present

## 2022-03-27 DIAGNOSIS — W19XXXA Unspecified fall, initial encounter: Secondary | ICD-10-CM | POA: Diagnosis not present

## 2022-03-27 DIAGNOSIS — E871 Hypo-osmolality and hyponatremia: Secondary | ICD-10-CM | POA: Insufficient documentation

## 2022-03-27 DIAGNOSIS — R69 Illness, unspecified: Secondary | ICD-10-CM | POA: Diagnosis not present

## 2022-03-27 DIAGNOSIS — I10 Essential (primary) hypertension: Secondary | ICD-10-CM | POA: Diagnosis not present

## 2022-03-27 DIAGNOSIS — R9431 Abnormal electrocardiogram [ECG] [EKG]: Secondary | ICD-10-CM | POA: Diagnosis not present

## 2022-03-27 DIAGNOSIS — R5381 Other malaise: Secondary | ICD-10-CM | POA: Diagnosis not present

## 2022-03-27 LAB — CBC WITH DIFFERENTIAL/PLATELET
Abs Immature Granulocytes: 0.03 10*3/uL (ref 0.00–0.07)
Basophils Absolute: 0 10*3/uL (ref 0.0–0.1)
Basophils Relative: 1 %
Eosinophils Absolute: 1.1 10*3/uL — ABNORMAL HIGH (ref 0.0–0.5)
Eosinophils Relative: 17 %
HCT: 35.9 % — ABNORMAL LOW (ref 39.0–52.0)
Hemoglobin: 13 g/dL (ref 13.0–17.0)
Immature Granulocytes: 1 %
Lymphocytes Relative: 26 %
Lymphs Abs: 1.6 10*3/uL (ref 0.7–4.0)
MCH: 31.6 pg (ref 26.0–34.0)
MCHC: 36.2 g/dL — ABNORMAL HIGH (ref 30.0–36.0)
MCV: 87.1 fL (ref 80.0–100.0)
Monocytes Absolute: 0.4 10*3/uL (ref 0.1–1.0)
Monocytes Relative: 7 %
Neutro Abs: 3.1 10*3/uL (ref 1.7–7.7)
Neutrophils Relative %: 48 %
Platelets: 224 10*3/uL (ref 150–400)
RBC: 4.12 MIL/uL — ABNORMAL LOW (ref 4.22–5.81)
RDW: 13.3 % (ref 11.5–15.5)
WBC: 6.2 10*3/uL (ref 4.0–10.5)
nRBC: 0 % (ref 0.0–0.2)

## 2022-03-27 LAB — COMPREHENSIVE METABOLIC PANEL
ALT: 15 U/L (ref 0–44)
AST: 23 U/L (ref 15–41)
Albumin: 3.9 g/dL (ref 3.5–5.0)
Alkaline Phosphatase: 44 U/L (ref 38–126)
Anion gap: 10 (ref 5–15)
BUN: 5 mg/dL — ABNORMAL LOW (ref 8–23)
CO2: 20 mmol/L — ABNORMAL LOW (ref 22–32)
Calcium: 8.3 mg/dL — ABNORMAL LOW (ref 8.9–10.3)
Chloride: 95 mmol/L — ABNORMAL LOW (ref 98–111)
Creatinine, Ser: 0.49 mg/dL — ABNORMAL LOW (ref 0.61–1.24)
GFR, Estimated: >60 mL/min (ref >60)
Glucose, Bld: 92 mg/dL (ref 70–99)
Potassium: 3.2 mmol/L — ABNORMAL LOW (ref 3.5–5.1)
Sodium: 125 mmol/L — ABNORMAL LOW (ref 135–145)
Total Bilirubin: 0.4 mg/dL (ref 0.3–1.2)
Total Protein: 6.8 g/dL (ref 6.5–8.1)

## 2022-03-27 LAB — TROPONIN I (HIGH SENSITIVITY): Troponin I (High Sensitivity): 4 ng/L (ref <18)

## 2022-03-27 LAB — ETHANOL: Alcohol, Ethyl (B): 150 mg/dL — ABNORMAL HIGH (ref <10)

## 2022-03-27 MED ORDER — SODIUM CHLORIDE 0.9 % IV BOLUS
1000.0000 mL | Freq: Once | INTRAVENOUS | Status: AC
  Administered 2022-03-27: 1000 mL via INTRAVENOUS

## 2022-03-27 NOTE — ED Provider Notes (Incomplete)
[facility] EMERGENCY DEPARTMENT Provider Note   CSN: [id number] Arrival date & time: 03/27/22  1934     History {Add pertinent medical, surgical, social history, OB history to HPI:1} Chief Complaint  Patient presents with  . Fall    Kenneth Lester is a 66 y.o. male.  The history is provided by the patient.  Fall      Home Medications Prior to Admission medications   Medication Sig Start Date End Date Taking? Authorizing Provider  amLODipine (NORVASC) 10 MG tablet Take 10 mg by mouth daily.    [provider]  hydrochlorothiazide (HYDRODIURIL) 25 MG tablet Take 25 mg by mouth daily.    [provider]  ibuprofen (ADVIL) 600 MG tablet Take 1 tablet (600 mg total) by mouth 4 (four) times daily. Patient taking differently: Take 600 mg by mouth every 6 (six) hours as needed. 09/02/19   Ivery Quale, PA-C  losartan (COZAAR) 50 MG tablet Take 1 tablet by mouth daily. 10/24/18   [provider]      Allergies    Other    Review of Systems   Review of Systems  Physical Exam Updated Vital Signs BP 105/65   Pulse 75   Temp 98.4 F (36.9 C) (Oral)   Resp 14   SpO2 95%  Physical Exam  ED Results / Procedures / Treatments   Labs (all labs ordered are listed, but only abnormal results are displayed) Labs Reviewed  CBC WITH DIFFERENTIAL/PLATELET - Abnormal; Notable for the following components:      Result Value   RBC 4.12 (*)    HCT 35.9 (*)    MCHC 36.2 (*)    Eosinophils Absolute 1.1 (*)    All other components within normal limits  COMPREHENSIVE METABOLIC PANEL - Abnormal; Notable for the following components:   Sodium 125 (*)    Potassium 3.2 (*)    Chloride 95 (*)    CO2 20 (*)    BUN 5 (*)    Creatinine, Ser 0.49 (*)    Calcium 8.3 (*)    All other components within normal limits  ETHANOL - Abnormal; Notable for the following components:   Alcohol, Ethyl (B) 150 (*)    All other components within normal limits  TROPONIN I  (HIGH SENSITIVITY)    EKG None  Radiology CT Head Wo Contrast  Result Date: 03/27/2022 CLINICAL DATA:  Recent head injury EXAM: CT HEAD WITHOUT CONTRAST TECHNIQUE: Contiguous axial images were obtained from the base of the skull through the vertex without intravenous contrast. RADIATION DOSE REDUCTION: This exam was performed according to the departmental dose-optimization program which includes automated exposure control, adjustment of the mA and/or kV according to patient size and/or use of iterative reconstruction technique. COMPARISON:  09/10/2021 FINDINGS: Brain: No evidence of acute infarction, hemorrhage, hydrocephalus, extra-axial collection or mass lesion/mass effect. Chronic white matter ischemic changes are noted. Mild atrophic changes are noted as well. Vascular: No hyperdense vessel or unexpected calcification. Skull: Normal. Negative for fracture or focal lesion. Sinuses/Orbits: No acute finding. Other: Mild scalp hematoma is noted in the left posterior parietal region. IMPRESSION: Chronic white matter ischemic and atrophic changes without acute intracranial abnormality. Mild scalp hematoma posteriorly on the left as described. Electronically Signed   By: Alcide Clever M.D.   On: 03/27/2022 20:46    Procedures Procedures  {Document cardiac monitor, telemetry assessment procedure when appropriate:1}  Medications Ordered in ED Medications  sodium chloride 0.9 % bolus 1,000 mL (0  mLs Intravenous Stopped 03/27/22 2119)    ED Course/ Medical Decision Making/ A&P                           Medical Decision Making Amount and/or Complexity of Data Reviewed Labs: ordered. Radiology: ordered.   ***  {Document critical care time when appropriate:1} {Document review of labs and clinical decision tools ie heart score, Chads2Vasc2 etc:1}  {Document your independent review of radiology images, and any outside records:1} {Document your discussion with family members, caretakers, and with  consultants:1} {Document social determinants of health affecting pt's care:1} {Document your decision making why or why not admission, treatments were needed:1} Final Clinical Impression(s) / ED Diagnoses Final diagnoses:  None    Rx / DC Orders ED Discharge Orders     None

## 2022-03-27 NOTE — ED Triage Notes (Signed)
Pt arrives via [facility] from the street in front of his house. Bystander called b/c pt was lying in the road. Unknown amount of ETOH. Alert, not oriented. Hit his head (abrasion to forehead area). Per report, manual 156sbp, hr 70's, nsr, 98%, cbg 105

## 2022-03-27 NOTE — Discharge Instructions (Signed)
You have received some sodium in the IV fluids we gave you here tonight but you will need to have a repeat sodium lab test within the next several days to make sure your sodium level is better.  Call your doctor to arrange this test.  Your alcohol level is elevated tonight at 150 which is probably the reason for your fall this evening.  Your CT scan is stable with no sign of any head injury from today's fall.

## 2022-03-27 NOTE — ED Provider Notes (Signed)
[facility] EMERGENCY DEPARTMENT Provider Note   CSN: [id number] Arrival date & time: 03/27/22  1934     History  Chief Complaint  Patient presents with   Kenneth Lester is a 65 y.o. male with a history of etoh abuse, htn and need of cane use since right ankle injury in 2006 presenting for evaluation of fall which occurred prior to arrival.  Pt is clearly intoxicated at this time.  He and wife endorse he drinks beer daily but would quantify amount consumed today. He reports he was walking home from a store when he tripped and fell in the neighbors yard and was unable to get up.  He did hit his head and sustained a significant scalp hematoma.  He cannot state if he passed out during the event. He denies any complaint of pain except at his scalp. He denies anticoagulation medications. He denies cp, sob, abd pain, n/v, dizziness. He has had no tx prior to arrival.  The history is provided by the patient and the spouse.      Home Medications Prior to Admission medications   Medication Sig Start Date End Date Taking? Authorizing Provider  amLODipine (NORVASC) 10 MG tablet Take 10 mg by mouth daily.    [provider]  hydrochlorothiazide (HYDRODIURIL) 25 MG tablet Take 25 mg by mouth daily.    [provider]  ibuprofen (ADVIL) 600 MG tablet Take 1 tablet (600 mg total) by mouth 4 (four) times daily. Patient taking differently: Take 600 mg by mouth every 6 (six) hours as needed. 09/02/19   Ivery Quale, PA-C  losartan (COZAAR) 50 MG tablet Take 1 tablet by mouth daily. 10/24/18   [provider]      Allergies    Other    Review of Systems   Review of Systems  Respiratory: Negative.    Cardiovascular: Negative.   Gastrointestinal: Negative.   Musculoskeletal:  Positive for gait problem. Negative for arthralgias and back pain.       Chronic gait deficit  Skin:  Positive for wound.  Neurological:  Positive for headaches. Negative for tremors.   Psychiatric/Behavioral:  Positive for confusion.    Physical Exam Updated Vital Signs BP 105/65   Pulse 75   Temp 98.4 F (36.9 C) (Oral)   Resp 14   SpO2 95%  Physical Exam Vitals and nursing note reviewed.  Constitutional:      General: He is not in acute distress.    Appearance: He is well-developed.     Comments: Awake and interactive, clearly intoxicated  HENT:     Head: Normocephalic.     Comments: Abrasion/hematoma left parietal scalp, no laceration.      Right Ear: Tympanic membrane normal.     Left Ear: Tympanic membrane normal.     Mouth/Throat:     Mouth: Mucous membranes are moist.  Eyes:     Extraocular Movements: Extraocular movements intact.     Conjunctiva/sclera: Conjunctivae normal.     Pupils: Pupils are equal, round, and reactive to light.  Cardiovascular:     Rate and Rhythm: Normal rate and regular rhythm.     Heart sounds: Normal heart sounds.  Pulmonary:     Effort: Pulmonary effort is normal.     Breath sounds: Normal breath sounds. No wheezing.  Abdominal:     General: Bowel sounds are normal.     Palpations: Abdomen is soft.     Tenderness: There is no abdominal tenderness.  Musculoskeletal:        General: No tenderness or deformity. Normal range of motion.     Cervical back: Normal range of motion. No tenderness.     Comments: Moves all 4 extremities without deficit or pain.  Skin:    General: Skin is warm and dry.  Neurological:     Cranial Nerves: No cranial nerve deficit.     Sensory: No sensory deficit.     Gait: Gait abnormal.     Comments: Gait baseline confirmed by wife at bedside.    ED Results / Procedures / Treatments   Labs (all labs ordered are listed, but only abnormal results are displayed) Labs Reviewed  CBC WITH DIFFERENTIAL/PLATELET - Abnormal; Notable for the following components:      Result Value   RBC 4.12 (*)    HCT 35.9 (*)    MCHC 36.2 (*)    Eosinophils Absolute 1.1 (*)    All other components within  normal limits  COMPREHENSIVE METABOLIC PANEL - Abnormal; Notable for the following components:   Sodium 125 (*)    Potassium 3.2 (*)    Chloride 95 (*)    CO2 20 (*)    BUN 5 (*)    Creatinine, Ser 0.49 (*)    Calcium 8.3 (*)    All other components within normal limits  ETHANOL - Abnormal; Notable for the following components:   Alcohol, Ethyl (B) 150 (*)    All other components within normal limits  TROPONIN I (HIGH SENSITIVITY)    EKG None  Radiology CT Head Wo Contrast  Result Date: 03/27/2022 CLINICAL DATA:  Recent head injury EXAM: CT HEAD WITHOUT CONTRAST TECHNIQUE: Contiguous axial images were obtained from the base of the skull through the vertex without intravenous contrast. RADIATION DOSE REDUCTION: This exam was performed according to the departmental dose-optimization program which includes automated exposure control, adjustment of the mA and/or kV according to patient size and/or use of iterative reconstruction technique. COMPARISON:  09/10/2021 FINDINGS: Brain: No evidence of acute infarction, hemorrhage, hydrocephalus, extra-axial collection or mass lesion/mass effect. Chronic white matter ischemic changes are noted. Mild atrophic changes are noted as well. Vascular: No hyperdense vessel or unexpected calcification. Skull: Normal. Negative for fracture or focal lesion. Sinuses/Orbits: No acute finding. Other: Mild scalp hematoma is noted in the left posterior parietal region. IMPRESSION: Chronic white matter ischemic and atrophic changes without acute intracranial abnormality. Mild scalp hematoma posteriorly on the left as described. Electronically Signed   By: Alcide Clever M.D.   On: 03/27/2022 20:46    Procedures Procedures    Medications Ordered in ED Medications  sodium chloride 0.9 % bolus 1,000 mL (0 mLs Intravenous Stopped 03/27/22 2119)    ED Course/ Medical Decision Making/ A&P                           Medical Decision Making Pt with etoh intoxication who  fell and struck his head walking home from store.  He was intoxicated here but awake, ambulatory with his baseline gait per wife's confirmation, antalgic, favoring the right leg.  We discussed his hyponatremia and was treated with 1L of NS.  Discussed that this can be a complication of beer ingestion and suggested admission for further Na replacement.  Pt refused this tx plan.  He was advised he needs close f/u with his pcp this week for a recheck of his sodium level.  He and wife understand this request.  Amount and/or Complexity of Data Reviewed Labs: ordered.    Details: etoh elevated,  Na low at 125.  Given 1 L NS here, suspect this Na is improved from this level at time of dc. Radiology: ordered.    Details: CT negative for acute intracrainial injury.  Risk Decision regarding hospitalization. Diagnosis or treatment significantly limited by social determinants of health.           Final Clinical Impression(s) / ED Diagnoses Final diagnoses:  Alcoholic intoxication with complication [facility](HCC)  Hyponatremia    Rx / DC Orders ED Discharge Orders     None         Victoriano Laindol, Gaylene Moylan, PA-C 03/28/22 1100    Bethann BerkshireZammit, Joseph, MD 03/28/22 1103

## 2022-03-27 NOTE — ED Notes (Signed)
Patient transported to CT 

## 2022-04-05 DIAGNOSIS — E871 Hypo-osmolality and hyponatremia: Secondary | ICD-10-CM | POA: Diagnosis not present

## 2022-04-05 DIAGNOSIS — R55 Syncope and collapse: Secondary | ICD-10-CM | POA: Diagnosis not present

## 2022-05-03 DIAGNOSIS — E876 Hypokalemia: Secondary | ICD-10-CM | POA: Diagnosis not present

## 2022-05-03 DIAGNOSIS — E871 Hypo-osmolality and hyponatremia: Secondary | ICD-10-CM | POA: Diagnosis not present

## 2023-02-26 DIAGNOSIS — Z125 Encounter for screening for malignant neoplasm of prostate: Secondary | ICD-10-CM | POA: Diagnosis not present

## 2023-02-26 DIAGNOSIS — F1020 Alcohol dependence, uncomplicated: Secondary | ICD-10-CM | POA: Diagnosis not present

## 2023-02-26 DIAGNOSIS — E871 Hypo-osmolality and hyponatremia: Secondary | ICD-10-CM | POA: Diagnosis not present

## 2023-02-26 DIAGNOSIS — R269 Unspecified abnormalities of gait and mobility: Secondary | ICD-10-CM | POA: Diagnosis not present

## 2023-02-26 DIAGNOSIS — R55 Syncope and collapse: Secondary | ICD-10-CM | POA: Diagnosis not present

## 2023-02-26 DIAGNOSIS — Z79899 Other long term (current) drug therapy: Secondary | ICD-10-CM | POA: Diagnosis not present

## 2023-04-12 DIAGNOSIS — E871 Hypo-osmolality and hyponatremia: Secondary | ICD-10-CM | POA: Diagnosis not present

## 2023-04-12 DIAGNOSIS — I10 Essential (primary) hypertension: Secondary | ICD-10-CM | POA: Diagnosis not present

## 2023-04-12 DIAGNOSIS — F1021 Alcohol dependence, in remission: Secondary | ICD-10-CM | POA: Diagnosis not present

## 2023-04-30 ENCOUNTER — Other Ambulatory Visit: Payer: Self-pay

## 2023-04-30 ENCOUNTER — Inpatient Hospital Stay (HOSPITAL_COMMUNITY)
Admission: EM | Admit: 2023-04-30 | Discharge: 2023-05-03 | DRG: 641 | Disposition: A | Discharge status: Home Health | Payer: Medicare PPO | Attending: Internal Medicine | Admitting: Internal Medicine

## 2023-04-30 ENCOUNTER — Emergency Department (HOSPITAL_COMMUNITY): Payer: Medicare PPO

## 2023-04-30 ENCOUNTER — Encounter (HOSPITAL_COMMUNITY): Payer: Self-pay | Admitting: Emergency Medicine

## 2023-04-30 DIAGNOSIS — E871 Hypo-osmolality and hyponatremia: Secondary | ICD-10-CM | POA: Diagnosis present

## 2023-04-30 DIAGNOSIS — I6782 Cerebral ischemia: Secondary | ICD-10-CM | POA: Diagnosis not present

## 2023-04-30 DIAGNOSIS — F17210 Nicotine dependence, cigarettes, uncomplicated: Secondary | ICD-10-CM | POA: Diagnosis present

## 2023-04-30 DIAGNOSIS — D649 Anemia, unspecified: Secondary | ICD-10-CM | POA: Diagnosis not present

## 2023-04-30 DIAGNOSIS — Z79899 Other long term (current) drug therapy: Secondary | ICD-10-CM

## 2023-04-30 DIAGNOSIS — E876 Hypokalemia: Secondary | ICD-10-CM | POA: Diagnosis present

## 2023-04-30 DIAGNOSIS — F17200 Nicotine dependence, unspecified, uncomplicated: Secondary | ICD-10-CM | POA: Diagnosis not present

## 2023-04-30 DIAGNOSIS — F1011 Alcohol abuse, in remission: Secondary | ICD-10-CM | POA: Diagnosis present

## 2023-04-30 DIAGNOSIS — R42 Dizziness and giddiness: Secondary | ICD-10-CM | POA: Diagnosis not present

## 2023-04-30 DIAGNOSIS — I10 Essential (primary) hypertension: Secondary | ICD-10-CM | POA: Diagnosis present

## 2023-04-30 DIAGNOSIS — R2681 Unsteadiness on feet: Secondary | ICD-10-CM | POA: Diagnosis not present

## 2023-04-30 DIAGNOSIS — G9389 Other specified disorders of brain: Secondary | ICD-10-CM | POA: Diagnosis not present

## 2023-04-30 DIAGNOSIS — M6281 Muscle weakness (generalized): Secondary | ICD-10-CM | POA: Diagnosis not present

## 2023-04-30 LAB — BASIC METABOLIC PANEL
Anion gap: 8 (ref 5–15)
BUN: 8 mg/dL (ref 8–23)
CO2: 24 mmol/L (ref 22–32)
Calcium: 8.9 mg/dL (ref 8.9–10.3)
Chloride: 91 mmol/L — ABNORMAL LOW (ref 98–111)
Creatinine, Ser: 0.69 mg/dL (ref 0.61–1.24)
GFR, Estimated: >60 mL/min (ref >60)
Glucose, Bld: 90 mg/dL (ref 70–99)
Potassium: 3.4 mmol/L — ABNORMAL LOW (ref 3.5–5.1)
Sodium: 123 mmol/L — ABNORMAL LOW (ref 135–145)

## 2023-04-30 LAB — URINALYSIS, ROUTINE W REFLEX MICROSCOPIC
Bilirubin Urine: NEGATIVE
Glucose, UA: NEGATIVE mg/dL
Hgb urine dipstick: NEGATIVE
Ketones, ur: NEGATIVE mg/dL
Leukocytes,Ua: NEGATIVE
Nitrite: NEGATIVE
Protein, ur: NEGATIVE mg/dL
Specific Gravity, Urine: 1.009 (ref 1.005–1.030)
pH: 7 (ref 5.0–8.0)

## 2023-04-30 LAB — HEPATIC FUNCTION PANEL
ALT: 16 U/L (ref 0–44)
AST: 16 U/L (ref 15–41)
Albumin: 4.1 g/dL (ref 3.5–5.0)
Alkaline Phosphatase: 60 U/L (ref 38–126)
Bilirubin, Direct: <0.1 mg/dL (ref 0.0–0.2)
Total Bilirubin: 0.4 mg/dL (ref 0.3–1.2)
Total Protein: 7.3 g/dL (ref 6.5–8.1)

## 2023-04-30 LAB — CBC
HCT: 38.6 % — ABNORMAL LOW (ref 39.0–52.0)
Hemoglobin: 13.5 g/dL (ref 13.0–17.0)
MCH: 31 pg (ref 26.0–34.0)
MCHC: 35 g/dL (ref 30.0–36.0)
MCV: 88.7 fL (ref 80.0–100.0)
Platelets: 277 10*3/uL (ref 150–400)
RBC: 4.35 MIL/uL (ref 4.22–5.81)
RDW: 13.2 % (ref 11.5–15.5)
WBC: 6.8 10*3/uL (ref 4.0–10.5)
nRBC: 0 % (ref 0.0–0.2)

## 2023-04-30 LAB — ETHANOL: Alcohol, Ethyl (B): <10 mg/dL (ref <10)

## 2023-04-30 MED ORDER — SODIUM CHLORIDE 0.9 % IV BOLUS
1000.0000 mL | Freq: Once | INTRAVENOUS | Status: AC
  Administered 2023-04-30: 1000 mL via INTRAVENOUS

## 2023-04-30 NOTE — ED Notes (Signed)
Patient transported to CT via stretcher.

## 2023-04-30 NOTE — ED Notes (Signed)
Patient transported to room via wheelchair at this time. Patient provided with warm blanket at this time. Call light within reach. Support person at bedside.

## 2023-04-30 NOTE — ED Notes (Signed)
Idol, PA-C at bedside.

## 2023-04-30 NOTE — ED Triage Notes (Signed)
Pt c/o unsteady gait and feeling "wobbly" for over a year with worsening symptoms over the past several days. Pt ambulates with a cane at baseline. His wife states he had a fall with a concussion over a year ago and has not been steady on his feet ever since. Pt denies pain and has no other neuro symptoms.

## 2023-04-30 NOTE — ED Provider Notes (Signed)
EMERGENCY DEPARTMENT AT [facility] Provider Note   CSN: [id number] Arrival date & time: 04/30/23  1237     History  Chief Complaint  Patient presents with   Gait Problem    Kenneth Lester is a 67 y.o. male with a history of hypertension, vertigo, former alcoholic who has not drank in over a year presenting for evaluation of increased weakness and tremor, stating he has had worse difficulty ambulating over the past several days secondary to being unsteady on his feet.  Wife at bedside endorses that he is needed to use a cane for ambulation for the past year, he fell and had a significant head injury causing concussion after which he has needed to use a cane for ambulation.  He has noted significant difficulty ambulating over the past several days.  He denies focal weakness.  He has maintained a good appetite, no reason to suspect dehydration.  He denies headache, focal weakness nausea or vomiting, also denies chest pain, palpitations, shortness of breath.  The history is provided by the patient and the spouse.       Home Medications Prior to Admission medications   Medication Sig Start Date End Date Taking? Authorizing Provider  amLODipine (NORVASC) 10 MG tablet Take 10 mg by mouth daily.    [provider]  hydrochlorothiazide (HYDRODIURIL) 25 MG tablet Take 25 mg by mouth daily.    [provider]  ibuprofen (ADVIL) 600 MG tablet Take 1 tablet (600 mg total) by mouth 4 (four) times daily. Patient taking differently: Take 600 mg by mouth every 6 (six) hours as needed. 09/02/19   Ivery Quale, PA-C  losartan (COZAAR) 50 MG tablet Take 1 tablet by mouth daily. 10/24/18   [provider]      Allergies    Other    Review of Systems   Review of Systems  Constitutional:  Negative for chills and fever.  HENT:  Negative for congestion and sore throat.   Eyes: Negative.   Respiratory:  Negative for chest tightness and shortness of  breath.   Cardiovascular:  Negative for chest pain.  Gastrointestinal:  Negative for abdominal pain and nausea.  Genitourinary: Negative.   Musculoskeletal:  Positive for gait problem. Negative for arthralgias, joint swelling and neck pain.  Skin: Negative.  Negative for rash and wound.  Neurological:  Positive for weakness. Negative for dizziness, light-headedness, numbness and headaches.  Psychiatric/Behavioral: Negative.    All other systems reviewed and are negative.   Physical Exam Updated Vital Signs BP (!) 164/83   Pulse 70   Temp 98 F (36.7 C) (Oral)   Resp 18   Ht 5\' 8"  (1.727 m)   Wt 61.2 kg   SpO2 100%   BMI 20.53 kg/m  Physical Exam Vitals and nursing note reviewed.  Constitutional:      Appearance: He is well-developed.  HENT:     Head: Normocephalic and atraumatic.  Eyes:     Conjunctiva/sclera: Conjunctivae normal.     Comments: Disconjugate gaze, chronic confirmed by patient and wife.  Cardiovascular:     Rate and Rhythm: Normal rate and regular rhythm.     Heart sounds: Normal heart sounds.  Pulmonary:     Effort: Pulmonary effort is normal.     Breath sounds: Normal breath sounds. No wheezing.  Abdominal:     General: Bowel sounds are normal.     Palpations: Abdomen is soft.     Tenderness: There is no abdominal  tenderness.  Musculoskeletal:        General: Normal range of motion.     Cervical back: Normal range of motion.  Skin:    General: Skin is warm and dry.  Neurological:     General: No focal deficit present.     Mental Status: He is alert.     Cranial Nerves: Cranial nerves 2-12 are intact. No dysarthria or facial asymmetry.     Sensory: Sensation is intact.     Motor: No tremor or pronator drift.     Coordination: Heel to [facility] Test normal. Impaired rapid alternating movements.     Comments: Equal grip strength,  no pronator drift.  Slow RAM.      ED Results / Procedures / Treatments   Labs (all labs ordered are listed, but only  abnormal results are displayed) Labs Reviewed  BASIC METABOLIC PANEL - Abnormal; Notable for the following components:      Result Value   Sodium 123 (*)    Potassium 3.4 (*)    Chloride 91 (*)    All other components within normal limits  CBC - Abnormal; Notable for the following components:   HCT 38.6 (*)    All other components within normal limits  URINALYSIS, ROUTINE W REFLEX MICROSCOPIC  HEPATIC FUNCTION PANEL  ETHANOL    EKG None  Radiology No results found.  Procedures Procedures    Medications Ordered in ED Medications  sodium chloride 0.9 % bolus 1,000 mL (1,000 mLs Intravenous New Bag/Given 04/30/23 1651)    ED Course/ Medical Decision Making/ A&P                             Medical Decision Making Patient presenting with generalized increased weakness and difficulty of gait, although he has gait difficulties at baseline it has been worse over the past several days.  Symptoms are consistent with prior hyponatremia which he certainly has today, his sodium level is 123.  Normal saline 1 L has been ordered, he will need admission for further management of this condition.  Amount and/or Complexity of Data Reviewed Labs: ordered.    Details: Per above, sodium of 123, he also has a mild hypokalemia at 3.4.  His chloride is also reduced at 91.  WBC is normal at 6.8, he has a hemoglobin of 13.5.  Urinalysis negative. Discussion of management or test interpretation with external provider(s): Call placed to the hospitalist for admission. Discussed sx and hyponatremia.  Will plan admission but would like head CT prior to seeing pt.    Risk Decision regarding hospitalization.           Final Clinical Impression(s) / ED Diagnoses Final diagnoses:  Hyponatremia    Rx / DC Orders ED Discharge Orders     None         Victoriano Lain 04/30/23 Gayla Doss, MD 04/30/23 [phone]

## 2023-05-01 DIAGNOSIS — E876 Hypokalemia: Secondary | ICD-10-CM

## 2023-05-01 DIAGNOSIS — E871 Hypo-osmolality and hyponatremia: Secondary | ICD-10-CM | POA: Diagnosis present

## 2023-05-01 DIAGNOSIS — F1011 Alcohol abuse, in remission: Secondary | ICD-10-CM | POA: Insufficient documentation

## 2023-05-01 DIAGNOSIS — F17210 Nicotine dependence, cigarettes, uncomplicated: Secondary | ICD-10-CM | POA: Diagnosis present

## 2023-05-01 DIAGNOSIS — R42 Dizziness and giddiness: Secondary | ICD-10-CM | POA: Diagnosis not present

## 2023-05-01 DIAGNOSIS — I10 Essential (primary) hypertension: Secondary | ICD-10-CM

## 2023-05-01 DIAGNOSIS — Z79899 Other long term (current) drug therapy: Secondary | ICD-10-CM | POA: Diagnosis not present

## 2023-05-01 DIAGNOSIS — F17200 Nicotine dependence, unspecified, uncomplicated: Secondary | ICD-10-CM

## 2023-05-01 LAB — BASIC METABOLIC PANEL
Anion gap: 6 (ref 5–15)
Anion gap: 6 (ref 5–15)
Anion gap: 5 (ref 5–15)
BUN: 8 mg/dL (ref 8–23)
BUN: 7 mg/dL — ABNORMAL LOW (ref 8–23)
BUN: 7 mg/dL — ABNORMAL LOW (ref 8–23)
CO2: 22 mmol/L (ref 22–32)
CO2: 23 mmol/L (ref 22–32)
CO2: 21 mmol/L — ABNORMAL LOW (ref 22–32)
Calcium: 8.5 mg/dL — ABNORMAL LOW (ref 8.9–10.3)
Calcium: 8.6 mg/dL — ABNORMAL LOW (ref 8.9–10.3)
Calcium: 8.3 mg/dL — ABNORMAL LOW (ref 8.9–10.3)
Chloride: 99 mmol/L (ref 98–111)
Chloride: 99 mmol/L (ref 98–111)
Chloride: 101 mmol/L (ref 98–111)
Creatinine, Ser: 0.54 mg/dL — ABNORMAL LOW (ref 0.61–1.24)
Creatinine, Ser: 0.65 mg/dL (ref 0.61–1.24)
Creatinine, Ser: 0.64 mg/dL (ref 0.61–1.24)
GFR, Estimated: >60 mL/min (ref >60)
GFR, Estimated: >60 mL/min (ref >60)
GFR, Estimated: >60 mL/min (ref >60)
Glucose, Bld: 88 mg/dL (ref 70–99)
Glucose, Bld: 94 mg/dL (ref 70–99)
Glucose, Bld: 109 mg/dL — ABNORMAL HIGH (ref 70–99)
Potassium: 4.2 mmol/L (ref 3.5–5.1)
Potassium: 4.3 mmol/L (ref 3.5–5.1)
Potassium: 4 mmol/L (ref 3.5–5.1)
Sodium: 127 mmol/L — ABNORMAL LOW (ref 135–145)
Sodium: 128 mmol/L — ABNORMAL LOW (ref 135–145)
Sodium: 127 mmol/L — ABNORMAL LOW (ref 135–145)

## 2023-05-01 LAB — CBC WITH DIFFERENTIAL/PLATELET
Abs Immature Granulocytes: 0.01 10*3/uL (ref 0.00–0.07)
Basophils Absolute: 0 10*3/uL (ref 0.0–0.1)
Basophils Relative: 0 %
Eosinophils Absolute: 0.9 10*3/uL — ABNORMAL HIGH (ref 0.0–0.5)
Eosinophils Relative: 14 %
HCT: 35.3 % — ABNORMAL LOW (ref 39.0–52.0)
Hemoglobin: 12.5 g/dL — ABNORMAL LOW (ref 13.0–17.0)
Immature Granulocytes: 0 %
Lymphocytes Relative: 22 %
Lymphs Abs: 1.5 10*3/uL (ref 0.7–4.0)
MCH: 31.6 pg (ref 26.0–34.0)
MCHC: 35.4 g/dL (ref 30.0–36.0)
MCV: 89.1 fL (ref 80.0–100.0)
Monocytes Absolute: 0.5 10*3/uL (ref 0.1–1.0)
Monocytes Relative: 8 %
Neutro Abs: 3.8 10*3/uL (ref 1.7–7.7)
Neutrophils Relative %: 56 %
Platelets: 248 10*3/uL (ref 150–400)
RBC: 3.96 MIL/uL — ABNORMAL LOW (ref 4.22–5.81)
RDW: 13.2 % (ref 11.5–15.5)
WBC: 6.8 10*3/uL (ref 4.0–10.5)
nRBC: 0 % (ref 0.0–0.2)

## 2023-05-01 LAB — MAGNESIUM: Magnesium: 2.3 mg/dL (ref 1.7–2.4)

## 2023-05-01 LAB — HIV ANTIBODY (ROUTINE TESTING W REFLEX): HIV Screen 4th Generation wRfx: NONREACTIVE

## 2023-05-01 LAB — TSH: TSH: 2.79 u[IU]/mL (ref 0.350–4.500)

## 2023-05-01 MED ORDER — AMLODIPINE BESYLATE 5 MG PO TABS
10.0000 mg | ORAL_TABLET | Freq: Every day | ORAL | Status: DC
  Administered 2023-05-01 – 2023-05-03 (×3): 10 mg via ORAL
  Filled 2023-05-01 (×3): qty 2

## 2023-05-01 MED ORDER — NICOTINE 21 MG/24HR TD PT24
21.0000 mg | MEDICATED_PATCH | Freq: Every day | TRANSDERMAL | Status: DC
  Administered 2023-05-01: 21 mg via TRANSDERMAL
  Filled 2023-05-01 (×2): qty 1

## 2023-05-01 MED ORDER — LORAZEPAM 1 MG PO TABS: 1.0000 mg | ORAL_TABLET | ORAL | Status: DC | PRN

## 2023-05-01 MED ORDER — POTASSIUM CHLORIDE 20 MEQ PO PACK
40.0000 meq | PACK | Freq: Once | ORAL | Status: AC
  Administered 2023-05-01: 40 meq via ORAL
  Filled 2023-05-01: qty 2

## 2023-05-01 MED ORDER — ONDANSETRON HCL 4 MG PO TABS: 4.0000 mg | ORAL_TABLET | Freq: Four times a day (QID) | ORAL | Status: DC | PRN

## 2023-05-01 MED ORDER — OXYCODONE HCL 5 MG PO TABS: 5.0000 mg | ORAL_TABLET | ORAL | Status: DC | PRN

## 2023-05-01 MED ORDER — ADULT MULTIVITAMIN W/MINERALS CH
1.0000 | ORAL_TABLET | Freq: Every day | ORAL | Status: DC
  Administered 2023-05-02 – 2023-05-03 (×2): 1 via ORAL
  Filled 2023-05-01 (×3): qty 1

## 2023-05-01 MED ORDER — SODIUM CHLORIDE 1 G PO TABS: 1.0000 g | ORAL_TABLET | Freq: Two times a day (BID) | ORAL | Status: DC

## 2023-05-01 MED ORDER — LOSARTAN POTASSIUM 50 MG PO TABS
50.0000 mg | ORAL_TABLET | Freq: Every day | ORAL | Status: DC
  Administered 2023-05-01 – 2023-05-03 (×3): 50 mg via ORAL
  Filled 2023-05-01 (×3): qty 1

## 2023-05-01 MED ORDER — SODIUM CHLORIDE 0.9 % IV SOLN: INTRAVENOUS | Status: DC

## 2023-05-01 MED ORDER — ONDANSETRON HCL 4 MG/2ML IJ SOLN: 4.0000 mg | Freq: Four times a day (QID) | INTRAMUSCULAR | Status: DC | PRN

## 2023-05-01 MED ORDER — HEPARIN SODIUM (PORCINE) 5000 UNIT/ML IJ SOLN
5000.0000 [IU] | Freq: Three times a day (TID) | INTRAMUSCULAR | Status: DC
  Administered 2023-05-01 – 2023-05-03 (×3): 5000 [IU] via SUBCUTANEOUS
  Filled 2023-05-01 (×5): qty 1

## 2023-05-01 MED ORDER — ACETAMINOPHEN 650 MG RE SUPP: 650.0000 mg | Freq: Four times a day (QID) | RECTAL | Status: DC | PRN

## 2023-05-01 MED ORDER — THIAMINE HCL 100 MG/ML IJ SOLN: 100.0000 mg | Freq: Every day | INTRAMUSCULAR | Status: DC

## 2023-05-01 MED ORDER — ACETAMINOPHEN 325 MG PO TABS: 650.0000 mg | ORAL_TABLET | Freq: Four times a day (QID) | ORAL | Status: DC | PRN

## 2023-05-01 MED ORDER — THIAMINE MONONITRATE 100 MG PO TABS
100.0000 mg | ORAL_TABLET | Freq: Every day | ORAL | Status: DC
  Administered 2023-05-02 – 2023-05-03 (×2): 100 mg via ORAL
  Filled 2023-05-01 (×3): qty 1

## 2023-05-01 MED ORDER — FOLIC ACID 1 MG PO TABS
1.0000 mg | ORAL_TABLET | Freq: Every day | ORAL | Status: DC
  Administered 2023-05-02 – 2023-05-03 (×2): 1 mg via ORAL
  Filled 2023-05-01 (×3): qty 1

## 2023-05-01 NOTE — Assessment & Plan Note (Signed)
-   Potassium 3.4 - 40 mEq potassium given - Continue to monitor

## 2023-05-01 NOTE — ED Notes (Signed)
ED TO INPATIENT HANDOFF REPORT  ED Nurse Name and Phone #: Johnney Killian Name/Age/Gender Dagmar Hait 67 y.o. male Room/Bed: APA14/APA14  Code Status   Code Status: Full Code  Home/SNF/Other Home Patient oriented to: self,place,time,situation Is this baseline? Yes   Triage Complete: Triage complete  Chief Complaint Acute hyponatremia [E87.1]  Triage Note Pt c/o unsteady gait and feeling "wobbly" for over a year with worsening symptoms over the past several days. Pt ambulates with a cane at baseline. His wife states he had a fall with a concussion over a year ago and has not been steady on his feet ever since. Pt denies pain and has no other neuro symptoms.    Allergies Allergies  Allergen Reactions   Other Other (See Comments)    Patient states he was allergic to something he was given in an IV at the hospital when being treated for pneumonia, but he doesn't remember what the name of the medication was. 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    Level of Care/Admitting Diagnosis ED Disposition     ED Disposition  Admit   Condition  --   Comment  Hospital Area: [facility] [[id number]]  Level of Care: Telemetry [5]  Covid Evaluation: Asymptomatic - no recent exposure (last 10 days) testing not required  Diagnosis: Acute hyponatremia [161096]  Admitting Physician: Lilyan Gilford [[id number]]  Attending Physician: Lilyan Gilford [[id number]]          B Medical/Surgery History Past Medical History:  Diagnosis Date   Alcohol abuse    Hypertension    Use of cane as ambulatory aid    Vertigo    Past Surgical History:  Procedure Laterality Date   COLONOSCOPY  2009   Dr. Jena Gauss: difficult prep (patient ate solid food during prep). single hyperplastic polyps removed.   MOUTH SURGERY     teeth pulled for dentures, pt unsure of date   ORIF WRIST FRACTURE Left 09/22/2021   Procedure: OPEN REDUCTION INTERNAL FIXATION (ORIF) WRIST FRACTURE;  Surgeon:  Gomez Cleverly, MD;  Location: [facility];  Service: Orthopedics;  Laterality: Left;  with MAC   right ankle surgery  2006   forklift injury     A IV Location/Drains/Wounds Patient Lines/Drains/Airways Status     Active Line/Drains/Airways     Name Placement date Placement time Site Days   Peripheral IV 04/30/23 20 G 1" Anterior;Distal;Right;Upper Antecubital 04/30/23  1447  Antecubital  1            Intake/Output Last 24 hours  Intake/Output Summary (Last 24 hours) at 05/01/2023 0209 Last data filed at 04/30/2023 1950 Gross per 24 hour  Intake 1000 ml  Output --  Net 1000 ml    Labs/Imaging Results for orders placed or performed during the hospital encounter of 04/30/23 (from the past 48 hour(s))  Basic metabolic panel     Status: Abnormal   Collection Time: 04/30/23 12:58 PM  Result Value Ref Range   Sodium 123 (L) 135 - 145 mmol/L   Potassium 3.4 (L) 3.5 - 5.1 mmol/L   Chloride 91 (L) 98 - 111 mmol/L   CO2 24 22 - 32 mmol/L   Glucose, Bld 90 70 - 99 mg/dL    Comment: Glucose reference range applies only to samples taken after fasting for at least 8 hours.   BUN 8 8 - 23 mg/dL   Creatinine, Ser 2.95 0.61 - 1.24 mg/dL   Calcium 8.9 8.9 -  10.3 mg/dL   GFR, Estimated >16 >10 mL/min    Comment: (NOTE) Calculated using the CKD-EPI Creatinine Equation (2021)    Anion gap 8 5 - 15    Comment: Performed at [facility], [address] [place], [place] [address]  CBC     Status: Abnormal   Collection Time: 04/30/23 12:58 PM  Result Value Ref Range   WBC 6.8 4.0 - 10.5 K/uL   RBC 4.35 4.22 - 5.81 MIL/uL   Hemoglobin 13.5 13.0 - 17.0 g/dL   HCT 40.9 (L) 81.1 - 91.4 %   MCV 88.7 80.0 - 100.0 fL   MCH 31.0 26.0 - 34.0 pg   MCHC 35.0 30.0 - 36.0 g/dL   RDW 78.2 95.6 - 21.3 %   Platelets 277 150 - 400 K/uL   nRBC 0.0 0.0 - 0.2 %    Comment: Performed at [facility], [address], [place], [place] [address]  Hepatic function panel     Status: None    Collection Time: 04/30/23  4:46 PM  Result Value Ref Range   Total Protein 7.3 6.5 - 8.1 g/dL   Albumin 4.1 3.5 - 5.0 g/dL   AST 16 15 - 41 U/L   ALT 16 0 - 44 U/L   Alkaline Phosphatase 60 38 - 126 U/L   Total Bilirubin 0.4 0.3 - 1.2 mg/dL   Bilirubin, Direct <8.4 0.0 - 0.2 mg/dL   Indirect Bilirubin NOT CALCULATED 0.3 - 0.9 mg/dL    Comment: Performed at [facility], [address] [place], [place] [address]  Ethanol     Status: None   Collection Time: 04/30/23  4:46 PM  Result Value Ref Range   Alcohol, Ethyl (B) <10 <10 mg/dL    Comment: (NOTE) Lowest detectable limit for serum alcohol is 10 mg/dL.  For medical purposes only. Performed at [facility], [address] [place], [place] [address]   Urinalysis, Routine w reflex microscopic -Urine, Clean Catch     Status: None   Collection Time: 04/30/23  4:49 PM  Result Value Ref Range   Color, Urine YELLOW YELLOW   APPearance CLEAR CLEAR   Specific Gravity, Urine 1.009 1.005 - 1.030   pH 7.0 5.0 - 8.0   Glucose, UA NEGATIVE NEGATIVE mg/dL   Hgb urine dipstick NEGATIVE NEGATIVE   Bilirubin Urine NEGATIVE NEGATIVE   Ketones, ur NEGATIVE NEGATIVE mg/dL   Protein, ur NEGATIVE NEGATIVE mg/dL   Nitrite NEGATIVE NEGATIVE   Leukocytes,Ua NEGATIVE NEGATIVE    Comment: Performed at [facility], [address] [place], [place] [address]   CT Head Wo Contrast  Result Date: 04/30/2023 CLINICAL DATA:  Worsening unsteady gait. EXAM: CT HEAD WITHOUT CONTRAST TECHNIQUE: Contiguous axial images were obtained from the base of the skull through the vertex without intravenous contrast. RADIATION DOSE REDUCTION: This exam was performed according to the departmental dose-optimization program which includes automated exposure control, adjustment of the mA and/or kV according to patient size and/or use of iterative reconstruction technique. COMPARISON:  March 27, 2022 FINDINGS: Brain: There is moderate severity cerebral atrophy with widening of the  extra-axial spaces and ventricular dilatation. There are areas of decreased attenuation within the white matter tracts of the supratentorial brain, consistent with microvascular disease changes. Vascular: No hyperdense vessel or unexpected calcification. Skull: Normal. Negative for fracture or focal lesion. Sinuses/Orbits: There is mild left ethmoid sinus mucosal thickening. Other: None. IMPRESSION: 1. Generalized cerebral atrophy with chronic white matter small vessel ischemic changes. 2.  No acute intracranial abnormality. 3. Mild left ethmoid sinus disease. Electronically Signed   By: Aram Candela M.D.   On: 04/30/2023 19:47    Pending Labs [employer] (From admission, onward)     Start     Ordered   Signed and Held  HIV Antibody (routine testing w rflx)  (HIV Antibody (Routine testing w reflex) panel)  Once,   R        Signed and Held   Signed and Held  Magnesium  Tomorrow morning,   R        Signed and Held   Signed and Held  Basic metabolic panel  Now then every 4 hours,   R      Signed and Held   Signed and Held  CBC with Differential/Platelet  Tomorrow morning,   R        Signed and Held   Signed and Held  TSH  Tomorrow morning,   R        Signed and Held            Vitals/Pain Today's Vitals   04/30/23 2230 04/30/23 2245 04/30/23 2257 04/30/23 2300  BP: (!) 115/56 (!) 117/57  (!) 130/91  Pulse: 61 65  84  Resp:      Temp:      TempSrc:      SpO2: 98% 98%  95%  Weight:      Height:      PainSc:   0-No pain     Isolation Precautions No active isolations  Medications Medications  sodium chloride 0.9 % bolus 1,000 mL (0 mLs Intravenous Stopped 04/30/23 1950)    Mobility Wheelchair      Focused Assessments Neuro Assessment Handoff:  Swallow screen pass? No          Neuro Assessment: Within Defined Limits Neuro Checks:      Has TPA been given? No If patient is a Neuro Trauma and patient is going to OR before floor call report to 4N Charge nurse:  [phone] or [phone]   R Recommendations: See Admitting Provider Note  Report given to:   Additional Notes:

## 2023-05-01 NOTE — Progress Notes (Signed)
   05/01/23 2112  Provider Notification  Provider Name/Title Dr. Carren Rang  Date Provider Notified 05/01/23  Time Provider Notified 2113  Method of Notification  (secure chat)  Notification Reason Red med refusal  Provider response No new orders  Date of Provider Response 05/01/23

## 2023-05-01 NOTE — Assessment & Plan Note (Signed)
Continue amlodipine, losartan ?

## 2023-05-01 NOTE — TOC CM/SW Note (Signed)
Transition of Care [facility]) - Inpatient Brief Assessment   Patient Details  Name: Kenneth Lester MRN: [id number] Date of Birth: [birth year]  Transition of [facility] CM/SW Contact:    Villa Herb, LCSWA Phone Number: 05/01/2023, 3:56 PM   Clinical Narrative: PT eval pending at this time, TOC will continue to follow for recommendations and needs prior to D/C.   Transition of Care Asessment: Insurance and Status: Insurance coverage has been reviewed Patient has primary care physician: Yes Home environment has been reviewed: from home with wife Prior level of function:: some assistance Prior/Current Home Services: No current home services Social Determinants of Health Reivew: SDOH reviewed no interventions necessary Readmission risk has been reviewed: Yes Transition of care needs: transition of care needs identified, TOC will continue to follow

## 2023-05-01 NOTE — H&P (Signed)
History and Physical    Patient: Kenneth Lester AOZ:[id number] DOB: 09/15/1956 DOA: 04/30/2023 DOS: the patient was seen and examined on 05/01/2023 PCP: Carylon Perches, MD  Patient coming from: Home  Chief Complaint:  Chief Complaint  Patient presents with   Gait Problem   HPI: Kenneth Lester is a 67 y.o. male with medical history significant of alcohol abuse, hypertension, vertigo, and more presents ED with a chief complaint of dizziness.  Unfortunately at the time of my exam, patient reports that he cannot answer any more questions.  He reports he has been at the hospital since noon on the eighth and has not gotten any sleep yet.  From chart review appears the patient came in for feeling unsteady on his feet.  The symptoms have been worsening over the last several days.  Patient ambulates with a cane at baseline.  Wife was at bedside and reported the patient had had a fall with concussion a year ago and has been unsteady since.  Patient denies any pain.  Patient denied focal weakness at presentation.  He also reports that he has a good appetite, and appropriate p.o. liquid intake.  At that time patient had denied headaches, nausea, vomiting, chest pain, palpitations, shortness of breath.  Again this history is all taken from chart review.  Patient is not participating in the exam and has no ACP documents on file.  He will be full code by default Review of Systems: Unable to review all systems due to lack of cooperation from patient. Past Medical History:  Diagnosis Date   Alcohol abuse    Hypertension    Use of cane as ambulatory aid    Vertigo    Past Surgical History:  Procedure Laterality Date   COLONOSCOPY  2009   Dr. Jena Gauss: difficult prep (patient ate solid food during prep). single hyperplastic polyps removed.   MOUTH SURGERY     teeth pulled for dentures, pt unsure of date   ORIF WRIST FRACTURE Left 09/22/2021   Procedure: OPEN REDUCTION INTERNAL FIXATION (ORIF) WRIST FRACTURE;   Surgeon: Gomez Cleverly, MD;  Location: [facility];  Service: Orthopedics;  Laterality: Left;  with MAC   right ankle surgery  2006   forklift injury   Social History:  reports that he has been smoking cigarettes. He has a 50.00 pack-year smoking history. He has never used smokeless tobacco. He reports current alcohol use of about 7.0 standard drinks of alcohol per week. He reports that he does not use drugs.  Allergies  Allergen Reactions   Other Other (See Comments)    Patient states he was allergic to something he was given in an IV at the hospital when being treated for pneumonia, but he doesn't remember what the name of the medication was. 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    Family History  Problem Relation Age of Onset   Colon cancer Neg Hx     Prior to Admission medications   Medication Sig Start Date End Date Taking? Authorizing Provider  amLODipine (NORVASC) 10 MG tablet Take 10 mg by mouth daily.    [provider]  hydrochlorothiazide (HYDRODIURIL) 25 MG tablet Take 25 mg by mouth daily.    [provider]  ibuprofen (ADVIL) 600 MG tablet Take 1 tablet (600 mg total) by mouth 4 (four) times daily. Patient taking differently: Take 600 mg by mouth every 6 (six) hours as needed. 09/02/19   Ivery Quale, PA-C  losartan (  COZAAR) 50 MG tablet Take 1 tablet by mouth daily. 10/24/18   [provider]    Physical Exam: Vitals:   04/30/23 2230 04/30/23 2245 04/30/23 2300 05/01/23 0231  BP: (!) 115/56 (!) 117/57 (!) 130/91 (!) 146/73  Pulse: 61 65 84 66  Resp:    18  Temp:    98.2 F (36.8 C)  TempSrc:    Oral  SpO2: 98% 98% 95% 100%  Weight:    62.4 kg  Height:       1.  General: Patient lying right lateral decubitus in bed, no acute distress   2. Psychiatric: Tired and oriented, mood is irritable, unfortunately not cooperating with the exam at this time   3. Neurologic: Speech and language are normal, face is  symmetric, moves all 4 extremities voluntarily, at baseline without acute deficits on limited exam   4. HEENMT:  Head is atraumatic, normocephalic, pupils reactive to light, neck is supple, trachea is midline, mucous membranes are moist   5. Respiratory : Lungs are clear to auscultation bilaterally without wheezing, rhonchi, rales, no cyanosis, no increase in work of breathing or accessory muscle use   6. Cardiovascular : Heart rate normal, rhythm is regular, no murmurs, rubs or gallops, no peripheral edema, peripheral pulses palpated   7. Gastrointestinal:  Abdomen is soft, nondistended, nontender to palpation bowel sounds active, no masses or organomegaly palpated   8. 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 lab is pending - Patient was given a liter bolus in the ED - Continue fluids - I do not think he will need sodium tabs but will see when the BMP is back - Will check urine electrolytes - Monitor orthostatic vital signs - Continue to  monitor  History of alcohol abuse - Since patient will not participate in exam, I do not know if he is still drinking - CIWA ordered to err on the side of caution  Dizziness - Possibly related to hyponatremia - CT head showed generalized cerebral atrophy with chronic white matter small vessel ischemic changes - Continue to treat hyponatremia monitor clinical progress  Essential hypertension - Continue amlodipine, losartan  Hypokalemia - Potassium 3.4 - 40 mEq potassium given - Continue to monitor  Tobacco use disorder - Patient smokes a pack per day - Will need counseling when he is more awake - Nicotine patch ordered      Advance Care Planning:   Code Status: Full Code  Consults: No consults at this time  Family Communication: No family at bedside  Severity of Illness: The appropriate  patient status for this patient is OBSERVATION. Observation status is judged to be reasonable and necessary in order to provide the required intensity of service to ensure the patient's safety. The patient's presenting symptoms, physical exam findings, and initial radiographic and laboratory data in the context of their medical condition is felt to place them at decreased risk for further clinical deterioration. Furthermore, it is anticipated that the patient will be medically stable for discharge from the hospital within 2 midnights of admission.   Author: Lilyan Gilford, DO 05/01/2023 5:46 AM  For on call review [email or web address].

## 2023-05-01 NOTE — Assessment & Plan Note (Signed)
-   Since patient will not participate in exam, I do not know if he is still drinking - CIWA ordered to err on the side of caution

## 2023-05-01 NOTE — Progress Notes (Signed)
Patient refused several medications during shift, see MAR. Md Elvera Lennox made aware. Patient ambulated in room with assistance, patient reported some dizziness at times if standing for long periods. Orthostatic vitals obtained, see flowsheet. Patient tolerated medication whole with no complaints.

## 2023-05-01 NOTE — Assessment & Plan Note (Addendum)
-   Sodium is down to 123 - Patient reports feeling unsteady on his feet and dizzy - Monitor every 4 hours BMP-these seem to have not been drawn through the night, but the a.m. lab is pending - Patient was given a liter bolus in the ED - Continue fluids - I do not think he will need sodium tabs but will see when the BMP is back - Will check urine electrolytes - Monitor orthostatic vital signs - Continue to monitor

## 2023-05-01 NOTE — Progress Notes (Signed)
Mobility Specialist Progress Note:    05/01/23 1505  Mobility  Activity Ambulated with assistance in room  Level of Assistance Contact guard assist, steadying assist  Assistive Device Front wheel walker  Distance Ambulated (ft) 28 ft  Range of Motion/Exercises Active;All extremities  Activity Response Tolerated well  Mobility Referral Yes  $Mobility charge 1 Mobility  Mobility Specialist Start Time (ACUTE ONLY) 1445  Mobility Specialist Stop Time (ACUTE ONLY) 1500  Mobility Specialist Time Calculation (min) (ACUTE ONLY) 15 min   Pt received in bed, agreeable to mobility session with slight encouragement. Pt has a cane from home, but prefers using the RW in room, feels safer and more stable. Ambulated in room with RW and CGA for safety. Tolerated well, asx throughout. Pt sitting up in chair, alarm on, call bell in reach, all needs met.   Feliciana Rossetti Mobility Specialist Please contact via Special educational needs teacher or  Rehab office at [phone]

## 2023-05-01 NOTE — Assessment & Plan Note (Signed)
-   Possibly related to hyponatremia - CT head showed generalized cerebral atrophy with chronic white matter small vessel ischemic changes - Continue to treat hyponatremia monitor clinical progress

## 2023-05-01 NOTE — Assessment & Plan Note (Signed)
-   Patient smokes a pack per day - Will need counseling when he is more awake - Nicotine patch ordered

## 2023-05-01 NOTE — Progress Notes (Signed)
No charge note  Patient seen and examined this morning, H&P reviewed and agree with the assessment and plan.  67 year old male with prior history of EtOH use, in remission since 2023, HTN, vertigo comes into the hospital with dizziness and weakness.  This has been going on for the past several days, feeling very unsteady and tells me he has difficulties doing his ADLs, including eating.  Patient's wife reported that patient had a fall with a concussion about a year ago and has been unsteady since.  In the ED he was found to be hyponatremic, placed on fluids and admitted to the hospital  Patient tells me this morning that it has been increasingly difficult to manage things at home in the last several days due to feeling extremely weak.  He was found to be hyponatremic which is not necessarily new as he has had low sodium in the past.  Sodium was 123 on admission, with fluid this seems to be improving and is 128 this morning.  Still looks dry, continue IV fluids for another day.  Obtain physical therapy consultation.  Cedrick Partain M. Elvera Lennox, MD, PhD [facility]  Between 7 am - 7 pm you can contact me via Amion (for emergencies) or Securechat (non urgent matters).  I am not available 7 pm - 7 am, please contact night coverage MD/APP via Amion

## 2023-05-02 DIAGNOSIS — E871 Hypo-osmolality and hyponatremia: Secondary | ICD-10-CM | POA: Diagnosis not present

## 2023-05-02 LAB — CBC
HCT: 34 % — ABNORMAL LOW (ref 39.0–52.0)
Hemoglobin: 11.9 g/dL — ABNORMAL LOW (ref 13.0–17.0)
MCH: 31.5 pg (ref 26.0–34.0)
MCHC: 35 g/dL (ref 30.0–36.0)
MCV: 89.9 fL (ref 80.0–100.0)
Platelets: 237 10*3/uL (ref 150–400)
RBC: 3.78 MIL/uL — ABNORMAL LOW (ref 4.22–5.81)
RDW: 13.4 % (ref 11.5–15.5)
WBC: 5.3 10*3/uL (ref 4.0–10.5)
nRBC: 0 % (ref 0.0–0.2)

## 2023-05-02 LAB — COMPREHENSIVE METABOLIC PANEL
ALT: 14 U/L (ref 0–44)
AST: 14 U/L — ABNORMAL LOW (ref 15–41)
Albumin: 3.3 g/dL — ABNORMAL LOW (ref 3.5–5.0)
Alkaline Phosphatase: 49 U/L (ref 38–126)
Anion gap: 5 (ref 5–15)
BUN: 7 mg/dL — ABNORMAL LOW (ref 8–23)
CO2: 21 mmol/L — ABNORMAL LOW (ref 22–32)
Calcium: 8.3 mg/dL — ABNORMAL LOW (ref 8.9–10.3)
Chloride: 104 mmol/L (ref 98–111)
Creatinine, Ser: 0.58 mg/dL — ABNORMAL LOW (ref 0.61–1.24)
GFR, Estimated: >60 mL/min (ref >60)
Glucose, Bld: 90 mg/dL (ref 70–99)
Potassium: 3.9 mmol/L (ref 3.5–5.1)
Sodium: 130 mmol/L — ABNORMAL LOW (ref 135–145)
Total Bilirubin: 0.4 mg/dL (ref 0.3–1.2)
Total Protein: 6 g/dL — ABNORMAL LOW (ref 6.5–8.1)

## 2023-05-02 LAB — MAGNESIUM: Magnesium: 2.2 mg/dL (ref 1.7–2.4)

## 2023-05-02 NOTE — TOC Initial Note (Addendum)
Transition of Care [facility]) - Initial/Assessment Note    Patient Details  Name: Kenneth Lester MRN: [id number] Date of Birth: Feb 09, 1956  [facility] CM/SW Contact:    Villa Herb, LCSWA Phone Number: 05/02/2023, 11:22 AM  Clinical Narrative:                 CSW updated that PT is recommending SNF for pt at D/C. CSW spoke with pt who states that he is interested in this and would prefer to stay in [place] if possible. CSW completed Fl2 and sent referral out to local facilities. TOC to follow.   Addendum 12: CSW updated pt that [facility] made bed offer. Pt states he definitely wants to stay in [place] so he would like to accept this bed offer. TOC to follow.   Expected Discharge Plan: Skilled Nursing Facility Barriers to Discharge: Continued Medical Work up   Patient Goals and CMS Choice Patient states their goals for this hospitalization and ongoing recovery are:: go to SNF CMS Medicare.gov Compare Post Acute Care list provided to:: Patient Choice offered to / list presented to : Patient [place] ownership interest in [facility].provided to:: Patient    Expected Discharge Plan and Services In-house Referral: Clinical Social Work Discharge Planning Services: CM Consult Post Acute Care Choice: Skilled Nursing Facility Living arrangements for the past 2 months: Single Family Home                                      Prior Living Arrangements/Services Living arrangements for the past 2 months: Single Family Home Lives with:: Spouse Patient language and need for interpreter reviewed:: Yes Do you feel safe going back to the place where you live?: Yes      Need for Family Participation in Patient Care: Yes (Comment) Care giver support system in place?: Yes (comment)   Criminal Activity/Legal Involvement Pertinent to Current Situation/Hospitalization: No - Comment as needed  Activities of Daily Living      Permission  Sought/Granted                  Emotional Assessment Appearance:: Appears stated age Attitude/Demeanor/Rapport: Engaged Affect (typically observed): Accepting Orientation: : Oriented to Self, Oriented to Place, Oriented to  Time, Oriented to Situation Alcohol / Substance Use: Not Applicable Psych Involvement: No (comment)  Admission diagnosis:  Acute hyponatremia [E87.1] Hyponatremia [E87.1] Patient Active Problem List   Diagnosis Date Noted   Acute hyponatremia 05/01/2023   Tobacco use disorder 05/01/2023   Hypokalemia 05/01/2023   Essential hypertension 05/01/2023   Dizziness 05/01/2023   History of alcohol abuse 05/01/2023   Hyponatremia 05/01/2023   Colon cancer screening 12/31/2018   Clavicle fracture 01/15/2014   Closed fracture of acromial end of clavicle 12/22/2013   PCP:  Carylon Perches, MD Pharmacy:   [facility] [phone] - [place], [place] - [address] AT [facility] [address] [place] [address] Phone: [phone] Fax: [phone]     Social Determinants of Health (SDOH) Social History: SDOH Screenings   Food Insecurity: No Food Insecurity (05/01/2023)  Housing: Low Risk  (05/01/2023)  Transportation Needs: No Transportation Needs (05/01/2023)  Utilities: Not At Risk (05/01/2023)  Tobacco Use: High Risk (04/30/2023)   SDOH Interventions:     Readmission Risk Interventions     No data to display

## 2023-05-02 NOTE — Evaluation (Signed)
Physical Therapy Evaluation Patient Details Name: Kenneth Lester MRN: [id number] DOB: 06-30-1956 Today's Date: 05/02/2023  History of Present Illness  Kenneth Lester is a 67 y.o. male with medical history significant of alcohol abuse, hypertension, vertigo, and more presents ED with a chief complaint of dizziness.  Unfortunately at the time of my exam, patient reports that he cannot answer any more questions.  He reports he has been at the hospital since noon on the eighth and has not gotten any sleep yet.  From chart review appears the patient came in for feeling unsteady on his feet.  The symptoms have been worsening over the last several days.  Patient ambulates with a cane at baseline.  Wife was at bedside and reported the patient had had a fall with concussion a year ago and has been unsteady since.  Patient denies any pain.  Patient denied focal weakness at presentation.  He also reports that he has a good appetite, and appropriate p.o. liquid intake.  At that time patient had denied headaches, nausea, vomiting, chest pain, palpitations, shortness of breath.  Again this history is all taken from chart review.   Clinical Impression  Patient demonstrates fair/good return for bed mobility, slow labored movement during transfers, unable to transfer using SPC due to poor standing balance, required use of RW for safety, but limited to walking in room due to c/o fatigue and generalized weakness.  Patient tolerated staying up in chair after therapy.  Patient will benefit from continued skilled physical therapy in hospital and recommended venue below to increase strength, balance, endurance for safe ADLs and gait.        Assistance Recommended at Discharge Set up Supervision/Assistance  If plan is discharge home, recommend the following:  Can travel by private vehicle  A little help with walking and/or transfers;A little help with bathing/dressing/bathroom;Assistance with cooking/housework;Help  with stairs or ramp for entrance   Yes    Equipment Recommendations Rolling walker (2 wheels)  Recommendations for Other Services       Functional Status Assessment Patient has had a recent decline in their functional status and demonstrates the ability to make significant improvements in function in a reasonable and predictable amount of time.     Precautions / Restrictions Precautions Precautions: Fall Restrictions Weight Bearing Restrictions: No      Mobility  Bed Mobility Overal bed mobility: Needs Assistance Bed Mobility: Supine to Sit, Sit to Supine     Supine to sit: Min guard Sit to supine: Min guard   General bed mobility comments: had to use bed rail during bed mobility    Transfers Overall transfer level: Needs assistance Equipment used: Rolling walker (2 wheels) Transfers: Sit to/from Stand, Bed to chair/wheelchair/BSC Sit to Stand: Min guard, Min assist   Step pivot transfers: Min guard, Min assist       General transfer comment: unable to maintain standing balance using his cane, required use of RW for safety    Ambulation/Gait Ambulation/Gait assistance: Min assist Gait Distance (Feet): 15 Feet Assistive device: Rolling walker (2 wheels) Gait Pattern/deviations: Decreased step length - right, Decreased step length - left, Decreased stride length Gait velocity: slow     General Gait Details: limited to a few slow labored steps in room before having to sit due to c/o fatigue and generalized weakness  Stairs            Wheelchair Mobility     Tilt Bed    Modified Rankin (Stroke Patients Only)  Balance Overall balance assessment: Needs assistance Sitting-balance support: Feet supported, No upper extremity supported Sitting balance-Leahy Scale: Fair Sitting balance - Comments: fair/good seated at EOB   Standing balance support: Reliant on assistive device for balance, During functional activity, Bilateral upper extremity  supported Standing balance-Leahy Scale: Fair Standing balance comment: using RW                             Pertinent Vitals/Pain Pain Assessment Pain Assessment: No/denies pain    Home Living Family/patient expects to be discharged to:: Private residence Living Arrangements: Alone Available Help at Discharge: Other (Comment) (States he has no help) Type of Home: House Home Access: Stairs to enter Entrance Stairs-Rails: Right;Left (to wide to reach both) Entrance Stairs-Number of Steps: 3-4   Home Layout: One level Home Equipment: Cane - single point;Shower seat;Grab bars - tub/shower      Prior Function Prior Level of Function : Independent/Modified Independent;Driving             Mobility Comments: household and short distanced community ambulator using [facility] ADLs Comments: Independent     Hand Dominance        Extremity/Trunk Assessment   Upper Extremity Assessment Upper Extremity Assessment: Generalized weakness    Lower Extremity Assessment Lower Extremity Assessment: Generalized weakness    Cervical / Trunk Assessment Cervical / Trunk Assessment: Normal  Communication   Communication: No difficulties  Cognition Arousal/Alertness: Awake/alert Behavior During Therapy: WFL for tasks assessed/performed Overall Cognitive Status: Within Functional Limits for tasks assessed                                          General Comments      Exercises     Assessment/Plan    PT Assessment Patient needs continued PT services  PT Problem List Decreased strength;Decreased activity tolerance;Decreased balance;Decreased mobility       PT Treatment Interventions DME instruction;Gait training;Stair training;Functional mobility training;Therapeutic activities;Therapeutic exercise;Patient/family education;Balance training    PT Goals (Current goals can be found in the Care Plan section)  Acute Rehab PT Goals Patient Stated Goal:  return home PT Goal Formulation: With patient Time For Goal Achievement: 05/09/23 Potential to Achieve Goals: Good    Frequency Min 3X/week     Co-evaluation               AM-PAC PT "6 Clicks" Mobility  Outcome Measure Help needed turning from your back to your side while in a flat bed without using bedrails?: None Help needed moving from lying on your back to sitting on the side of a flat bed without using bedrails?: A Little Help needed moving to and from a bed to a chair (including a wheelchair)?: A Little Help needed standing up from a chair using your arms (e.g., wheelchair or bedside chair)?: A Little Help needed to walk in hospital room?: A Little Help needed climbing 3-5 steps with a railing? : A Lot 6 Click Score: 18    End of Session   Activity Tolerance: Patient tolerated treatment well;Patient limited by fatigue Patient left: in chair;with call bell/phone within reach Nurse Communication: Mobility status PT Visit Diagnosis: Unsteadiness on feet (R26.81);Other abnormalities of gait and mobility (R26.89);Muscle weakness (generalized) (M62.81)    Time: 1610-9604 PT Time Calculation (min) (ACUTE ONLY): 27 min   Charges:   PT Evaluation $PT Eval  Moderate Complexity: 1 Mod PT Treatments $Therapeutic Activity: 23-37 mins PT General Charges $$ ACUTE PT VISIT: 1 Visit         3:05 PM, 05/02/23 Ocie Bob, MPT Physical Therapist with [facility] [phone] office [phone] mobile phone

## 2023-05-02 NOTE — [place] FL2 (Signed)
[place] MEDICAID FL2 LEVEL OF CARE FORM     IDENTIFICATION  Patient Name: Kenneth Lester Birthdate: 1956/10/10 Sex: male Admission Date (Current Location): 04/30/2023  [facility] and [place] Number:  [employer] and Address:  [facility],  [address] [place] [address]      Provider Number: [id number]  Attending Physician Name and Address:  [name], MD  Relative Name and Phone Number:       Current Level of Care: Hospital Recommended Level of Care: Skilled Nursing Facility Prior Approval Number:    Date Approved/Denied:   PASRR Number: [id number]  Discharge Plan: SNF    Current Diagnoses: Patient Active Problem List   Diagnosis Date Noted   Acute hyponatremia 05/01/2023   Tobacco use disorder 05/01/2023   Hypokalemia 05/01/2023   Essential hypertension 05/01/2023   Dizziness 05/01/2023   History of alcohol abuse 05/01/2023   Hyponatremia 05/01/2023   Colon cancer screening 12/31/2018   Clavicle fracture 01/15/2014   Closed fracture of acromial end of clavicle 12/22/2013    Orientation RESPIRATION BLADDER Height & Weight     Self, Time, Situation, Place  Normal Continent Weight: 137 lb 9.1 oz (62.4 kg) Height:  5\' 8"  (172.7 cm)  BEHAVIORAL SYMPTOMS/MOOD NEUROLOGICAL BOWEL NUTRITION STATUS      Continent Diet (Regular)  AMBULATORY STATUS COMMUNICATION OF NEEDS Skin   Extensive Assist Verbally Normal                       Personal Care Assistance Level of Assistance  Bathing, Feeding, Dressing Bathing Assistance: Limited assistance Feeding assistance: Independent Dressing Assistance: Limited assistance     Functional Limitations Info  Sight, Hearing, Speech Sight Info: Adequate Hearing Info: Adequate Speech Info: Adequate    SPECIAL CARE FACTORS FREQUENCY  PT (By licensed PT), OT (By licensed OT)     PT Frequency: 5 times weekly OT Frequency: 5 times weekly            Contractures Contractures  Info: Not present    Additional Factors Info  Code Status, Allergies Code Status Info: FULL Allergies Info: NKA           Current Medications (05/02/2023):  This is the current hospital active medication list Current Facility-Administered Medications  Medication Dose Route Frequency Provider Last Rate Last Admin   0.9 %  sodium chloride infusion   Intravenous Continuous Zierle-Ghosh, Asia B, DO 75 mL/hr at 05/02/23 1009 New Bag at 05/02/23 1009   acetaminophen (TYLENOL) tablet 650 mg  650 mg Oral Q6H PRN Zierle-Ghosh, Asia B, DO       Or   acetaminophen (TYLENOL) suppository 650 mg  650 mg Rectal Q6H PRN Zierle-Ghosh, Asia B, DO       amLODipine (NORVASC) tablet 10 mg  10 mg Oral Daily Zierle-Ghosh, Asia B, DO   10 mg at 05/02/23 0959   folic acid (FOLVITE) tablet 1 mg  1 mg Oral Daily Zierle-Ghosh, Asia B, DO   1 mg at 05/02/23 0959   heparin injection 5,000 Units  5,000 Units Subcutaneous Q8H Zierle-Ghosh, Asia B, DO   5,000 Units at 05/01/23 0408   LORazepam (ATIVAN) tablet 1-4 mg  1-4 mg Oral Q1H PRN Zierle-Ghosh, Asia B, DO       Or   LORazepam (ATIVAN) tablet 1 mg  1 mg Oral Q1H PRN Zierle-Ghosh, Asia B, DO       losartan (COZAAR) tablet 50 mg  50 mg Oral  Daily Zierle-Ghosh, Asia B, DO   50 mg at 05/02/23 0959   multivitamin with minerals tablet 1 tablet  1 tablet Oral Daily Zierle-Ghosh, Asia B, DO   1 tablet at 05/02/23 7253   nicotine (NICODERM CQ - dosed in mg/24 hours) patch 21 mg  21 mg Transdermal Daily Zierle-Ghosh, Asia B, DO   21 mg at 05/01/23 1015   ondansetron (ZOFRAN) tablet 4 mg  4 mg Oral Q6H PRN Zierle-Ghosh, Asia B, DO       Or   ondansetron (ZOFRAN) injection 4 mg  4 mg Intravenous Q6H PRN Zierle-Ghosh, Asia B, DO       oxyCODONE (Oxy IR/ROXICODONE) immediate release tablet 5 mg  5 mg Oral Q4H PRN Zierle-Ghosh, Asia B, DO       thiamine (VITAMIN B1) tablet 100 mg  100 mg Oral Daily Zierle-Ghosh, Asia B, DO   100 mg at 05/02/23 1006   Or   thiamine (VITAMIN B1)  injection 100 mg  100 mg Intravenous Daily Zierle-Ghosh, Asia B, DO         Discharge Medications: Please see discharge summary for a list of discharge medications.  Relevant Imaging Results:  Relevant Lab Results:   Additional Information SSN: [id number] [place]

## 2023-05-02 NOTE — Plan of Care (Signed)
  Problem: Acute Rehab PT Goals(only PT should resolve) Goal: Pt Will Go Supine/Side To Sit Outcome: Progressing Flowsheets (Taken 05/02/2023 1506) Pt will go Supine/Side to Sit: with modified independence Goal: Patient Will Transfer Sit To/From Stand Outcome: Progressing Flowsheets (Taken 05/02/2023 1506) Patient will transfer sit to/from stand: with modified independence Goal: Pt Will Transfer Bed To Chair/Chair To Bed Outcome: Progressing Flowsheets (Taken 05/02/2023 1506) Pt will Transfer Bed to Chair/Chair to Bed:  with supervision  with modified independence Goal: Pt Will Ambulate Outcome: Progressing Flowsheets (Taken 05/02/2023 1506) Pt will Ambulate:  50 feet  with min guard assist  with supervision  with rolling walker   3:07 PM, 05/02/23 Ocie Bob, MPT Physical Therapist with [facility] [phone] office [phone] mobile phone

## 2023-05-02 NOTE — Progress Notes (Signed)
Mobility Specialist Progress Note:   05/02/23 1153  Mobility  Activity Ambulated with assistance in room  Level of Assistance Contact guard assist, steadying assist  Assistive Device Front wheel walker  Distance Ambulated (ft) 30 ft  Range of Motion/Exercises Active;All extremities  Activity Response Tolerated well  Mobility Referral Yes  $Mobility charge 1 Mobility  Mobility Specialist Start Time (ACUTE ONLY) 1130  Mobility Specialist Stop Time (ACUTE ONLY) 1145  Mobility Specialist Time Calculation (min) (ACUTE ONLY) 15 min   Pt received in chair, agreeable to mobility session. Required CGA for safety and RW. Tolerated well, asx throughout. Returned pt to chair, alarm on, call bell in reach, all needs met.   Feliciana Rossetti Mobility Specialist Please contact via Special educational needs teacher or  Rehab office at [phone]

## 2023-05-02 NOTE — Progress Notes (Signed)
PROGRESS NOTE    RANEY KOEPPEN  ZOX:[id number] DOB: 10/17/1956 DOA: 04/30/2023 PCP: Carylon Perches, MD   Brief Narrative:  67 year old male with prior history of EtOH use, in remission since 2023, HTN, vertigo comes into the hospital with dizziness and weakness. This has been going on for the past several days, feeling very unsteady and tells me he has difficulties doing his ADLs, including eating. Patient's wife reported that patient had a fall with a concussion about a year ago and has been unsteady since with more recent progressive ambulatory dysfunction and increased falls. In the ED he was found to be hyponatremic, placed on fluids and admitted to the hospital    Assessment & Plan:   Principal Problem:   Acute hyponatremia Active Problems:   Tobacco use disorder   Hypokalemia   Essential hypertension   Dizziness   History of alcohol abuse   Hyponatremia  Acute on chronic ambulatory dysfunction  Rule out polypharmacy/failure to thrive/orthostatic hypotension - Patient and his wife indicate some baseline ambulatory dysfunction over the past year after following concussion, uses cane at baseline. - More recently over the past week patient's had worsening amatory dysfunction with profound instability and increased falls. No focal neurodeficits or overt complaints to explain patient's increased fall risk/weakness other than mildly depressed hyponatremia as below, rule out polypharmacy, infection, general debility.  Acute on chronic hyponatremia - Sodium 123 at admission, currently 130 - Baseline is somewhat fluctuant, over the past year sodium has ranged from 125-138; notably as low as 131 back 14 years ago so this is certainly not a new diagnosis for the patient - Continue fluids   History of alcohol abuse - Since patient will not participate in exam, I do not know if he is still drinking - CIWA ordered to err on the side of caution, not scoring    Dizziness/lightheadedness -Patient has difficulty describing the sensation that occurs when walking, dizziness versus lightheadedness versus tunnel vision with questionable ear ringing.  Rule out orthostatic hypotension as above. -Complicating ambulatory dysfunction as above - questionably related to hyponatremia but less likely given its chronicity - CT head showed generalized cerebral atrophy with chronic white matter small vessel ischemic changes   Essential hypertension - Continue amlodipine, losartan; low threshold to decrease or discontinue if orthostatics positive   Hypokalemia, mild -Follow repeat labs   Tobacco use disorder - Patient smokes a pack per day - Will need counseling when he is more awake - Nicotine patch ordered   DVT prophylaxis: heparin injection 5,000 Units Start: 05/01/23 0600 SCDs Start: 05/01/23 0235  Code Status:   Code Status: Full Code Family Communication: None present  Status: Inpatient  Dispo: The patient is from: Home              Anticipated d/c is to: SNF              Anticipated d/c date is: 24 to 48 hours              Patient currently is medically stable for discharge to SNF, continued physical therapy and rehydration can continue at their facility.  Consultants:  None  Procedures:  None  Antimicrobials:  None indicated  Subjective: No acute issues or events overnight denies nausea vomiting diarrhea constipation headache fevers chills or chest pain since admission.  Has not yet ambulated today, unclear if he will still have symptoms with walking as he had been.  Objective: Vitals:   05/01/23 2123 05/02/23 0451 05/02/23 8119  05/02/23 1345  BP: (!) 155/78 133/86 138/74 (!) 145/74  Pulse: 80 67 61 65  Resp: 18 18    Temp: 98.1 F (36.7 C) 98.9 F (37.2 C) 97.8 F (36.6 C) 97.7 F (36.5 C)  TempSrc:   Oral Oral  SpO2: 100% 100% 100% 100%  Weight:      Height:        Intake/Output Summary (Last 24 hours) at 05/02/2023  1504 Last data filed at 05/02/2023 0102 Gross per 24 hour  Intake 2057.79 ml  Output 300 ml  Net 1757.79 ml   Filed Weights   04/30/23 1248 05/01/23 0231  Weight: 61.2 kg 62.4 kg    Examination:  General exam: Appears calm and comfortable  Respiratory system: Clear to auscultation. Respiratory effort normal. Cardiovascular system: S1 & S2 heard, RRR. No JVD, murmurs, rubs, gallops or clicks. No pedal edema. Gastrointestinal system: Abdomen is nondistended, soft and nontender. No organomegaly or masses felt. Normal bowel sounds heard. Central nervous system: Alert and oriented. No focal neurological deficits. Extremities: Symmetric 5 x 5 power. Skin: No rashes, lesions or ulcers Psychiatry: Judgement and insight appear normal. Mood & affect appropriate.     Data Reviewed: I have personally reviewed following labs and imaging studies  CBC: Recent Labs  Lab 04/30/23 1258 05/01/23 0439 05/02/23 0437  WBC 6.8 6.8 5.3  NEUTROABS  --  3.8  --   HGB 13.5 12.5* 11.9*  HCT 38.6* 35.3* 34.0*  MCV 88.7 89.1 89.9  PLT 277 248 237   Basic Metabolic Panel: Recent Labs  Lab 04/30/23 1258 05/01/23 0439 05/01/23 0750 05/01/23 1329 05/02/23 0437  NA 123* 127* 128* 127* 130*  K 3.4* 4.2 4.3 4.0 3.9  CL 91* 99 99 101 104  CO2 24 22 23  21* 21*  GLUCOSE 90 88 94 109* 90  BUN 8 8 7* 7* 7*  CREATININE 0.69 0.54* 0.65 0.64 0.58*  CALCIUM 8.9 8.5* 8.6* 8.3* 8.3*  MG  --  2.3  --   --  2.2   GFR: Estimated Creatinine Clearance: 81.3 mL/min (A) (by C-G formula based on SCr of 0.58 mg/dL (L)). Liver Function Tests: Recent Labs  Lab 04/30/23 1646 05/02/23 0437  AST 16 14*  ALT 16 14  ALKPHOS 60 49  BILITOT 0.4 0.4  PROT 7.3 6.0*  ALBUMIN 4.1 3.3*   No results for input(s): "LIPASE", "AMYLASE" in the last 168 hours. No results for input(s): "AMMONIA" in the last 168 hours. Coagulation Profile: No results for input(s): "INR", "PROTIME" in the last 168 hours. Cardiac  Enzymes: No results for input(s): "CKTOTAL", "CKMB", "CKMBINDEX", "TROPONINI" in the last 168 hours. BNP (last 3 results) No results for input(s): "PROBNP" in the last 8760 hours. HbA1C: No results for input(s): "HGBA1C" in the last 72 hours. CBG: No results for input(s): "GLUCAP" in the last 168 hours. Lipid Profile: No results for input(s): "CHOL", "HDL", "LDLCALC", "TRIG", "CHOLHDL", "LDLDIRECT" in the last 72 hours. Thyroid Function Tests: Recent Labs    05/01/23 0439  TSH 2.790   Anemia Panel: No results for input(s): "VITAMINB12", "FOLATE", "FERRITIN", "TIBC", "IRON", "RETICCTPCT" in the last 72 hours. Sepsis Labs: No results for input(s): "PROCALCITON", "LATICACIDVEN" in the last 168 hours.  No results found for this or any previous visit (from the past 240 hour(s)).       Radiology Studies: CT Head Wo Contrast  Result Date: 04/30/2023 CLINICAL DATA:  Worsening unsteady gait. EXAM: CT HEAD WITHOUT CONTRAST TECHNIQUE: Contiguous axial images were  obtained from the base of the skull through the vertex without intravenous contrast. RADIATION DOSE REDUCTION: This exam was performed according to the departmental dose-optimization program which includes automated exposure control, adjustment of the mA and/or kV according to patient size and/or use of iterative reconstruction technique. COMPARISON:  March 27, 2022 FINDINGS: Brain: There is moderate severity cerebral atrophy with widening of the extra-axial spaces and ventricular dilatation. There are areas of decreased attenuation within the white matter tracts of the supratentorial brain, consistent with microvascular disease changes. Vascular: No hyperdense vessel or unexpected calcification. Skull: Normal. Negative for fracture or focal lesion. Sinuses/Orbits: There is mild left ethmoid sinus mucosal thickening. Other: None. IMPRESSION: 1. Generalized cerebral atrophy with chronic white matter small vessel ischemic changes. 2. No acute  intracranial abnormality. 3. Mild left ethmoid sinus disease. Electronically Signed   By: Aram Candela M.D.   On: 04/30/2023 19:47        Scheduled Meds:  amLODipine  10 mg Oral Daily   folic acid  1 mg Oral Daily   heparin  5,000 Units Subcutaneous Q8H   losartan  50 mg Oral Daily   multivitamin with minerals  1 tablet Oral Daily   nicotine  21 mg Transdermal Daily   thiamine  100 mg Oral Daily   Or   thiamine  100 mg Intravenous Daily   Continuous Infusions:  sodium chloride 75 mL/hr at 05/02/23 1009     LOS: 1 day   Time spent:  [name], DO [facility]  If 7PM-7AM, please contact night-coverage [email or web address]  05/02/2023, 3:04 PM

## 2023-05-03 DIAGNOSIS — I10 Essential (primary) hypertension: Secondary | ICD-10-CM | POA: Diagnosis not present

## 2023-05-03 DIAGNOSIS — M6281 Muscle weakness (generalized): Secondary | ICD-10-CM | POA: Diagnosis not present

## 2023-05-03 DIAGNOSIS — E876 Hypokalemia: Secondary | ICD-10-CM | POA: Diagnosis not present

## 2023-05-03 DIAGNOSIS — D649 Anemia, unspecified: Secondary | ICD-10-CM | POA: Diagnosis not present

## 2023-05-03 DIAGNOSIS — F17200 Nicotine dependence, unspecified, uncomplicated: Secondary | ICD-10-CM | POA: Diagnosis not present

## 2023-05-03 DIAGNOSIS — R42 Dizziness and giddiness: Secondary | ICD-10-CM | POA: Diagnosis not present

## 2023-05-03 DIAGNOSIS — R531 Weakness: Secondary | ICD-10-CM | POA: Diagnosis not present

## 2023-05-03 DIAGNOSIS — F1011 Alcohol abuse, in remission: Secondary | ICD-10-CM | POA: Diagnosis not present

## 2023-05-03 DIAGNOSIS — E871 Hypo-osmolality and hyponatremia: Secondary | ICD-10-CM | POA: Diagnosis not present

## 2023-05-03 LAB — PHOSPHORUS: Phosphorus: 3.4 mg/dL (ref 2.5–4.6)

## 2023-05-03 LAB — COMPREHENSIVE METABOLIC PANEL
ALT: 14 U/L (ref 0–44)
AST: 16 U/L (ref 15–41)
Albumin: 3.4 g/dL — ABNORMAL LOW (ref 3.5–5.0)
Alkaline Phosphatase: 49 U/L (ref 38–126)
Anion gap: 5 (ref 5–15)
BUN: 6 mg/dL — ABNORMAL LOW (ref 8–23)
CO2: 22 mmol/L (ref 22–32)
Calcium: 8.4 mg/dL — ABNORMAL LOW (ref 8.9–10.3)
Chloride: 102 mmol/L (ref 98–111)
Creatinine, Ser: 0.51 mg/dL — ABNORMAL LOW (ref 0.61–1.24)
GFR, Estimated: >60 mL/min (ref >60)
Glucose, Bld: 90 mg/dL (ref 70–99)
Potassium: 3.7 mmol/L (ref 3.5–5.1)
Sodium: 129 mmol/L — ABNORMAL LOW (ref 135–145)
Total Bilirubin: 0.6 mg/dL (ref 0.3–1.2)
Total Protein: 6.4 g/dL — ABNORMAL LOW (ref 6.5–8.1)

## 2023-05-03 LAB — MAGNESIUM: Magnesium: 2.1 mg/dL (ref 1.7–2.4)

## 2023-05-03 MED ORDER — SODIUM CHLORIDE 0.9 % IV SOLN: Freq: Once | INTRAVENOUS | Status: AC

## 2023-05-03 MED ORDER — ADULT MULTIVITAMIN W/MINERALS CH: 1.0000 | ORAL_TABLET | Freq: Every day | ORAL | 0 refills | Status: DC

## 2023-05-03 NOTE — Discharge Summary (Signed)
Physician Discharge Summary  Kenneth Lester LKG:[id number] DOB: Jun 29, 1956 DOA: 04/30/2023  PCP: Carylon Perches, MD  Admit date: 04/30/2023 Discharge date: 05/03/2023  Admitted From: Home Disposition:  SNF  Recommendations for Outpatient Follow-up:  Follow up with PCP in 1-2 weeks  Discharge Condition:Stable  CODE STATUS: Full  Diet recommendation:  As tolerated - liberalize for increased PO intake  Brief/Interim Summary: 67 year old male with prior history of EtOH use, in remission since 2023, HTN, vertigo comes into the hospital with dizziness and weakness. This has been going on for the past several days, feeling very unsteady and tells me he has difficulties doing his ADLs, including eating. Patient's wife reported that patient had a fall with a concussion about a year ago and has been unsteady since with more recent progressive ambulatory dysfunction and increased falls. In the ED he was found to be hyponatremic, placed on fluids and admitted to the hospital.  Patient admitted as above with acute on chronic ambulatory dysfunction.  Initially thought to be secondary to acute hyponatremia however given further review patient has had chronic hyponatremia for years.  With IV fluid resuscitation patient's ambulatory dysfunction weakness improved somewhat.  He is not yet back to baseline but feeling quite well.  Ongoing PT required before discharge home.  Ultimately plan for discharge to SNF for ongoing physical therapy with ultimate plan for discharge home.   Discharge Diagnoses:  Principal Problem:   Acute hyponatremia Active Problems:   Tobacco use disorder   Hypokalemia   Essential hypertension   Dizziness   History of alcohol abuse   Hyponatremia  Acute on chronic ambulatory dysfunction  Rule out polypharmacy/failure to thrive/orthostatic hypotension - Patient and his wife indicate some chronic ambulatory dysfunction over the past year after following concussion, uses cane at  baseline. - More recently over the past week patient's had worsening amatory dysfunction with profound instability and increased falls. No focal neurodeficits or overt complaints to explain patient's increased fall risk/weakness other than mildly depressed hyponatremia as below, no overt polypharmacy/infection noted.   Acute on chronic hyponatremia - Sodium 123 at admission, currently stable around 130 - Baseline is somewhat fluctuant, over the past year sodium has ranged from 125-138; notably as low as 131 back 14 years ago so this is certainly not a new diagnosis for the patient - Increase PO intake   History of alcohol abuse - CIWA negative since admit   Dizziness/lightheadedness Orthostatic hypotension ruled out - Patient has difficulty describing the sensation that occurs when walking, dizziness versus lightheadedness versus tunnel vision with questionable ear ringing.  - Questionably related to hyponatremia but less likely given its chronicity - CT head showed generalized cerebral atrophy with chronic white matter small vessel ischemic changes, no acute findings   Essential hypertension - Continue amlodipine, losartan   Hypokalemia, mild - Follow repeat labs   Tobacco use disorder - Patient smokes a pack per day - Counseling at bedside  Discharge Instructions  Discharge Instructions     Discharge patient   Complete by: As directed    Discharge disposition: 06-Home-Health Care Svc   Discharge patient date: 05/03/2023      Allergies as of 05/03/2023       Reactions   Other Other (See Comments)   Patient states he was allergic to something he was given in an IV at the hospital when being treated for pneumonia, but he doesn't remember what the name of the medication was. 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  Medication List     TAKE these medications    amLODipine 10 MG tablet Commonly known as: NORVASC Take 10 mg by mouth daily.    hydrochlorothiazide 25 MG tablet Commonly known as: HYDRODIURIL Take 25 mg by mouth daily.   ibuprofen 600 MG tablet Commonly known as: ADVIL Take 1 tablet (600 mg total) by mouth 4 (four) times daily. What changed:  when to take this reasons to take this   losartan 50 MG tablet Commonly known as: COZAAR Take 1 tablet by mouth daily.   multivitamin with minerals Tabs tablet Take 1 tablet by mouth daily. Start taking on: May 04, 2023        Contact information for after-discharge care     Destination     [facility] Preferred SNF .   Service: Skilled Nursing Contact information: [address] [place] [address] [phone]                    Allergies  Allergen Reactions   Other Other (See Comments)    Patient states he was allergic to something he was given in an IV at the hospital when being treated for pneumonia, but he doesn't remember what the name of the medication was. 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    Consultations: None   Procedures/Studies: CT Head Wo Contrast  Result Date: 04/30/2023 CLINICAL DATA:  Worsening unsteady gait. EXAM: CT HEAD WITHOUT CONTRAST TECHNIQUE: Contiguous axial images were obtained from the base of the skull through the vertex without intravenous contrast. RADIATION DOSE REDUCTION: This exam was performed according to the departmental dose-optimization program which includes automated exposure control, adjustment of the mA and/or kV according to patient size and/or use of iterative reconstruction technique. COMPARISON:  March 27, 2022 FINDINGS: Brain: There is moderate severity cerebral atrophy with widening of the extra-axial spaces and ventricular dilatation. There are areas of decreased attenuation within the white matter tracts of the supratentorial brain, consistent with microvascular disease changes. Vascular: No hyperdense vessel or unexpected  calcification. Skull: Normal. Negative for fracture or focal lesion. Sinuses/Orbits: There is mild left ethmoid sinus mucosal thickening. Other: None. IMPRESSION: 1. Generalized cerebral atrophy with chronic white matter small vessel ischemic changes. 2. No acute intracranial abnormality. 3. Mild left ethmoid sinus disease. Electronically Signed   By: Aram Candela M.D.   On: 04/30/2023 19:47     Subjective: No acute issues/events overnigh   Discharge Exam: Vitals:   05/03/23 0442 05/03/23 0837  BP: 136/68 (!) 144/86  Pulse: (!) 58 64  Resp: 20   Temp: 97.9 F (36.6 C) 97.9 F (36.6 C)  SpO2: 100% 100%   Vitals:   05/02/23 1345 05/02/23 2114 05/03/23 0442 05/03/23 0837  BP: (!) 145/74 (!) 122/56 136/68 (!) 144/86  Pulse: 65 62 (!) 58 64  Resp:  18 20   Temp: 97.7 F (36.5 C) 97.8 F (36.6 C) 97.9 F (36.6 C) 97.9 F (36.6 C)  TempSrc: Oral   Oral  SpO2: 100% 98% 100% 100%  Weight:      Height:        General: Pt is alert, awake, not in acute distress Cardiovascular: RRR, S1/S2 +, no rubs, no gallops Respiratory: CTA bilaterally, no wheezing, no rhonchi Abdominal: Soft, NT, ND, bowel sounds + Extremities: no edema, no cyanosis    The results of significant diagnostics from this hospitalization (including imaging, microbiology, ancillary and laboratory) are listed below  for reference.     Microbiology: No results found for this or any previous visit (from the past 240 hour(s)).   Labs: BNP (last 3 results) No results for input(s): "BNP" in the last 8760 hours. Basic Metabolic Panel: Recent Labs  Lab 05/01/23 0439 05/01/23 0750 05/01/23 1329 05/02/23 0437 05/03/23 0734  NA 127* 128* 127* 130* 129*  K 4.2 4.3 4.0 3.9 3.7  CL 99 99 101 104 102  CO2 22 23 21* 21* 22  GLUCOSE 88 94 109* 90 90  BUN 8 7* 7* 7* 6*  CREATININE 0.54* 0.65 0.64 0.58* 0.51*  CALCIUM 8.5* 8.6* 8.3* 8.3* 8.4*  MG 2.3  --   --  2.2 2.1  PHOS  --   --   --   --  3.4   Liver  Function Tests: Recent Labs  Lab 04/30/23 1646 05/02/23 0437 05/03/23 0734  AST 16 14* 16  ALT 16 14 14   ALKPHOS 60 49 49  BILITOT 0.4 0.4 0.6  PROT 7.3 6.0* 6.4*  ALBUMIN 4.1 3.3* 3.4*   No results for input(s): "LIPASE", "AMYLASE" in the last 168 hours. No results for input(s): "AMMONIA" in the last 168 hours. CBC: Recent Labs  Lab 04/30/23 1258 05/01/23 0439 05/02/23 0437  WBC 6.8 6.8 5.3  NEUTROABS  --  3.8  --   HGB 13.5 12.5* 11.9*  HCT 38.6* 35.3* 34.0*  MCV 88.7 89.1 89.9  PLT 277 248 237   Thyroid function studies Recent Labs    05/01/23 0439  TSH 2.790    Urinalysis    Component Value Date/Time   COLORURINE YELLOW 04/30/2023 1649   APPEARANCEUR CLEAR 04/30/2023 1649   LABSPEC 1.009 04/30/2023 1649   PHURINE 7.0 04/30/2023 1649   GLUCOSEU NEGATIVE 04/30/2023 1649   HGBUR NEGATIVE 04/30/2023 1649   BILIRUBINUR NEGATIVE 04/30/2023 1649   KETONESUR NEGATIVE 04/30/2023 1649   PROTEINUR NEGATIVE 04/30/2023 1649   NITRITE NEGATIVE 04/30/2023 1649   LEUKOCYTESUR NEGATIVE 04/30/2023 1649   Sepsis Labs Recent Labs  Lab 04/30/23 1258 05/01/23 0439 05/02/23 0437  WBC 6.8 6.8 5.3   Microbiology No results found for this or any previous visit (from the past 240 hour(s)).   Time coordinating discharge: Over 30 minutes  SIGNED:   [name], DO [facility] 05/03/2023, 1:46 PM Pager   If 7PM-7AM, please contact night-coverage [email or web address]

## 2023-05-03 NOTE — Progress Notes (Deleted)
PROGRESS NOTE    Kenneth Lester  ZOX:[id number] DOB: November 22, 1955 DOA: 04/30/2023 PCP: Carylon Perches, MD   Brief Narrative:  67 year old male with prior history of EtOH use, in remission since 2023, HTN, vertigo comes into the hospital with dizziness and weakness. This has been going on for the past several days, feeling very unsteady and tells me he has difficulties doing his ADLs, including eating. Patient's wife reported that patient had a fall with a concussion about a year ago and has been unsteady since with more recent progressive ambulatory dysfunction and increased falls. In the ED he was found to be hyponatremic, placed on fluids and admitted to the hospital    Assessment & Plan:   Principal Problem:   Acute hyponatremia Active Problems:   Tobacco use disorder   Hypokalemia   Essential hypertension   Dizziness   History of alcohol abuse   Hyponatremia  Acute on chronic ambulatory dysfunction  Rule out polypharmacy/failure to thrive/orthostatic hypotension - Patient and his wife indicate some chronic ambulatory dysfunction over the past year after following concussion, uses cane at baseline. - More recently over the past week patient's had worsening amatory dysfunction with profound instability and increased falls. No focal neurodeficits or overt complaints to explain patient's increased fall risk/weakness other than mildly depressed hyponatremia as below, no overt polypharmacy/infection noted.  Acute on chronic hyponatremia - Sodium 123 at admission, currently 130 - Baseline is somewhat fluctuant, over the past year sodium has ranged from 125-138; notably as low as 131 back 14 years ago so this is certainly not a new diagnosis for the patient - Continue fluids - stop overnight as long as PO intake continues   History of alcohol abuse - Since patient will not participate in exam, I do not know if he is still drinking - CIWA ordered to err on the side of caution, not scoring    Dizziness/lightheadedness -Patient has difficulty describing the sensation that occurs when walking, dizziness versus lightheadedness versus tunnel vision with questionable ear ringing.  Rule out orthostatic hypotension as above. -Complicating ambulatory dysfunction as above - questionably related to hyponatremia but less likely given its chronicity - CT head showed generalized cerebral atrophy with chronic white matter small vessel ischemic changes, no acute findings   Essential hypertension - Continue amlodipine, losartan; low threshold to decrease or discontinue if orthostatics positive   Hypokalemia, mild -Follow repeat labs   Tobacco use disorder - Patient smokes a pack per day - Will need counseling when he is more awake - Nicotine patch ordered   DVT prophylaxis: heparin injection 5,000 Units Start: 05/01/23 0600 SCDs Start: 05/01/23 0235  Code Status:   Code Status: Full Code Family Communication: None present  Status: Inpatient  Dispo: The patient is from: Home              Anticipated d/c is to: SNF              Anticipated d/c date is: 24 to 48 hours              Patient currently is medically stable for discharge to SNF, continued physical therapy and rehydration can continue at their facility.  Consultants:  None  Procedures:  None  Antimicrobials:  None indicated  Subjective: No acute issues or events overnight - denies nausea vomiting diarrhea constipation headache fevers chills or chest pain.  Objective: Vitals:   05/02/23 0832 05/02/23 1345 05/02/23 2114 05/03/23 0442  BP: 138/74 (!) 145/74 (!) 122/56 136/68  Pulse: 61 65 62 (!) 58  Resp:   18 20  Temp: 97.8 F (36.6 C) 97.7 F (36.5 C) 97.8 F (36.6 C) 97.9 F (36.6 C)  TempSrc: Oral Oral    SpO2: 100% 100% 98% 100%  Weight:      Height:        Intake/Output Summary (Last 24 hours) at 05/03/2023 0718 Last data filed at 05/03/2023 0500 Gross per 24 hour  Intake 1361.81 ml  Output 125 ml   Net 1236.81 ml   Filed Weights   04/30/23 1248 05/01/23 0231  Weight: 61.2 kg 62.4 kg    Examination:  General:  Pleasantly resting in bed, No acute distress. HEENT:  Normocephalic atraumatic.  Sclerae nonicteric, noninjected.  Extraocular movements intact bilaterally. Neck:  Without mass or deformity.  Trachea is midline. Lungs:  Clear to auscultate bilaterally without rhonchi, wheeze, or rales. Heart:  Regular rate and rhythm.  Without murmurs, rubs, or gallops. Abdomen:  Soft, nontender, nondistended.  Without guarding or rebound. Extremities: Without cyanosis, clubbing, edema, or obvious deformity. Skin:  Warm and dry, no erythema.  Data Reviewed: I have personally reviewed following labs and imaging studies  CBC: Recent Labs  Lab 04/30/23 1258 05/01/23 0439 05/02/23 0437  WBC 6.8 6.8 5.3  NEUTROABS  --  3.8  --   HGB 13.5 12.5* 11.9*  HCT 38.6* 35.3* 34.0*  MCV 88.7 89.1 89.9  PLT 277 248 237   Basic Metabolic Panel: Recent Labs  Lab 04/30/23 1258 05/01/23 0439 05/01/23 0750 05/01/23 1329 05/02/23 0437  NA 123* 127* 128* 127* 130*  K 3.4* 4.2 4.3 4.0 3.9  CL 91* 99 99 101 104  CO2 24 22 23  21* 21*  GLUCOSE 90 88 94 109* 90  BUN 8 8 7* 7* 7*  CREATININE 0.69 0.54* 0.65 0.64 0.58*  CALCIUM 8.9 8.5* 8.6* 8.3* 8.3*  MG  --  2.3  --   --  2.2   GFR: Estimated Creatinine Clearance: 81.3 mL/min (A) (by C-G formula based on SCr of 0.58 mg/dL (L)).  Liver Function Tests: Recent Labs  Lab 04/30/23 1646 05/02/23 0437  AST 16 14*  ALT 16 14  ALKPHOS 60 49  BILITOT 0.4 0.4  PROT 7.3 6.0*  ALBUMIN 4.1 3.3*   Thyroid Function Tests: Recent Labs    05/01/23 0439  TSH 2.790    No results found for this or any previous visit (from the past 240 hour(s)).   Radiology Studies: No results found.  Scheduled Meds:  amLODipine  10 mg Oral Daily   folic acid  1 mg Oral Daily   heparin  5,000 Units Subcutaneous Q8H   losartan  50 mg Oral Daily    multivitamin with minerals  1 tablet Oral Daily   nicotine  21 mg Transdermal Daily   thiamine  100 mg Oral Daily   Or   thiamine  100 mg Intravenous Daily   Continuous Infusions:  sodium chloride 75 mL/hr at 05/02/23 2125     LOS: 2 days   Time spent:  [name], DO [facility]  If 7PM-7AM, please contact night-coverage [email or web address]  05/03/2023, 7:18 AM

## 2023-05-03 NOTE — TOC Transition Note (Signed)
Transition of Care [facility]) - CM/SW Discharge Note   Patient Details  Name: Kenneth Lester MRN: [id number] Date of Birth: 13-Aug-1956  [facility]) CM/SW Contact:  Villa Herb, LCSWA Phone Number: 05/03/2023, 1:57 PM   Clinical Narrative:    CSW confirmed pts insurance Berkley Harvey has been approved for SNF. CSW spoke to [place] with [facility] who confirms they can accept today. MD completed D/C. CSW provided RN with room and report numbers. CSW called and set up [facility] transport for pt. Number listed in chart for pts wife is no in service, no other numbers listed. CSW unable to provide pts wife update due to this. TOC signing off.   Final next level of care: Skilled Nursing Facility Barriers to Discharge: Barriers Resolved   Patient Goals and CMS Choice CMS Medicare.gov Compare Post Acute Care list provided to:: Patient Choice offered to / list presented to : Patient  Discharge Placement                  Patient to be transferred to facility by: [facility] Name of family member notified: Wife Patient and family notified of of transfer: 05/03/23  Discharge Plan and Services Additional resources added to the After Visit Summary for   In-house Referral: Clinical Social Work Discharge Planning Services: CM Consult Post Acute Care Choice: Skilled Nursing Facility                               Social Determinants of Health (SDOH) Interventions SDOH Screenings   Food Insecurity: No Food Insecurity (05/01/2023)  Housing: Low Risk  (05/01/2023)  Transportation Needs: No Transportation Needs (05/01/2023)  Utilities: Not At Risk (05/01/2023)  Tobacco Use: High Risk (04/30/2023)     Readmission Risk Interventions     No data to display

## 2023-05-03 NOTE — Progress Notes (Signed)
Patient discharged to [facility] facility, report called and given to Gean Quint RN. Transported by [facility] to awaiting facility. Spoke with wife and she is to meet the patient there.IV discontinued, catheter intact.

## 2023-05-03 NOTE — Care Management Important Message (Signed)
Important Message  Patient Details  Name: LEAMON PALAU MRN: [id number] Date of Birth: 1956-07-29   Medicare Important Message Given:  Yes     Corey Harold 05/03/2023, 2:56 PM

## 2023-05-03 NOTE — Progress Notes (Signed)
Pt educated on purpose of Heparin and SCD use and has decided to start receiving Heparin. Pt aware of increased risks for blood clots with little to no movement. Pt encouraged to ambulate in room with staff as much as he can and encouraged to wear SCD's also.

## 2023-05-04 DIAGNOSIS — E871 Hypo-osmolality and hyponatremia: Secondary | ICD-10-CM | POA: Diagnosis not present

## 2023-05-04 DIAGNOSIS — R531 Weakness: Secondary | ICD-10-CM | POA: Diagnosis not present

## 2023-05-04 DIAGNOSIS — R42 Dizziness and giddiness: Secondary | ICD-10-CM | POA: Diagnosis not present

## 2023-05-04 DIAGNOSIS — F17200 Nicotine dependence, unspecified, uncomplicated: Secondary | ICD-10-CM | POA: Diagnosis not present

## 2023-05-04 DIAGNOSIS — I10 Essential (primary) hypertension: Secondary | ICD-10-CM | POA: Diagnosis not present

## 2023-05-04 DIAGNOSIS — E876 Hypokalemia: Secondary | ICD-10-CM | POA: Diagnosis not present

## 2023-05-07 DIAGNOSIS — E871 Hypo-osmolality and hyponatremia: Secondary | ICD-10-CM | POA: Diagnosis not present

## 2023-05-07 DIAGNOSIS — R531 Weakness: Secondary | ICD-10-CM | POA: Diagnosis not present

## 2023-05-07 DIAGNOSIS — I10 Essential (primary) hypertension: Secondary | ICD-10-CM | POA: Diagnosis not present

## 2023-05-09 DIAGNOSIS — R531 Weakness: Secondary | ICD-10-CM | POA: Diagnosis not present

## 2023-05-09 DIAGNOSIS — I10 Essential (primary) hypertension: Secondary | ICD-10-CM | POA: Diagnosis not present

## 2023-05-09 DIAGNOSIS — E871 Hypo-osmolality and hyponatremia: Secondary | ICD-10-CM | POA: Diagnosis not present

## 2023-05-11 DIAGNOSIS — E871 Hypo-osmolality and hyponatremia: Secondary | ICD-10-CM | POA: Diagnosis not present

## 2023-05-11 DIAGNOSIS — I10 Essential (primary) hypertension: Secondary | ICD-10-CM | POA: Diagnosis not present

## 2023-05-11 DIAGNOSIS — R42 Dizziness and giddiness: Secondary | ICD-10-CM | POA: Diagnosis not present

## 2023-05-11 DIAGNOSIS — D649 Anemia, unspecified: Secondary | ICD-10-CM | POA: Diagnosis not present

## 2023-05-11 DIAGNOSIS — E876 Hypokalemia: Secondary | ICD-10-CM | POA: Diagnosis not present

## 2023-05-11 DIAGNOSIS — R531 Weakness: Secondary | ICD-10-CM | POA: Diagnosis not present

## 2023-05-14 DIAGNOSIS — R531 Weakness: Secondary | ICD-10-CM | POA: Diagnosis not present

## 2023-05-14 DIAGNOSIS — E876 Hypokalemia: Secondary | ICD-10-CM | POA: Diagnosis not present

## 2023-05-14 DIAGNOSIS — E871 Hypo-osmolality and hyponatremia: Secondary | ICD-10-CM | POA: Diagnosis not present

## 2023-05-14 DIAGNOSIS — D649 Anemia, unspecified: Secondary | ICD-10-CM | POA: Diagnosis not present

## 2023-05-14 DIAGNOSIS — I10 Essential (primary) hypertension: Secondary | ICD-10-CM | POA: Diagnosis not present

## 2023-05-16 DIAGNOSIS — E876 Hypokalemia: Secondary | ICD-10-CM | POA: Diagnosis not present

## 2023-05-16 DIAGNOSIS — D649 Anemia, unspecified: Secondary | ICD-10-CM | POA: Diagnosis not present

## 2023-05-16 DIAGNOSIS — E871 Hypo-osmolality and hyponatremia: Secondary | ICD-10-CM | POA: Diagnosis not present

## 2023-05-16 DIAGNOSIS — I10 Essential (primary) hypertension: Secondary | ICD-10-CM | POA: Diagnosis not present

## 2023-05-16 DIAGNOSIS — R531 Weakness: Secondary | ICD-10-CM | POA: Diagnosis not present

## 2023-05-19 DIAGNOSIS — F17210 Nicotine dependence, cigarettes, uncomplicated: Secondary | ICD-10-CM | POA: Diagnosis not present

## 2023-05-19 DIAGNOSIS — F1011 Alcohol abuse, in remission: Secondary | ICD-10-CM | POA: Diagnosis not present

## 2023-05-19 DIAGNOSIS — G319 Degenerative disease of nervous system, unspecified: Secondary | ICD-10-CM | POA: Diagnosis not present

## 2023-05-19 DIAGNOSIS — Z556 Problems related to health literacy: Secondary | ICD-10-CM | POA: Diagnosis not present

## 2023-05-19 DIAGNOSIS — I10 Essential (primary) hypertension: Secondary | ICD-10-CM | POA: Diagnosis not present

## 2023-05-19 DIAGNOSIS — D649 Anemia, unspecified: Secondary | ICD-10-CM | POA: Diagnosis not present

## 2023-05-19 DIAGNOSIS — E871 Hypo-osmolality and hyponatremia: Secondary | ICD-10-CM | POA: Diagnosis not present

## 2023-05-19 DIAGNOSIS — E876 Hypokalemia: Secondary | ICD-10-CM | POA: Diagnosis not present

## 2023-05-22 ENCOUNTER — Encounter (HOSPITAL_COMMUNITY): Payer: Self-pay | Admitting: *Deleted

## 2023-05-22 ENCOUNTER — Other Ambulatory Visit: Payer: Self-pay

## 2023-05-22 ENCOUNTER — Emergency Department (HOSPITAL_COMMUNITY): Payer: Medicare PPO

## 2023-05-22 ENCOUNTER — Emergency Department (HOSPITAL_COMMUNITY)
Admission: EM | Admit: 2023-05-22 | Discharge: 2023-05-22 | Disposition: A | Discharge status: Home / Self Care | Payer: Medicare PPO | Attending: Emergency Medicine | Admitting: Emergency Medicine

## 2023-05-22 DIAGNOSIS — I10 Essential (primary) hypertension: Secondary | ICD-10-CM | POA: Diagnosis not present

## 2023-05-22 DIAGNOSIS — R29898 Other symptoms and signs involving the musculoskeletal system: Secondary | ICD-10-CM

## 2023-05-22 DIAGNOSIS — Z79899 Other long term (current) drug therapy: Secondary | ICD-10-CM | POA: Insufficient documentation

## 2023-05-22 DIAGNOSIS — E871 Hypo-osmolality and hyponatremia: Secondary | ICD-10-CM | POA: Diagnosis not present

## 2023-05-22 DIAGNOSIS — R0689 Other abnormalities of breathing: Secondary | ICD-10-CM | POA: Diagnosis not present

## 2023-05-22 DIAGNOSIS — G459 Transient cerebral ischemic attack, unspecified: Secondary | ICD-10-CM | POA: Diagnosis not present

## 2023-05-22 DIAGNOSIS — M6281 Muscle weakness (generalized): Secondary | ICD-10-CM | POA: Diagnosis not present

## 2023-05-22 DIAGNOSIS — R531 Weakness: Secondary | ICD-10-CM | POA: Diagnosis not present

## 2023-05-22 LAB — URINALYSIS, ROUTINE W REFLEX MICROSCOPIC
Bilirubin Urine: NEGATIVE
Glucose, UA: NEGATIVE mg/dL
Hgb urine dipstick: NEGATIVE
Ketones, ur: NEGATIVE mg/dL
Leukocytes,Ua: NEGATIVE
Nitrite: NEGATIVE
Protein, ur: NEGATIVE mg/dL
Specific Gravity, Urine: 1.004 — ABNORMAL LOW (ref 1.005–1.030)
pH: 7 (ref 5.0–8.0)

## 2023-05-22 LAB — CBC WITH DIFFERENTIAL/PLATELET
Abs Immature Granulocytes: 0.02 10*3/uL (ref 0.00–0.07)
Basophils Absolute: 0 10*3/uL (ref 0.0–0.1)
Basophils Relative: 0 %
Eosinophils Absolute: 0.8 10*3/uL — ABNORMAL HIGH (ref 0.0–0.5)
Eosinophils Relative: 11 %
HCT: 39 % (ref 39.0–52.0)
Hemoglobin: 13.7 g/dL (ref 13.0–17.0)
Immature Granulocytes: 0 %
Lymphocytes Relative: 21 %
Lymphs Abs: 1.6 10*3/uL (ref 0.7–4.0)
MCH: 31.7 pg (ref 26.0–34.0)
MCHC: 35.1 g/dL (ref 30.0–36.0)
MCV: 90.3 fL (ref 80.0–100.0)
Monocytes Absolute: 0.5 10*3/uL (ref 0.1–1.0)
Monocytes Relative: 6 %
Neutro Abs: 4.6 10*3/uL (ref 1.7–7.7)
Neutrophils Relative %: 62 %
Platelets: 293 10*3/uL (ref 150–400)
RBC: 4.32 MIL/uL (ref 4.22–5.81)
RDW: 13.2 % (ref 11.5–15.5)
WBC: 7.6 10*3/uL (ref 4.0–10.5)
nRBC: 0 % (ref 0.0–0.2)

## 2023-05-22 LAB — BASIC METABOLIC PANEL
Anion gap: 5 (ref 5–15)
BUN: 7 mg/dL — ABNORMAL LOW (ref 8–23)
CO2: 26 mmol/L (ref 22–32)
Calcium: 9.4 mg/dL (ref 8.9–10.3)
Chloride: 97 mmol/L — ABNORMAL LOW (ref 98–111)
Creatinine, Ser: 0.56 mg/dL — ABNORMAL LOW (ref 0.61–1.24)
GFR, Estimated: >60 mL/min (ref >60)
Glucose, Bld: 103 mg/dL — ABNORMAL HIGH (ref 70–99)
Potassium: 3.7 mmol/L (ref 3.5–5.1)
Sodium: 128 mmol/L — ABNORMAL LOW (ref 135–145)

## 2023-05-22 LAB — HEPATIC FUNCTION PANEL
ALT: 20 U/L (ref 0–44)
AST: 17 U/L (ref 15–41)
Albumin: 4.4 g/dL (ref 3.5–5.0)
Alkaline Phosphatase: 62 U/L (ref 38–126)
Bilirubin, Direct: <0.1 mg/dL (ref 0.0–0.2)
Total Bilirubin: 0.6 mg/dL (ref 0.3–1.2)
Total Protein: 7.9 g/dL (ref 6.5–8.1)

## 2023-05-22 LAB — ETHANOL: Alcohol, Ethyl (B): <10 mg/dL (ref <10)

## 2023-05-22 MED ORDER — GADOBUTROL 1 MMOL/ML IV SOLN
6.0000 mL | Freq: Once | INTRAVENOUS | Status: AC | PRN
  Administered 2023-05-22: 6 mL via INTRAVENOUS

## 2023-05-22 NOTE — ED Provider Triage Note (Signed)
Emergency Medicine Provider Triage Evaluation Note  Kenneth Lester , a 67 y.o. male, with prior history of alcohol use, in remission since 2023.  Was evaluated in triage.  Pt complains of weakness of both legs with difficulty walking or standing.  Seen here on 04/30/2023 for increased weakness and tremors and difficulty standing.  Found to be hyponatremic sodium was 123 he was admitted to the hospital with discharge to SNF and returned home last week.  Felt very weak in his legs today and was afraid to stand or attempt to walk as he was afraid he may fall.  He denies any pain.  Symptoms began approximately 2 hours prior to ER arrival.  Review of Systems  Positive: Lower extremity weakness Negative: Headache, unilateral weakness, dizziness  Physical Exam  BP (!) 151/81 (BP Location: Right Arm)   Pulse 78   Temp 98.5 F (36.9 C) (Oral)   Resp 18   Ht 5\' 8"  (1.727 m)   Wt 59 kg   SpO2 99%   BMI 19.77 kg/m  Gen:   Awake, no distress   Resp:  Normal effort  MSK:   Moves extremities without difficulty  Other:    Medical Decision Making  Medically screening exam initiated at 11:48 AM.  Appropriate orders placed.  ABDURAHMAN FITZER was informed that the remainder of the evaluation will be completed by another provider, this initial triage assessment does not replace that evaluation, and the importance of remaining in the ED until their evaluation is complete.     Pauline Aus, PA-C 05/22/23 1155

## 2023-05-22 NOTE — ED Triage Notes (Signed)
Pt BIB [facility] for "wobbly legs", denies any weakness or dizziness, denies any pain. Pt recent admit for hyponatremia on 7/8.

## 2023-05-22 NOTE — Discharge Instructions (Signed)
Use the walker instead of your cane for standing and walking.  Please call the neurology group listed to arrange follow-up appointment.  Return to the emergency department for any new or worsening symptoms.

## 2023-05-23 NOTE — ED Provider Notes (Signed)
[place] EMERGENCY DEPARTMENT AT [facility] Provider Note   CSN: [id number] Arrival date & time: 05/22/23  1111     History  Chief Complaint  Patient presents with   Weakness    Kenneth Lester is a 67 y.o. male.   Weakness Associated symptoms: no abdominal pain, no arthralgias, no chest pain, no dizziness, no dysuria, no fever, no headaches, no nausea, no shortness of breath and no vomiting         Kenneth Lester is a 67 y.o. male with past medical history of hypertension, prior alcohol use with remission 1 year ago, who presents to the Emergency Department complaining of bilateral lower extremity weakness.  He describes feeling like "my legs are wobbly."  He walks with cane at baseline and states that he is afraid to stand or try to walk as he feels he may fall because his legs feel weak.  He was seen here on 04/30/2023 for similar symptoms, was admitted for hyponatremia.  Continue to have low sodium during his hospital stay.  He was discharged to SNF facility for rehab.  Recently discharged back home.  He denies any decreased appetite, vomiting, diarrhea, fever or chills.  No chest pain or shortness of breath headache or dizziness.  Also denies any pain of his legs or numbness.    Home Medications Prior to Admission medications   Medication Sig Start Date End Date Taking? Authorizing Provider  amLODipine (NORVASC) 10 MG tablet Take 10 mg by mouth daily.    [provider]  hydrochlorothiazide (HYDRODIURIL) 25 MG tablet Take 25 mg by mouth daily.    [provider]  ibuprofen (ADVIL) 600 MG tablet Take 1 tablet (600 mg total) by mouth 4 (four) times daily. Patient taking differently: Take 600 mg by mouth every 6 (six) hours as needed. 09/02/19   Ivery Quale, PA-C  losartan (COZAAR) 50 MG tablet Take 1 tablet by mouth daily. 10/24/18   [provider]  Multiple Vitamin (MULTIVITAMIN WITH MINERALS) TABS tablet Take 1 tablet by mouth  daily. 05/04/23   [name], MD      Allergies    Other    Review of Systems   Review of Systems  Constitutional:  Negative for appetite change, chills and fever.  Eyes:  Negative for visual disturbance.  Respiratory:  Negative for shortness of breath.   Cardiovascular:  Negative for chest pain and leg swelling.  Gastrointestinal:  Negative for abdominal pain, nausea and vomiting.  Genitourinary:  Negative for dysuria and flank pain.  Musculoskeletal:  Positive for gait problem. Negative for arthralgias.  Skin:  Negative for rash and wound.  Neurological:  Positive for weakness and light-headedness. Negative for dizziness, syncope, facial asymmetry, speech difficulty and headaches.    Physical Exam Updated Vital Signs BP (!) 142/83   Pulse 72   Temp 98 F (36.7 C)   Resp 16   Ht 5\' 8"  (1.727 m)   Wt 59 kg   SpO2 98%   BMI 19.77 kg/m  Physical Exam Vitals and nursing note reviewed.  Constitutional:      General: He is not in acute distress.    Appearance: Normal appearance. He is ill-appearing. He is not toxic-appearing.  Cardiovascular:     Rate and Rhythm: Normal rate and regular rhythm.     Pulses: Normal pulses.  Pulmonary:     Effort: Pulmonary effort is normal. No respiratory distress.  Abdominal:     Palpations: Abdomen is  soft.     Tenderness: There is no abdominal tenderness.  Musculoskeletal:        General: No swelling or tenderness. Normal range of motion.     Cervical back: Normal range of motion. No rigidity or tenderness.     Right lower leg: No edema.     Left lower leg: No edema.  Skin:    General: Skin is warm.     Capillary Refill: Capillary refill takes less than 2 seconds.     Findings: No erythema or lesion.  Neurological:     General: No focal deficit present.     Mental Status: He is alert.     GCS: GCS eye subscore is 4. GCS verbal subscore is 5. GCS motor subscore is 6.     Sensory: Sensation is intact. No sensory deficit.      Motor: Motor function is intact. No weakness or abnormal muscle tone.     Coordination: Coordination is intact.     Comments: CN II through XII intact.  Speech clear.  No pronator drift.  Patient has 5 out of 5 motor strength of the bilateral upper and lower extremities.     ED Results / Procedures / Treatments   Labs (all labs ordered are listed, but only abnormal results are displayed) Labs Reviewed  CBC WITH DIFFERENTIAL/PLATELET - Abnormal; Notable for the following components:      Result Value   Eosinophils Absolute 0.8 (*)    All other components within normal limits  BASIC METABOLIC PANEL - Abnormal; Notable for the following components:   Sodium 128 (*)    Chloride 97 (*)    Glucose, Bld 103 (*)    BUN 7 (*)    Creatinine, Ser 0.56 (*)    All other components within normal limits  URINALYSIS, ROUTINE W REFLEX MICROSCOPIC - Abnormal; Notable for the following components:   Color, Urine STRAW (*)    Specific Gravity, Urine 1.004 (*)    All other components within normal limits  ETHANOL  HEPATIC FUNCTION PANEL    EKG EKG Interpretation Date/Time:  Tuesday May 22 2023 11:26:39 EDT Ventricular Rate:  75 PR Interval:  148 QRS Duration:  72 QT Interval:  364 QTC Calculation: 406 R Axis:   68  Text Interpretation: Normal sinus rhythm Septal infarct , age undetermined Abnormal ECG Confirmed by Gerhard Munch [phone]) on 05/22/2023 4:24:23 PM  Radiology MR Brain W and Wo Contrast  Result Date: 05/22/2023 CLINICAL DATA:  Transient ischemic attack (TIA) LE weakness EXAM: MRI HEAD WITHOUT AND WITH CONTRAST TECHNIQUE: Multiplanar, multiecho pulse sequences of the brain and surrounding structures were obtained without and with intravenous contrast. CONTRAST:  6mL GADAVIST GADOBUTROL 1 MMOL/ML IV SOLN COMPARISON:  CT head 03/27/22 FINDINGS: Brain: Negative for an acute infarct. No hemorrhage. No hydrocephalus. No extra-axial fluid collection. There is extensive periventricular  T2/FLAIR hyperintense signal abnormality that also involves the corpus callosum, brainstem, bilateral basal ganglia and capsular regions. There is no evidence of contrast enhancement. Vascular: Normal flow voids. Skull and upper cervical spine: Normal marrow signal. Sinuses/Orbits: Middle ear or mastoid effusion. Paranasal sinuses are notable for mucosal thickening in the right maxillary sinus. Other: None. IMPRESSION: 1. Negative for an acute infarct. 2. Extensive periventricular T2/FLAIR hyperintense signal abnormality that also involves the corpus callosum, brainstem, bilateral basal ganglia and thalamocapsular regions. Findings are nonspecific, but given history of chronic alcohol use, primary differential considerations include Machiafava-Bignami disease, possibly with a component of Wernicke's encephalopathy. Other nonspecific  toxic-metabolic encephalopathies could also present similarly. Consider further evaluation with a cervical and thoracic spine MRI to assess for spinal cord involvement if there is clinical concern for chronic malnutrition. Electronically Signed   By: Lorenza Cambridge M.D.   On: 05/22/2023 15:57    Procedures Procedures    Medications Ordered in ED Medications  gadobutrol (GADAVIST) 1 MMOL/ML injection 6 mL (6 mLs Intravenous Contrast Given 05/22/23 1447)    ED Course/ Medical Decision Making/ A&P                                 Medical Decision Making Patient here with complaint of weakness of the bilateral lower extremities.  Was recently hospitalized for hyponatremia discharged to SNF facility for rehab.  Returned home recently.  States hhis legs feel wobbly and he is afraid that if he stands or tries to walk that he may fall.  He uses a cane at baseline.  He denies any pain or numbness or swelling of his lower extremities.  No unilateral weakness headache or dizziness.  Lower extremity weakness felt to be secondary to deconditioning but recurrent electrolyte abnormality  possible.  Acute neurologic process also considered as well as infectious process although this is felt less likely.  Amount and/or Complexity of Data Reviewed Labs: ordered.    Details: Labs interpreted by me, no evidence of leukocytosis or anemia, blood alcohol less than 10, LFTs unremarkable, hyponatremia still present but appears to be his baseline, urinalysis without infection Radiology: ordered.    Details: On review of prior records, patient had CT head on previous ER visit, MRI brain ordered today, negative for acute infarct.  Findings show possible component of Wernicke's encephalopathy ECG/medicine tests: ordered.    Details: EKG shows normal sinus rhythm age-indeterminate septal infarct Discussion of management or test interpretation with external provider(s):  I do not appreciate any focal neuro deficit on patient's exam today.  He endorses feeling weak in his bilateral legs.  Remains hyponatremic but this appears baseline for the last several weeks.  MRI brain today without evidence of acute process  Patient also seen by Dr. Jeraldine Loots and care plan discussed.  Patient does appear appropriate for discharge home, I discussed findings with patient and spouse at bedside.  Will have him switch from cane to walker and recommended outpatient follow-up with neurology.  They are agreeable to plan and return precautions were also given.   Risk Prescription drug management.           Final Clinical Impression(s) / ED Diagnoses Final diagnoses:  Weakness of both lower extremities    Rx / DC Orders ED Discharge Orders     None         Pauline Aus, PA-C 05/23/23 1345    Gerhard Munch, MD 05/23/23 1627

## 2023-05-25 DIAGNOSIS — I10 Essential (primary) hypertension: Secondary | ICD-10-CM | POA: Diagnosis not present

## 2023-05-25 DIAGNOSIS — E876 Hypokalemia: Secondary | ICD-10-CM | POA: Diagnosis not present

## 2023-05-25 DIAGNOSIS — F17210 Nicotine dependence, cigarettes, uncomplicated: Secondary | ICD-10-CM | POA: Diagnosis not present

## 2023-05-25 DIAGNOSIS — Z556 Problems related to health literacy: Secondary | ICD-10-CM | POA: Diagnosis not present

## 2023-05-25 DIAGNOSIS — E871 Hypo-osmolality and hyponatremia: Secondary | ICD-10-CM | POA: Diagnosis not present

## 2023-05-25 DIAGNOSIS — G319 Degenerative disease of nervous system, unspecified: Secondary | ICD-10-CM | POA: Diagnosis not present

## 2023-05-25 DIAGNOSIS — F1011 Alcohol abuse, in remission: Secondary | ICD-10-CM | POA: Diagnosis not present

## 2023-05-25 DIAGNOSIS — D649 Anemia, unspecified: Secondary | ICD-10-CM | POA: Diagnosis not present

## 2023-05-28 DIAGNOSIS — D649 Anemia, unspecified: Secondary | ICD-10-CM | POA: Diagnosis not present

## 2023-05-28 DIAGNOSIS — E871 Hypo-osmolality and hyponatremia: Secondary | ICD-10-CM | POA: Diagnosis not present

## 2023-05-28 DIAGNOSIS — I10 Essential (primary) hypertension: Secondary | ICD-10-CM | POA: Diagnosis not present

## 2023-05-28 DIAGNOSIS — F1011 Alcohol abuse, in remission: Secondary | ICD-10-CM | POA: Diagnosis not present

## 2023-05-28 DIAGNOSIS — Z556 Problems related to health literacy: Secondary | ICD-10-CM | POA: Diagnosis not present

## 2023-05-28 DIAGNOSIS — F17210 Nicotine dependence, cigarettes, uncomplicated: Secondary | ICD-10-CM | POA: Diagnosis not present

## 2023-05-28 DIAGNOSIS — E876 Hypokalemia: Secondary | ICD-10-CM | POA: Diagnosis not present

## 2023-05-28 DIAGNOSIS — G319 Degenerative disease of nervous system, unspecified: Secondary | ICD-10-CM | POA: Diagnosis not present

## 2023-05-29 ENCOUNTER — Telehealth: Payer: Self-pay | Admitting: *Deleted

## 2023-05-29 NOTE — Telephone Encounter (Signed)
   Telephone encounter was:  Successful.  05/29/2023 Name: CASE LAMMERS MRN: [id number] DOB: 12-Aug-1956  ARROW JENSEN is a 67 y.o. year old male who is a primary care patient of Carylon Perches, MD . The community resource team was consulted for assistance with Transportation Needs   Care guide performed the following interventions: Patient provided with information about care guide support team and interviewed to confirm resource needs.Patient needing  case management he is very depressed sounding and has multiple needs that should be evaluated and also  Needs equipment I have arranged transportation and moms meals for a few weeks patient is still dizzy after ed visit going to schedule f/u  visit and call [facility] transportation also will provided two weeks moms meals due to long periods alone and still dizzy with no safe mobility cane to use   Follow Up Plan:  No further follow up planned at this time. The patient has been provided with needed resources.  Yehuda Mao Greenauer -[facility], Population Health [phone] [address] [place] , [place] [address] Email : [email or web address]

## 2023-05-29 NOTE — Telephone Encounter (Signed)
Transition Care Management Follow-up Telephone Call Date of discharge and from where: [facility] 05/22/2023 How have you been since you were released from the hospital? Still very wobbly  Any questions or concerns? Yes  Patient needing  case management he is very depressed sounding and has multiple needs that should be evaluated and also  Needs equipment I have arranged transportation and moms meals for a few weeks patient is still dizzy after ed visit going to schedule f/u  visit and call [facility] transportation   Items Reviewed: Did the pt receive and understand the discharge instructions provided? Yes  Medications obtained and verified? Yes  Other? No  Any new allergies since your discharge? No  Dietary orders reviewed? No Do you have support at home? Yes  Wife works home alone a lot needs cane and will provide moms meals for 2 weeks til he can schedule follow up appt with [facility] transportation     Follow up appointments reviewed:  PCP Hospital f/u appt confirmed? No  patient provided transportation for need F/U to ed visit provided [facility] transportation for this patient to call when he has an appt scheduled  Are transportation arrangements needed? Yes  If their condition worsens, is the pt aware to call PCP or go to the Emergency Dept.? Yes Was the patient provided with contact information for the PCP's office or ED? Yes Was to pt encouraged to call back with questions or concerns? Yes   Alois Cliche -[facility] , Population Health [phone] [address] [place] , [place] [address] Email : [email or web address]

## 2023-06-04 DIAGNOSIS — F1011 Alcohol abuse, in remission: Secondary | ICD-10-CM | POA: Diagnosis not present

## 2023-06-04 DIAGNOSIS — G319 Degenerative disease of nervous system, unspecified: Secondary | ICD-10-CM | POA: Diagnosis not present

## 2023-06-04 DIAGNOSIS — F17210 Nicotine dependence, cigarettes, uncomplicated: Secondary | ICD-10-CM | POA: Diagnosis not present

## 2023-06-04 DIAGNOSIS — E871 Hypo-osmolality and hyponatremia: Secondary | ICD-10-CM | POA: Diagnosis not present

## 2023-06-04 DIAGNOSIS — Z556 Problems related to health literacy: Secondary | ICD-10-CM | POA: Diagnosis not present

## 2023-06-04 DIAGNOSIS — I10 Essential (primary) hypertension: Secondary | ICD-10-CM | POA: Diagnosis not present

## 2023-06-04 DIAGNOSIS — E876 Hypokalemia: Secondary | ICD-10-CM | POA: Diagnosis not present

## 2023-06-04 DIAGNOSIS — D649 Anemia, unspecified: Secondary | ICD-10-CM | POA: Diagnosis not present

## 2023-06-05 ENCOUNTER — Encounter (HOSPITAL_COMMUNITY): Payer: Self-pay

## 2023-06-05 ENCOUNTER — Other Ambulatory Visit: Payer: Self-pay

## 2023-06-05 ENCOUNTER — Emergency Department (HOSPITAL_COMMUNITY)
Admission: EM | Admit: 2023-06-05 | Discharge: 2023-06-05 | Disposition: A | Discharge status: Home / Self Care | Payer: Medicare PPO | Attending: Emergency Medicine | Admitting: Emergency Medicine

## 2023-06-05 DIAGNOSIS — R29898 Other symptoms and signs involving the musculoskeletal system: Secondary | ICD-10-CM

## 2023-06-05 DIAGNOSIS — R110 Nausea: Secondary | ICD-10-CM | POA: Insufficient documentation

## 2023-06-05 DIAGNOSIS — E871 Hypo-osmolality and hyponatremia: Secondary | ICD-10-CM | POA: Diagnosis not present

## 2023-06-05 DIAGNOSIS — Z743 Need for continuous supervision: Secondary | ICD-10-CM | POA: Diagnosis not present

## 2023-06-05 DIAGNOSIS — I10 Essential (primary) hypertension: Secondary | ICD-10-CM | POA: Insufficient documentation

## 2023-06-05 DIAGNOSIS — Z79899 Other long term (current) drug therapy: Secondary | ICD-10-CM | POA: Diagnosis not present

## 2023-06-05 DIAGNOSIS — R42 Dizziness and giddiness: Secondary | ICD-10-CM | POA: Diagnosis not present

## 2023-06-05 DIAGNOSIS — R5381 Other malaise: Secondary | ICD-10-CM | POA: Diagnosis not present

## 2023-06-05 DIAGNOSIS — R531 Weakness: Secondary | ICD-10-CM | POA: Insufficient documentation

## 2023-06-05 LAB — BASIC METABOLIC PANEL
Anion gap: 9 (ref 5–15)
BUN: 8 mg/dL (ref 8–23)
CO2: 25 mmol/L (ref 22–32)
Calcium: 9.3 mg/dL (ref 8.9–10.3)
Chloride: 96 mmol/L — ABNORMAL LOW (ref 98–111)
Creatinine, Ser: 0.66 mg/dL (ref 0.61–1.24)
GFR, Estimated: >60 mL/min (ref >60)
Glucose, Bld: 102 mg/dL — ABNORMAL HIGH (ref 70–99)
Potassium: 3.5 mmol/L (ref 3.5–5.1)
Sodium: 130 mmol/L — ABNORMAL LOW (ref 135–145)

## 2023-06-05 LAB — URINALYSIS, ROUTINE W REFLEX MICROSCOPIC
Bilirubin Urine: NEGATIVE
Glucose, UA: NEGATIVE mg/dL
Hgb urine dipstick: NEGATIVE
Ketones, ur: NEGATIVE mg/dL
Leukocytes,Ua: NEGATIVE
Nitrite: NEGATIVE
Protein, ur: NEGATIVE mg/dL
Specific Gravity, Urine: 1.012 (ref 1.005–1.030)
pH: 7 (ref 5.0–8.0)

## 2023-06-05 LAB — CBC WITH DIFFERENTIAL/PLATELET
Abs Immature Granulocytes: 0.02 10*3/uL (ref 0.00–0.07)
Basophils Absolute: 0 10*3/uL (ref 0.0–0.1)
Basophils Relative: 0 %
Eosinophils Absolute: 0.6 10*3/uL — ABNORMAL HIGH (ref 0.0–0.5)
Eosinophils Relative: 9 %
HCT: 41.7 % (ref 39.0–52.0)
Hemoglobin: 14.4 g/dL (ref 13.0–17.0)
Immature Granulocytes: 0 %
Lymphocytes Relative: 22 %
Lymphs Abs: 1.4 10*3/uL (ref 0.7–4.0)
MCH: 31.5 pg (ref 26.0–34.0)
MCHC: 34.5 g/dL (ref 30.0–36.0)
MCV: 91.2 fL (ref 80.0–100.0)
Monocytes Absolute: 0.4 10*3/uL (ref 0.1–1.0)
Monocytes Relative: 7 %
Neutro Abs: 3.9 10*3/uL (ref 1.7–7.7)
Neutrophils Relative %: 62 %
Platelets: 303 10*3/uL (ref 150–400)
RBC: 4.57 MIL/uL (ref 4.22–5.81)
RDW: 13.6 % (ref 11.5–15.5)
WBC: 6.3 10*3/uL (ref 4.0–10.5)
nRBC: 0 % (ref 0.0–0.2)

## 2023-06-05 NOTE — Discharge Instructions (Signed)
You have been arranged to begin physical therapy at home and a home health aide to assist you with your daily activities at home.  Someone from [facility] should be contacting you to make arrangements for this.  Please follow-up with your primary care provider for recheck.

## 2023-06-05 NOTE — ED Notes (Signed)
Patient is requesting food. Explained that a tray was ordered.

## 2023-06-05 NOTE — ED Triage Notes (Addendum)
BIBA from home with c/o "feeling wobbly" Seen 7/30 for same.  [id number] w/ ems A&O x4 on arrival. Uses cane at baseline

## 2023-06-05 NOTE — ED Notes (Signed)
TOC consulted for HH/DME needs. CSW spoke with pt who states he is alone during the day while his wife works. CSW explained that no daily care giving services can be set up by the hospital as these are private pay. CSW explained HH referral and pt states to speak with his wife. CSW spoke with pts wife who is agreeable to [facility] referral and does not have an agency preference. CSW spoke to [place] with [facility] who states they can accept [facility] PT/OT/Aide referral. CSW requested that MD place [facility] orders. TOC signing off.

## 2023-06-06 DIAGNOSIS — G319 Degenerative disease of nervous system, unspecified: Secondary | ICD-10-CM | POA: Diagnosis not present

## 2023-06-06 DIAGNOSIS — I10 Essential (primary) hypertension: Secondary | ICD-10-CM | POA: Diagnosis not present

## 2023-06-06 DIAGNOSIS — F17210 Nicotine dependence, cigarettes, uncomplicated: Secondary | ICD-10-CM | POA: Diagnosis not present

## 2023-06-06 DIAGNOSIS — E876 Hypokalemia: Secondary | ICD-10-CM | POA: Diagnosis not present

## 2023-06-06 DIAGNOSIS — E871 Hypo-osmolality and hyponatremia: Secondary | ICD-10-CM | POA: Diagnosis not present

## 2023-06-06 DIAGNOSIS — Z556 Problems related to health literacy: Secondary | ICD-10-CM | POA: Diagnosis not present

## 2023-06-06 DIAGNOSIS — F1011 Alcohol abuse, in remission: Secondary | ICD-10-CM | POA: Diagnosis not present

## 2023-06-06 DIAGNOSIS — D649 Anemia, unspecified: Secondary | ICD-10-CM | POA: Diagnosis not present

## 2023-06-10 NOTE — ED Provider Notes (Signed)
[place] EMERGENCY DEPARTMENT AT [facility] Provider Note   CSN: [id number] Arrival date & time: 06/05/23  0940     History  Chief Complaint  Patient presents with   Weakness    Kenneth Lester is a 67 y.o. male.   Weakness Associated symptoms: nausea   Associated symptoms: no abdominal pain, no arthralgias, no chest pain, no dizziness, no fever, no headaches, no seizures, no shortness of breath and no vomiting        Kenneth Lester is a 67 y.o. male with past medical history of hypertension, remote alcohol use, who presents to the Emergency Department complaining of "feeling wobbly" when he attempts to stand or walk.  He describes having weakness of both legs.  He ambulates with a cane at baseline.  He was seen here on 05/22/2023 for same.  Had MRI imaging of his brain at that time.  He also states that his wife works during the day and he is left at home by himself and has to get himself back and forth from the chair to the bathroom into the kitchen.  He states that he does not feel safe at home by himself.  He denies any new symptoms.  Home Medications Prior to Admission medications   Medication Sig Start Date End Date Taking? Authorizing Provider  amLODipine (NORVASC) 10 MG tablet Take 10 mg by mouth daily.    [provider]  hydrochlorothiazide (HYDRODIURIL) 25 MG tablet Take 25 mg by mouth daily.    [provider]  ibuprofen (ADVIL) 600 MG tablet Take 1 tablet (600 mg total) by mouth 4 (four) times daily. Patient taking differently: Take 600 mg by mouth every 6 (six) hours as needed. 09/02/19   Ivery Quale, PA-C  losartan (COZAAR) 50 MG tablet Take 1 tablet by mouth daily. 10/24/18   [provider]  Multiple Vitamin (MULTIVITAMIN WITH MINERALS) TABS tablet Take 1 tablet by mouth daily. 05/04/23   [name], MD      Allergies    Other    Review of Systems   Review of Systems  Constitutional:  Negative for fever  and unexpected weight change.  Eyes:  Negative for visual disturbance.  Respiratory:  Negative for shortness of breath.   Cardiovascular:  Negative for chest pain.  Gastrointestinal:  Positive for nausea. Negative for abdominal pain and vomiting.  Genitourinary:  Negative for difficulty urinating.  Musculoskeletal:  Negative for arthralgias and joint swelling.  Neurological:  Positive for weakness (Bilateral lower extremity weakness). Negative for dizziness, seizures, syncope, numbness and headaches.  Psychiatric/Behavioral:  Negative for confusion.     Physical Exam Updated Vital Signs BP 130/78   Pulse 63   Temp 98 F (36.7 C)   Resp 16   Wt 58 kg   SpO2 100%   BMI 19.44 kg/m  Physical Exam Vitals and nursing note reviewed.  Constitutional:      General: He is not in acute distress.    Appearance: He is not toxic-appearing.     Comments: Slightly frail appearing  HENT:     Mouth/Throat:     Mouth: Mucous membranes are moist.     Pharynx: Oropharynx is clear.  Eyes:     Extraocular Movements: Extraocular movements intact.     Conjunctiva/sclera: Conjunctivae normal.     Pupils: Pupils are equal, round, and reactive to light.  Cardiovascular:     Rate and Rhythm: Normal rate and regular rhythm.  Pulses: Normal pulses.  Pulmonary:     Effort: Pulmonary effort is normal.  Abdominal:     Palpations: Abdomen is soft.     Tenderness: There is no abdominal tenderness.  Musculoskeletal:        General: No tenderness. Normal range of motion.     Cervical back: Normal range of motion.     Right lower leg: No edema.     Left lower leg: No edema.  Skin:    General: Skin is warm.     Capillary Refill: Capillary refill takes less than 2 seconds.  Neurological:     General: No focal deficit present.     Mental Status: He is alert.     GCS: GCS eye subscore is 4. GCS verbal subscore is 5. GCS motor subscore is 6.     Sensory: Sensation is intact. No sensory deficit.      Motor: No weakness, abnormal muscle tone or pronator drift.     Comments: CN II through XII grossly intact.  Speech clear.  Mentating well.  No pronator drift.     ED Results / Procedures / Treatments   Labs (all labs ordered are listed, but only abnormal results are displayed) Labs Reviewed  CBC WITH DIFFERENTIAL/PLATELET - Abnormal; Notable for the following components:      Result Value   Eosinophils Absolute 0.6 (*)    All other components within normal limits  BASIC METABOLIC PANEL - Abnormal; Notable for the following components:   Sodium 130 (*)    Chloride 96 (*)    Glucose, Bld 102 (*)    All other components within normal limits  URINALYSIS, ROUTINE W REFLEX MICROSCOPIC    EKG EKG Interpretation Date/Time:  Tuesday June 05 2023 11:48:07 EDT Ventricular Rate:  65 PR Interval:  148 QRS Duration:  86 QT Interval:  394 QTC Calculation: 409 R Axis:   67  Text Interpretation: Normal sinus rhythm Normal ECG When compared with ECG of 22-May-2023 11:26, No significant change was found Confirmed by Geoffery Lyons ([id number]) on 06/06/2023 11:20:35 PM  Radiology No results found.  Procedures Procedures    Medications Ordered in ED Medications - No data to display  ED Course/ Medical Decision Making/ A&P                                 Medical Decision Making Patient here from home.  States that he feels wobbly when he attempts to stand by himself.  He ambulates with a cane at baseline.  His significant other works during the day and he states he has to stay home by self and care for himself while she is at work.  He does not feel comfortable or safe performing his ADLs.  Amount and/or Complexity of Data Reviewed Labs: ordered.    Details: Labs interpreted by me, he does have hyponatremia with sodium of 130 today but this appears to be his baseline.  Labs are otherwise unremarkable. ECG/medicine tests: ordered.    Details: EKG shows normal sinus rhythm Discussion of  management or test interpretation with external provider(s): Patient here 05/22/2023 for similar symptoms.  He did have an MRI of the brain at that time without acute finding.  He ambulates with a cane at baseline due to an old injury of his right lower extremity.  He denies any new or worsening symptoms but continues to feel unsafe at home by himself while his  spouse is working.  No clinical findings to suggest emergent or new process.  I have contacted social work and they have evaluated the patient.  Plan for home health care during the day.  [facility] was contacted.  Arrangements have been made.  Patient is agreeable to this plan.  He appears appropriate for discharge home           Final Clinical Impression(s) / ED Diagnoses Final diagnoses:  Weakness of both lower extremities    Rx / DC Orders ED Discharge Orders     None         Pauline Aus, PA-C 06/10/23 1322    Gloris Manchester, MD 06/11/23 [phone]

## 2023-06-13 ENCOUNTER — Telehealth: Payer: Self-pay

## 2023-06-13 DIAGNOSIS — E876 Hypokalemia: Secondary | ICD-10-CM | POA: Diagnosis not present

## 2023-06-13 DIAGNOSIS — I10 Essential (primary) hypertension: Secondary | ICD-10-CM | POA: Diagnosis not present

## 2023-06-13 DIAGNOSIS — F1011 Alcohol abuse, in remission: Secondary | ICD-10-CM | POA: Diagnosis not present

## 2023-06-13 DIAGNOSIS — E871 Hypo-osmolality and hyponatremia: Secondary | ICD-10-CM | POA: Diagnosis not present

## 2023-06-13 DIAGNOSIS — F17210 Nicotine dependence, cigarettes, uncomplicated: Secondary | ICD-10-CM | POA: Diagnosis not present

## 2023-06-13 DIAGNOSIS — G319 Degenerative disease of nervous system, unspecified: Secondary | ICD-10-CM | POA: Diagnosis not present

## 2023-06-13 DIAGNOSIS — D649 Anemia, unspecified: Secondary | ICD-10-CM | POA: Diagnosis not present

## 2023-06-13 DIAGNOSIS — Z556 Problems related to health literacy: Secondary | ICD-10-CM | POA: Diagnosis not present

## 2023-06-13 NOTE — Telephone Encounter (Signed)
Transition Care Management Unsuccessful Follow-up Telephone Call  Date of discharge and from where:  06/05/2023 [facility]  Attempts:  1st Attempt  Reason for unsuccessful TCM follow-up call:  No answer/busy  Effa Yarrow [facility]  [facility] Population Health Community Resource Care Guide   [email or web address]  ?? [phone]   Website: [email or web address]  [email or web address]

## 2023-06-13 NOTE — Telephone Encounter (Signed)
Transition Care Management Unsuccessful Follow-up Telephone Call  Date of discharge and from where:  06/05/2023 [facility]  Attempts:  2nd Attempt  Reason for unsuccessful TCM follow-up call:  Left voice message  Kenneth Lester [facility]  [facility] Population Health Community Resource Care Guide   [email or web address]  ?? [phone]   Website: [email or web address]  [email or web address]

## 2023-06-14 DIAGNOSIS — F1011 Alcohol abuse, in remission: Secondary | ICD-10-CM | POA: Diagnosis not present

## 2023-06-14 DIAGNOSIS — E876 Hypokalemia: Secondary | ICD-10-CM | POA: Diagnosis not present

## 2023-06-14 DIAGNOSIS — G319 Degenerative disease of nervous system, unspecified: Secondary | ICD-10-CM | POA: Diagnosis not present

## 2023-06-14 DIAGNOSIS — F17210 Nicotine dependence, cigarettes, uncomplicated: Secondary | ICD-10-CM | POA: Diagnosis not present

## 2023-06-14 DIAGNOSIS — I10 Essential (primary) hypertension: Secondary | ICD-10-CM | POA: Diagnosis not present

## 2023-06-14 DIAGNOSIS — D649 Anemia, unspecified: Secondary | ICD-10-CM | POA: Diagnosis not present

## 2023-06-14 DIAGNOSIS — Z556 Problems related to health literacy: Secondary | ICD-10-CM | POA: Diagnosis not present

## 2023-06-14 DIAGNOSIS — E871 Hypo-osmolality and hyponatremia: Secondary | ICD-10-CM | POA: Diagnosis not present

## 2023-07-09 ENCOUNTER — Other Ambulatory Visit: Payer: Self-pay

## 2023-07-09 ENCOUNTER — Encounter (HOSPITAL_COMMUNITY): Payer: Self-pay | Admitting: Emergency Medicine

## 2023-07-09 ENCOUNTER — Emergency Department (HOSPITAL_COMMUNITY)
Admission: EM | Admit: 2023-07-09 | Discharge: 2023-07-10 | Disposition: A | Discharge status: Home / Self Care | Payer: Medicare PPO | Attending: Emergency Medicine | Admitting: Emergency Medicine

## 2023-07-09 DIAGNOSIS — K5901 Slow transit constipation: Secondary | ICD-10-CM | POA: Insufficient documentation

## 2023-07-09 DIAGNOSIS — K5900 Constipation, unspecified: Secondary | ICD-10-CM | POA: Diagnosis not present

## 2023-07-09 DIAGNOSIS — R109 Unspecified abdominal pain: Secondary | ICD-10-CM | POA: Diagnosis not present

## 2023-07-09 DIAGNOSIS — Z720 Tobacco use: Secondary | ICD-10-CM | POA: Diagnosis not present

## 2023-07-09 DIAGNOSIS — E871 Hypo-osmolality and hyponatremia: Secondary | ICD-10-CM | POA: Insufficient documentation

## 2023-07-09 DIAGNOSIS — Z79899 Other long term (current) drug therapy: Secondary | ICD-10-CM | POA: Insufficient documentation

## 2023-07-09 DIAGNOSIS — I10 Essential (primary) hypertension: Secondary | ICD-10-CM | POA: Insufficient documentation

## 2023-07-09 DIAGNOSIS — R1084 Generalized abdominal pain: Secondary | ICD-10-CM | POA: Diagnosis not present

## 2023-07-09 NOTE — ED Triage Notes (Signed)
Pt to ed from home via [facility] with  c/o Constipation for 3-4 days. Pt has taken otc laxatives with no relief. Pt not in any distress at this time.

## 2023-07-10 ENCOUNTER — Emergency Department (HOSPITAL_COMMUNITY): Payer: Medicare PPO

## 2023-07-10 DIAGNOSIS — K5900 Constipation, unspecified: Secondary | ICD-10-CM | POA: Diagnosis not present

## 2023-07-10 DIAGNOSIS — R109 Unspecified abdominal pain: Secondary | ICD-10-CM | POA: Diagnosis not present

## 2023-07-10 LAB — URINALYSIS, ROUTINE W REFLEX MICROSCOPIC
Bilirubin Urine: NEGATIVE
Glucose, UA: NEGATIVE mg/dL
Hgb urine dipstick: NEGATIVE
Ketones, ur: NEGATIVE mg/dL
Leukocytes,Ua: NEGATIVE
Nitrite: NEGATIVE
Protein, ur: NEGATIVE mg/dL
Specific Gravity, Urine: 1.004 — ABNORMAL LOW (ref 1.005–1.030)
pH: 7 (ref 5.0–8.0)

## 2023-07-10 LAB — COMPREHENSIVE METABOLIC PANEL
ALT: 17 U/L (ref 0–44)
AST: 18 U/L (ref 15–41)
Albumin: 4.4 g/dL (ref 3.5–5.0)
Alkaline Phosphatase: 60 U/L (ref 38–126)
Anion gap: 10 (ref 5–15)
BUN: 8 mg/dL (ref 8–23)
CO2: 24 mmol/L (ref 22–32)
Calcium: 9.2 mg/dL (ref 8.9–10.3)
Chloride: 91 mmol/L — ABNORMAL LOW (ref 98–111)
Creatinine, Ser: 0.69 mg/dL (ref 0.61–1.24)
GFR, Estimated: >60 mL/min (ref >60)
Glucose, Bld: 102 mg/dL — ABNORMAL HIGH (ref 70–99)
Potassium: 3.5 mmol/L (ref 3.5–5.1)
Sodium: 125 mmol/L — ABNORMAL LOW (ref 135–145)
Total Bilirubin: 0.6 mg/dL (ref 0.3–1.2)
Total Protein: 7.7 g/dL (ref 6.5–8.1)

## 2023-07-10 LAB — CBC
HCT: 38.6 % — ABNORMAL LOW (ref 39.0–52.0)
Hemoglobin: 13.8 g/dL (ref 13.0–17.0)
MCH: 31.7 pg (ref 26.0–34.0)
MCHC: 35.8 g/dL (ref 30.0–36.0)
MCV: 88.5 fL (ref 80.0–100.0)
Platelets: 259 10*3/uL (ref 150–400)
RBC: 4.36 MIL/uL (ref 4.22–5.81)
RDW: 12.8 % (ref 11.5–15.5)
WBC: 8.1 10*3/uL (ref 4.0–10.5)
nRBC: 0 % (ref 0.0–0.2)

## 2023-07-10 LAB — LIPASE, BLOOD: Lipase: 33 U/L (ref 11–51)

## 2023-07-10 MED ORDER — SODIUM CHLORIDE 0.9 % IV BOLUS
1000.0000 mL | Freq: Once | INTRAVENOUS | Status: AC
  Administered 2023-07-10: 1000 mL via INTRAVENOUS

## 2023-07-10 MED ORDER — IOHEXOL 300 MG/ML  SOLN
100.0000 mL | Freq: Once | INTRAMUSCULAR | Status: AC | PRN
  Administered 2023-07-10: 100 mL via INTRAVENOUS

## 2023-07-10 MED ORDER — POLYETHYLENE GLYCOL 3350 17 G PO PACK: 17.0000 g | PACK | Freq: Every day | ORAL | 0 refills | Status: DC

## 2023-07-10 MED ORDER — LACTULOSE 20 GM/30ML PO SOLN: 30.0000 mL | Freq: Every day | ORAL | 0 refills | Status: DC

## 2023-07-10 NOTE — ED Notes (Signed)
Patient transported to CT 

## 2023-07-10 NOTE — ED Provider Notes (Signed)
[place] EMERGENCY DEPARTMENT AT [facility] Provider Note   CSN: [id number] Arrival date & time: 07/09/23  2337     History  Chief Complaint  Patient presents with   Constipation    Kenneth Lester is a 67 y.o. male.  The history is provided by the patient.  Constipation Associated symptoms: no fever   Patient with history of hypertension, previous alcohol use, presents with constipation.  Patient reports no bowel movements for up to 3 days.  He is reported passing very little flatus.  No nausea or vomiting.  He reports he has abdominal pain when he lies flat on his abdomen.  He has tried home laxatives without relief He denies any previous abdominal surgery.    Past Medical History:  Diagnosis Date   Alcohol abuse    Hypertension    Use of cane as ambulatory aid    Vertigo     Home Medications Prior to Admission medications   Medication Sig Start Date End Date Taking? Authorizing Provider  amLODipine (NORVASC) 10 MG tablet Take 10 mg by mouth daily.   Yes [provider]  hydrochlorothiazide (HYDRODIURIL) 25 MG tablet Take 25 mg by mouth daily.   Yes [provider]  Lactulose 20 GM/30ML SOLN Take 30 mLs (20 g total) by mouth daily. 07/10/23  Yes [name], MD  losartan (COZAAR) 50 MG tablet Take 1 tablet by mouth daily. 10/24/18  Yes [provider]  Multiple Vitamin (MULTIVITAMIN WITH MINERALS) TABS tablet Take 1 tablet by mouth daily. 05/04/23   [name], MD      Allergies    Other    Review of Systems   Review of Systems  Constitutional:  Negative for fever.  Gastrointestinal:  Positive for constipation.    Physical Exam Updated Vital Signs BP 138/79 (BP Location: Left Arm)   Pulse 71   Temp 98.4 F (36.9 C) (Oral)   Resp 18   Ht 1.727 m (5\' 8" )   Wt 58 kg   SpO2 99%   BMI 19.44 kg/m  Physical Exam CONSTITUTIONAL: Elderly, appears older than stated age HEAD: Normocephalic/atraumatic EYES:  EOMI/PERRL ENMT: Mucous membranes moist NECK: supple no meningeal signs CV: S1/S2 noted, no murmurs/rubs/gallops noted LUNGS: Lungs are clear to auscultation bilaterally, no apparent distress ABDOMEN: soft, moderate tenderness in the left lower quadrant, he is distended.  Bowel sounds are noted. GU: Exam chaperoned by nurse Toni Amend No inguinal hernia. Rectal exam performed, no melena, no blood.  No stool impaction.  No rectal mass or abscess NEURO: Pt is awake/alert/appropriate EXTREMITIES: pulses normal/equal, full ROM SKIN: warm, color normal   ED Results / Procedures / Treatments   Labs (all labs ordered are listed, but only abnormal results are displayed) Labs Reviewed  COMPREHENSIVE METABOLIC PANEL - Abnormal; Notable for the following components:      Result Value   Sodium 125 (*)    Chloride 91 (*)    Glucose, Bld 102 (*)    All other components within normal limits  CBC - Abnormal; Notable for the following components:   HCT 38.6 (*)    All other components within normal limits  URINALYSIS, ROUTINE W REFLEX MICROSCOPIC - Abnormal; Notable for the following components:   Color, Urine STRAW (*)    Specific Gravity, Urine 1.004 (*)    All other components within normal limits  LIPASE, BLOOD    EKG None  Radiology CT ABDOMEN PELVIS W CONTRAST  Result Date: 07/10/2023 CLINICAL  DATA:  Nonlocalized abdominal pain with constipation EXAM: CT ABDOMEN AND PELVIS WITH CONTRAST TECHNIQUE: Multidetector CT imaging of the abdomen and pelvis was performed using the standard protocol following bolus administration of intravenous contrast. RADIATION DOSE REDUCTION: This exam was performed according to the departmental dose-optimization program which includes automated exposure control, adjustment of the mA and/or kV according to patient size and/or use of iterative reconstruction technique. CONTRAST:  OMNIPAQUE IOHEXOL 300 MG/ML  SOLN COMPARISON:  None Available. FINDINGS: Lower  chest: Circumferential low-density thickening of the esophagus Hepatobiliary: No focal liver abnormality.No evidence of biliary obstruction or stone. Pancreas: Unremarkable. Spleen: Unremarkable. Adrenals/Urinary Tract: Negative adrenals. No hydronephrosis or stone. Unremarkable bladder. Stomach/Bowel: Generalized gas and stool distention of the colon. No small bowel obstruction or visible inflammation. Appendix is not well identified. No secondary signs of appendicitis Vascular/Lymphatic: Extensive atheromatous calcification of the aorta and branch vessels. No mass or adenopathy. Reproductive:No pathologic findings. Other: No ascites or pneumoperitoneum. Musculoskeletal: No acute abnormalities. IMPRESSION: Diffuse gas and stool distention of the colon without underlying obstruction or inflammation. Generalized prominent esophageal thickness, correlate for esophagitis symptoms. Electronically Signed   By: Tiburcio Pea M.D.   On: 07/10/2023 04:00    Procedures Procedures    Medications Ordered in ED Medications  sodium chloride 0.9 % bolus 1,000 mL (1,000 mLs Intravenous New Bag/Given 07/10/23 0239)  iohexol (OMNIPAQUE) 300 MG/ML solution 100 mL (100 mLs Intravenous Contrast Given 07/10/23 0323)    ED Course/ Medical Decision Making/ A&P Clinical Course as of 07/10/23 0532  Tue Jul 10, 2023  0232 Patient presents with constipation for up to 3 days.  He also has focal left lower quadrant abdominal tenderness.  Patient is a poor historian with very poor insight into his illness.  I am concerned for an intra-abdominal process potentially a bowel obstruction.  Patient agrees for CT imaging [DW]  0451 CT reveals gas and stool distention, but no acute findings.  No obstruction, no perforation.  Labs reveal chronic hyponatremia.  No signs of leukocytosis.  Patient is not septic appearing.  He is now taking p.o. fluids.  At this point patient is safe for discharge home.  Will place patient on MiraLAX.  He  will need to increase his fluid intake. [DW]  [phone] Patient can use to rest comfortably.  No vomiting.  His abdomen is soft no focal tenderness  I spoke to his wife via phone.  She will come pick him up.  She reports he has been using MiraLAX for about a week, therefore we will start lactulose instead.  This has been sent to his pharmacy [DW]    Clinical Course User Index [DW] [name], MD                                 Medical Decision Making Amount and/or Complexity of Data Reviewed Labs: ordered. Radiology: ordered.  Risk Prescription drug management.   This patient presents to the ED for concern of constipation, this involves an extensive number of treatment options, and is a complaint that carries with it a high risk of complications and morbidity.  The differential diagnosis includes but is not limited to cholecystitis, cholelithiasis, pancreatitis, gastritis, peptic ulcer disease, appendicitis, bowel obstruction, bowel perforation,    Comorbidities that complicate the patient evaluation: Patient's presentation is complicated by their history of hypertension  Social Determinants of Health: Patient's  poor health literacy, tobacco use   increases  the complexity of managing their presentation  Additional history obtained: Records reviewed previous admission documents  Lab Tests: I Ordered, and personally interpreted labs.  The pertinent results include: Chronic hyponatremia  Imaging Studies ordered: I ordered imaging studies including CT scan abdomen pelvis   I independently visualized and interpreted imaging which showed no acute findings, no signs of bowel obstruction I agree with the radiologist interpretation   Reevaluation: After the interventions noted above, I reevaluated the patient and found that they have :improved  Complexity of problems addressed: Patient's presentation is most consistent with  acute presentation with potential threat to life or  bodily function  Disposition: After consideration of the diagnostic results and the patient's response to treatment,  I feel that the patent would benefit from discharge   .           Final Clinical Impression(s) / ED Diagnoses Final diagnoses:  Slow transit constipation    Rx / DC Orders ED Discharge Orders          Ordered    polyethylene glycol (MIRALAX / GLYCOLAX) 17 g packet  Daily,   Status:  Discontinued        07/10/23 0453    Lactulose 20 GM/30ML SOLN  Daily        07/10/23 0531              [name], MD 07/10/23 [phone]

## 2023-07-13 ENCOUNTER — Emergency Department (HOSPITAL_COMMUNITY): Payer: Medicare PPO

## 2023-07-13 ENCOUNTER — Emergency Department (HOSPITAL_COMMUNITY)
Admission: EM | Admit: 2023-07-13 | Discharge: 2023-07-13 | Disposition: A | Discharge status: Home / Self Care | Payer: Medicare PPO | Attending: Emergency Medicine | Admitting: Emergency Medicine

## 2023-07-13 ENCOUNTER — Encounter (HOSPITAL_COMMUNITY): Payer: Self-pay

## 2023-07-13 ENCOUNTER — Other Ambulatory Visit: Payer: Self-pay

## 2023-07-13 DIAGNOSIS — E538 Deficiency of other specified B group vitamins: Secondary | ICD-10-CM | POA: Diagnosis present

## 2023-07-13 DIAGNOSIS — Z79899 Other long term (current) drug therapy: Secondary | ICD-10-CM | POA: Diagnosis not present

## 2023-07-13 DIAGNOSIS — R42 Dizziness and giddiness: Secondary | ICD-10-CM | POA: Diagnosis present

## 2023-07-13 DIAGNOSIS — F1010 Alcohol abuse, uncomplicated: Secondary | ICD-10-CM | POA: Diagnosis present

## 2023-07-13 DIAGNOSIS — Z23 Encounter for immunization: Secondary | ICD-10-CM | POA: Diagnosis present

## 2023-07-13 DIAGNOSIS — Q438 Other specified congenital malformations of intestine: Secondary | ICD-10-CM | POA: Diagnosis not present

## 2023-07-13 DIAGNOSIS — R531 Weakness: Secondary | ICD-10-CM | POA: Diagnosis not present

## 2023-07-13 DIAGNOSIS — T502X5A Adverse effect of carbonic-anhydrase inhibitors, benzothiadiazides and other diuretics, initial encounter: Secondary | ICD-10-CM | POA: Diagnosis present

## 2023-07-13 DIAGNOSIS — M6281 Muscle weakness (generalized): Secondary | ICD-10-CM | POA: Diagnosis not present

## 2023-07-13 DIAGNOSIS — I10 Essential (primary) hypertension: Secondary | ICD-10-CM | POA: Diagnosis present

## 2023-07-13 DIAGNOSIS — F17200 Nicotine dependence, unspecified, uncomplicated: Secondary | ICD-10-CM | POA: Diagnosis not present

## 2023-07-13 DIAGNOSIS — D519 Vitamin B12 deficiency anemia, unspecified: Secondary | ICD-10-CM | POA: Diagnosis not present

## 2023-07-13 DIAGNOSIS — N281 Cyst of kidney, acquired: Secondary | ICD-10-CM | POA: Diagnosis not present

## 2023-07-13 DIAGNOSIS — F17210 Nicotine dependence, cigarettes, uncomplicated: Secondary | ICD-10-CM | POA: Diagnosis present

## 2023-07-13 DIAGNOSIS — F1011 Alcohol abuse, in remission: Secondary | ICD-10-CM | POA: Diagnosis not present

## 2023-07-13 DIAGNOSIS — E871 Hypo-osmolality and hyponatremia: Secondary | ICD-10-CM | POA: Diagnosis present

## 2023-07-13 DIAGNOSIS — E876 Hypokalemia: Secondary | ICD-10-CM | POA: Diagnosis present

## 2023-07-13 DIAGNOSIS — R279 Unspecified lack of coordination: Secondary | ICD-10-CM | POA: Diagnosis not present

## 2023-07-13 DIAGNOSIS — K5900 Constipation, unspecified: Secondary | ICD-10-CM

## 2023-07-13 DIAGNOSIS — N3289 Other specified disorders of bladder: Secondary | ICD-10-CM | POA: Diagnosis not present

## 2023-07-13 DIAGNOSIS — R1084 Generalized abdominal pain: Secondary | ICD-10-CM | POA: Diagnosis not present

## 2023-07-13 LAB — COMPREHENSIVE METABOLIC PANEL
ALT: 24 U/L (ref 0–44)
AST: 24 U/L (ref 15–41)
Albumin: 4.8 g/dL (ref 3.5–5.0)
Alkaline Phosphatase: 59 U/L (ref 38–126)
Anion gap: 11 (ref 5–15)
BUN: 7 mg/dL — ABNORMAL LOW (ref 8–23)
CO2: 23 mmol/L (ref 22–32)
Calcium: 9.5 mg/dL (ref 8.9–10.3)
Chloride: 91 mmol/L — ABNORMAL LOW (ref 98–111)
Creatinine, Ser: 0.67 mg/dL (ref 0.61–1.24)
GFR, Estimated: >60 mL/min (ref >60)
Glucose, Bld: 103 mg/dL — ABNORMAL HIGH (ref 70–99)
Potassium: 3.5 mmol/L (ref 3.5–5.1)
Sodium: 125 mmol/L — ABNORMAL LOW (ref 135–145)
Total Bilirubin: 0.6 mg/dL (ref 0.3–1.2)
Total Protein: 7.9 g/dL (ref 6.5–8.1)

## 2023-07-13 LAB — CBC WITH DIFFERENTIAL/PLATELET
Abs Immature Granulocytes: 0.02 10*3/uL (ref 0.00–0.07)
Basophils Absolute: 0 10*3/uL (ref 0.0–0.1)
Basophils Relative: 0 %
Eosinophils Absolute: 1 10*3/uL — ABNORMAL HIGH (ref 0.0–0.5)
Eosinophils Relative: 14 %
HCT: 39.3 % (ref 39.0–52.0)
Hemoglobin: 14 g/dL (ref 13.0–17.0)
Immature Granulocytes: 0 %
Lymphocytes Relative: 19 %
Lymphs Abs: 1.3 10*3/uL (ref 0.7–4.0)
MCH: 31.6 pg (ref 26.0–34.0)
MCHC: 35.6 g/dL (ref 30.0–36.0)
MCV: 88.7 fL (ref 80.0–100.0)
Monocytes Absolute: 0.6 10*3/uL (ref 0.1–1.0)
Monocytes Relative: 9 %
Neutro Abs: 3.8 10*3/uL (ref 1.7–7.7)
Neutrophils Relative %: 58 %
Platelets: 267 10*3/uL (ref 150–400)
RBC: 4.43 MIL/uL (ref 4.22–5.81)
RDW: 12.9 % (ref 11.5–15.5)
WBC: 6.8 10*3/uL (ref 4.0–10.5)
nRBC: 0 % (ref 0.0–0.2)

## 2023-07-13 LAB — LIPASE, BLOOD: Lipase: 31 U/L (ref 11–51)

## 2023-07-13 MED ORDER — IOHEXOL 9 MG/ML PO SOLN
ORAL | Status: AC
  Filled 2023-07-13: qty 1000

## 2023-07-13 MED ORDER — BISACODYL 5 MG PO TBEC
5.0000 mg | DELAYED_RELEASE_TABLET | Freq: Once | ORAL | Status: AC
  Administered 2023-07-13: 5 mg via ORAL
  Filled 2023-07-13: qty 1

## 2023-07-13 MED ORDER — BISACODYL 5 MG PO TBEC: 5.0000 mg | DELAYED_RELEASE_TABLET | Freq: Two times a day (BID) | ORAL | 0 refills | Status: DC

## 2023-07-13 MED ORDER — BISACODYL 10 MG RE SUPP: 10.0000 mg | Freq: Once | RECTAL | Status: DC

## 2023-07-13 NOTE — Discharge Instructions (Addendum)
You are seen in the emergency department today for constipation.  Thankfully your labs and imaging are reassuring although there is some level stool burden in your colon.  Please continue to take the MiraLAX, lactulose as well as the Dulcolax that I prescribed.  You may also attempt to use a Fleet enema to help promote a bowel movement.  If you have any worsening symptoms, return the emergency department.

## 2023-07-13 NOTE — ED Triage Notes (Signed)
Pt brought in by [facility] for constipation. Pt has been here on 07/09/23 for same problem happening over several weeks now. Per pt was rx some medication and he is losing his appetite, only drinking fluids, only able to have a small drop of poop yesterday.

## 2023-07-13 NOTE — ED Notes (Signed)
Family in room. Wanting d/c

## 2023-07-13 NOTE — ED Notes (Signed)
Pt instructed for urine sample

## 2023-07-13 NOTE — ED Provider Notes (Signed)
[place] [facility] DE[facility]MENT AT [facility] Provider Note   CSN: [id number] Arrival date & time: 07/13/23  1155     History Chief Complaint  Patient presents with   Constipation    Kenneth Lester is a 67 y.o. male.  Patient presents to the emergency department concerns of constipation.  Patient with past history significant for hypertension, hyponatremia, alcohol abuse who complained of abdominal pain and constipation ongoing for the last week or so.  Patient reportedly seen on 07/09/2023 for similar concerns.  States that this has not improved since then but does report that he maybe had a loose bowel movement yesterday that was more liquid than firm formed stool.  Patient was prescribed lactulose and states that he only started taking it this morning.  Believes that he may have been taking MiraLAX as well but is unsure.  Patient is not a great historian regarding his own health.   Constipation Associated symptoms: abdominal pain        Home Medications Prior to Admission medications   Medication Sig Start Date End Date Taking? Authorizing Provider  bisacodyl (DULCOLAX) 5 MG EC tablet Take 1 tablet (5 mg total) by mouth 2 (two) times daily. 07/13/23  Yes Maryanna Shape A, PA-C  amLODipine (NORVASC) 10 MG tablet Take 10 mg by mouth daily.    [provider]  hydrochlorothiazide (HYDRODIURIL) 25 MG tablet Take 25 mg by mouth daily.    [provider]  Lactulose 20 GM/30ML SOLN Take 30 mLs (20 g total) by mouth daily. 07/10/23   [name], MD  losartan (COZAAR) 50 MG tablet Take 1 tablet by mouth daily. 10/24/18   [provider]  Multiple Vitamin (MULTIVITAMIN WITH MINERALS) TABS tablet Take 1 tablet by mouth daily. 05/04/23   [name], MD      Allergies    Other    Review of Systems   Review of Systems  Gastrointestinal:  Positive for abdominal pain and constipation.  All other systems reviewed and are  negative.   Physical Exam Updated Vital Signs BP (!) 171/89   Pulse 73   Temp 97.7 F (36.5 C) (Oral)   Resp 18   Ht 5\' 8"  (1.727 m)   Wt 61.2 kg   SpO2 96%   BMI 20.53 kg/m  Physical Exam Vitals and nursing note reviewed.  Constitutional:      General: He is not in acute distress.    Appearance: He is well-developed.  HENT:     Head: Normocephalic and atraumatic.  Eyes:     Conjunctiva/sclera: Conjunctivae normal.  Cardiovascular:     Rate and Rhythm: Normal rate and regular rhythm.     Heart sounds: No murmur heard. Pulmonary:     Effort: Pulmonary effort is normal. No respiratory distress.     Breath sounds: Normal breath sounds.  Abdominal:     General: Abdomen is flat. Bowel sounds are normal.     Palpations: Abdomen is soft.     Tenderness: There is abdominal tenderness in the left upper quadrant and left lower quadrant.  Musculoskeletal:        General: No swelling.     Cervical back: Neck supple.  Skin:    General: Skin is warm and dry.     Capillary Refill: Capillary refill takes less than 2 seconds.  Neurological:     Mental Status: He is alert.  Psychiatric:        Mood and Affect: Mood normal.  ED Results / Procedures / Treatments   Labs (all labs ordered are listed, but only abnormal results are displayed) Labs Reviewed  CBC WITH DIFFERENTIAL/PLATELET - Abnormal; Notable for the following components:      Result Value   Eosinophils Absolute 1.0 (*)    All other components within normal limits  COMPREHENSIVE METABOLIC PANEL - Abnormal; Notable for the following components:   Sodium 125 (*)    Chloride 91 (*)    Glucose, Bld 103 (*)    BUN 7 (*)    All other components within normal limits  LIPASE, BLOOD    EKG None  Radiology No results found.  Procedures Procedures   Medications Ordered in ED Medications  bisacodyl (DULCOLAX) EC tablet 5 mg (5 mg Oral Given 07/13/23 2055)    ED Course/ Medical Decision Making/ A&P                                Medical Decision Making Amount and/or Complexity of Data Reviewed Labs: ordered. Radiology: ordered.  Risk OTC drugs.   This patient presents to the ED for concern of abdominal pain.  Differential diagnosis includes the patient, bowel obstruction, UTI, cholecystitis, diverticulitis   Lab Tests:  I Ordered, and personally interpreted labs.  The pertinent results include: CBC unremarkable, CMP with hyponatremia 125 but appears to be somewhat close to patient's baseline, lipase unremarkable   Imaging Studies ordered:  I ordered imaging studies including CT abdomen pelvis I independently visualized and interpreted imaging which showed no evidence of bowel obstruction there continues to be diffuse distention in the abdomen due to stool burden I agree with the radiologist interpretation   Medicines ordered and prescription drug management:  I ordered medication including Dulcolax for constipation Reevaluation of the patient after these medicines showed that the patient stayed the same I have reviewed the patients home medicines and have made adjustments as needed   Problem List / ED Course:  Patient with past history significant for hypertension, alcohol use disorder, abdominal pain, constipation presents emergency department concerns of constipation.   States that he was seen here a few days ago and sent home with lactulose. Denies that this has been able to provide a significant BM. Maybe had some loose stool yesterday but patient is not sure. Currently states pain is all on left side of abdomen without radiation. Denies any fevers, chest pain, shortness of breath, or vomiting. Lab workup is unremarkable.  CT imaging does show stool burden still present but no signs of bowel obstruction.  Given that patient has not been taking together the lactulose and the MiraLAX, advised patient that he needs to continue to monitor symptoms while he is on these medications for  promotion of bowel movement.  Also did encourage adequate hydration as patient reports that he is not eating or drinking well at this time due to the constipation.  Given no evidence of bowel obstruction, do not have any acute indication for admission at this time.  A prescription for Dulcolax was sent to patient's pharmacy to continue to use in addition to other medications to try to promote a bowel movement.  Advised strict return precaution with patient.  Also encouraged outpatient follow-up with primary care provider as needed.  Patient is agreeable with current treatment plan verbalized understanding strict return precautions.  Patient discharged home in stable condition.  Final Clinical Impression(s) / ED Diagnoses Final diagnoses:  Constipation, unspecified constipation  type    Rx / DC Orders ED Discharge Orders          Ordered    bisacodyl (DULCOLAX) 5 MG EC tablet  2 times daily        07/13/23 2048              Salomon Mast 07/15/23 2242    Derwood Kaplan, MD 07/18/23 1031

## 2023-07-13 NOTE — ED Notes (Signed)
Pt uses cane at baseline. Stand by walk with patient to restroom. Pt states when he gets dc his wife should pick him up

## 2023-07-13 NOTE — ED Notes (Signed)
ED Provider at bedside. 

## 2023-07-13 NOTE — ED Notes (Signed)
Pt keeps asking for food. States that is why he is here; that he is just eating junk food and its making his stomach hurt wants lettuce.

## 2023-07-13 NOTE — ED Notes (Signed)
Pt in bathroom attempting to have a bowl movement

## 2023-07-13 NOTE — ED Notes (Signed)
Pt returned from CT °

## 2023-07-13 NOTE — ED Notes (Signed)
Alert, NAD, calm, interactive. Pt called out as this RN was passing by. Requested assistance to b/r. Assisted to b/r across the hall via w/c. Self transfers on own with cane. C/o abd pain and constipation. States "feel like I need to move my bowels". CBIR. Using CB d/w pt, verbalizes understanding. Endorses abd pain. Denies NVD or fever.

## 2023-07-14 ENCOUNTER — Emergency Department (HOSPITAL_COMMUNITY)
Admission: EM | Admit: 2023-07-14 | Discharge: 2023-07-14 | Disposition: A | Discharge status: Home / Self Care | Payer: Medicare PPO | Attending: Emergency Medicine | Admitting: Emergency Medicine

## 2023-07-14 ENCOUNTER — Encounter (HOSPITAL_COMMUNITY): Payer: Self-pay | Admitting: Emergency Medicine

## 2023-07-14 DIAGNOSIS — K5900 Constipation, unspecified: Secondary | ICD-10-CM | POA: Insufficient documentation

## 2023-07-14 DIAGNOSIS — Z79899 Other long term (current) drug therapy: Secondary | ICD-10-CM | POA: Insufficient documentation

## 2023-07-14 DIAGNOSIS — I10 Essential (primary) hypertension: Secondary | ICD-10-CM | POA: Insufficient documentation

## 2023-07-14 NOTE — ED Notes (Signed)
Pt having active flatulence during discharge process. Pt requesting a wheelchair to sit in to get to the waiting room, where he wants to wait to call for a ride home. Pt verbalizes he called an ambulance to come here to sleep because he couldn't get a hold of anyone to call.

## 2023-07-14 NOTE — ED Provider Notes (Signed)
[place] EMERGENCY DEPARTMENT AT [facility] Provider Note   CSN: [id number] Arrival date & time: 07/14/23  0043     History  Chief Complaint  Patient presents with   Constipation    MONTRAVIOUS DESPAIN is a 67 y.o. male.  Patient is a 67 year old male with history of hypertension presenting with complaints of constipation.  He was seen earlier today with similar complaints.  He had laboratory studies and CT scan showing increased stool in his large intestine, but no significant impaction or stool quantity in the rectum.  He was discharged with continued use of lactulose.  Patient returns requesting an enema.  No fevers or chills.  No aggravating or alleviating factors.  The history is provided by the patient.       Home Medications Prior to Admission medications   Medication Sig Start Date End Date Taking? Authorizing Provider  amLODipine (NORVASC) 10 MG tablet Take 10 mg by mouth daily.    [provider]  bisacodyl (DULCOLAX) 5 MG EC tablet Take 1 tablet (5 mg total) by mouth 2 (two) times daily. 07/13/23   [name], PA-C  hydrochlorothiazide (HYDRODIURIL) 25 MG tablet Take 25 mg by mouth daily.    [provider]  Lactulose 20 GM/30ML SOLN Take 30 mLs (20 g total) by mouth daily. 07/10/23   [name], MD  losartan (COZAAR) 50 MG tablet Take 1 tablet by mouth daily. 10/24/18   [provider]  Multiple Vitamin (MULTIVITAMIN WITH MINERALS) TABS tablet Take 1 tablet by mouth daily. 05/04/23   [name], MD      Allergies    Other    Review of Systems   Review of Systems  All other systems reviewed and are negative.   Physical Exam Updated Vital Signs BP (!) 153/81 (BP Location: Right Arm)   Pulse 67   Temp 98.2 F (36.8 C) (Oral)   Resp 18   Ht 5\' 8"  (1.727 m)   Wt 61 kg   SpO2 100%   BMI 20.45 kg/m  Physical Exam Vitals and nursing note reviewed.  Constitutional:      General: He is not in acute  distress.    Appearance: He is well-developed. He is not diaphoretic.  HENT:     Head: Normocephalic and atraumatic.  Cardiovascular:     Rate and Rhythm: Normal rate and regular rhythm.     Heart sounds: No murmur heard.    No friction rub.  Pulmonary:     Effort: Pulmonary effort is normal. No respiratory distress.     Breath sounds: Normal breath sounds. No wheezing or rales.  Abdominal:     General: Bowel sounds are normal. There is no distension.     Palpations: Abdomen is soft.     Tenderness: There is no abdominal tenderness.  Musculoskeletal:        General: Normal range of motion.     Cervical back: Normal range of motion and neck supple.  Skin:    General: Skin is warm and dry.  Neurological:     Mental Status: He is alert and oriented to person, place, and time.     Coordination: Coordination normal.     ED Results / Procedures / Treatments   Labs (all labs ordered are listed, but only abnormal results are displayed) Labs Reviewed - No data to display  EKG None  Radiology CT ABDOMEN PELVIS WO CONTRAST  Result Date: 07/13/2023 CLINICAL DATA:  Constipation. EXAM:  CT ABDOMEN AND PELVIS WITHOUT CONTRAST TECHNIQUE: Multidetector CT imaging of the abdomen and pelvis was performed following the standard protocol without IV contrast. RADIATION DOSE REDUCTION: This exam was performed according to the departmental dose-optimization program which includes automated exposure control, adjustment of the mA and/or kV according to patient size and/or use of iterative reconstruction technique. COMPARISON:  CT scan 07/10/2023 FINDINGS: Lower chest: Breathing motion along the lung bases. Mild basilar atelectasis. No pleural effusion. Wall thickening along the lower esophagus. Please correlate with any symptoms. Hepatobiliary: On this non IV contrast exam, grossly preserved hepatic parenchyma. Gallbladder is nondilated. Pancreas: Unremarkable. No pancreatic ductal dilatation or  surrounding inflammatory changes. Spleen: Normal in size without focal abnormality. Adrenals/Urinary Tract: Stable mild thickening of the adrenal glands with some nodularity on the right, unchanged. The more focal area along the lateral limb of the right adrenal gland has a diameter 12 mm. Not clearly an adenoma. No abnormal calcifications are seen within either kidney. Distended urinary bladder. Stable lower pole left-sided benign-appearing renal cysts. No specific imaging follow-up. Bosniak 2 lesion. Stomach/Bowel: Once again there is diffuse colonic stool. Overall moderate amount of stool is seen. More in the transverse and ascending colon. There is a redundant course of the sigmoid colon extending into the right upper quadrant, epigastric region, anterior to the stomach. No twisting of the mesentery or bowel dilatation. There is some contrast in the stomach and nondilated loops of small bowel. Vascular/Lymphatic: Extensive vascular calcifications. Normal caliber aorta and IVC. No specific abnormal lymph node enlargement identified on this noncontrast examination. Reproductive: Prostate is unremarkable. Other: There is streak artifact from the arms being scanned at the patient's side as well as motion limiting evaluation. Musculoskeletal: Osteopenia and degenerative changes along the spine. Transitional lumbosacral segment. IMPRESSION: No bowel obstruction but there is continued diffuse moderate to severe colonic stool. Redundant course of the sigmoid colon extending into the epigastric region, anterior to the stomach. No obstructing renal stone.  Distended urinary bladder. Wall thickening once again seen of the distal esophagus. Please correlate with symptomatology. Electronically Signed   By: Karen Kays M.D.   On: 07/13/2023 16:55    Procedures Procedures    Medications Ordered in ED Medications - No data to display  ED Course/ Medical Decision Making/ A&P  Patient presenting again with  constipation.  CT scan and laboratory studies from earlier today reviewed and showing stool within the colon, but no fecal impaction.  I will advise patient to take magnesium citrate.  He is to follow-up as needed.  Final Clinical Impression(s) / ED Diagnoses Final diagnoses:  None    Rx / DC Orders ED Discharge Orders     None         Geoffery Lyons, MD 07/14/23 [phone]

## 2023-07-14 NOTE — ED Triage Notes (Signed)
Pt brought in by EMS because he states when he was here earlier, "the doc mentioned something about an enema and he wants one".

## 2023-07-14 NOTE — Discharge Instructions (Signed)
Magnesium citrate: Drink the entire 10 ounce bottle for relief of constipation.  This medication is available over-the-counter.  Follow-up with primary doctor if not improving.

## 2023-07-16 ENCOUNTER — Inpatient Hospital Stay (HOSPITAL_COMMUNITY)
Admission: EM | Admit: 2023-07-16 | Discharge: 2023-07-19 | DRG: 641 | Disposition: A | Discharge status: Skilled Nursing Facility | Payer: Medicare PPO | Attending: Family Medicine | Admitting: Family Medicine

## 2023-07-16 ENCOUNTER — Encounter (HOSPITAL_COMMUNITY): Payer: Self-pay | Admitting: Emergency Medicine

## 2023-07-16 ENCOUNTER — Other Ambulatory Visit: Payer: Self-pay

## 2023-07-16 DIAGNOSIS — F17200 Nicotine dependence, unspecified, uncomplicated: Secondary | ICD-10-CM | POA: Diagnosis not present

## 2023-07-16 DIAGNOSIS — Z23 Encounter for immunization: Secondary | ICD-10-CM

## 2023-07-16 DIAGNOSIS — I10 Essential (primary) hypertension: Secondary | ICD-10-CM | POA: Diagnosis present

## 2023-07-16 DIAGNOSIS — R531 Weakness: Secondary | ICD-10-CM

## 2023-07-16 DIAGNOSIS — E538 Deficiency of other specified B group vitamins: Secondary | ICD-10-CM | POA: Diagnosis present

## 2023-07-16 DIAGNOSIS — E876 Hypokalemia: Secondary | ICD-10-CM | POA: Diagnosis present

## 2023-07-16 DIAGNOSIS — R42 Dizziness and giddiness: Secondary | ICD-10-CM | POA: Diagnosis present

## 2023-07-16 DIAGNOSIS — F1011 Alcohol abuse, in remission: Secondary | ICD-10-CM | POA: Diagnosis not present

## 2023-07-16 DIAGNOSIS — F1010 Alcohol abuse, uncomplicated: Secondary | ICD-10-CM | POA: Diagnosis present

## 2023-07-16 DIAGNOSIS — E871 Hypo-osmolality and hyponatremia: Secondary | ICD-10-CM | POA: Diagnosis present

## 2023-07-16 DIAGNOSIS — T502X5A Adverse effect of carbonic-anhydrase inhibitors, benzothiadiazides and other diuretics, initial encounter: Secondary | ICD-10-CM | POA: Diagnosis present

## 2023-07-16 DIAGNOSIS — Z79899 Other long term (current) drug therapy: Secondary | ICD-10-CM | POA: Diagnosis not present

## 2023-07-16 DIAGNOSIS — F17210 Nicotine dependence, cigarettes, uncomplicated: Secondary | ICD-10-CM | POA: Diagnosis present

## 2023-07-16 LAB — BASIC METABOLIC PANEL
Anion gap: 11 (ref 5–15)
BUN: 7 mg/dL — ABNORMAL LOW (ref 8–23)
CO2: 25 mmol/L (ref 22–32)
Calcium: 9.3 mg/dL (ref 8.9–10.3)
Chloride: 87 mmol/L — ABNORMAL LOW (ref 98–111)
Creatinine, Ser: 0.66 mg/dL (ref 0.61–1.24)
GFR, Estimated: >60 mL/min (ref >60)
Glucose, Bld: 97 mg/dL (ref 70–99)
Potassium: 3.2 mmol/L — ABNORMAL LOW (ref 3.5–5.1)
Sodium: 123 mmol/L — ABNORMAL LOW (ref 135–145)

## 2023-07-16 LAB — URINALYSIS, ROUTINE W REFLEX MICROSCOPIC
Bilirubin Urine: NEGATIVE
Glucose, UA: NEGATIVE mg/dL
Hgb urine dipstick: NEGATIVE
Ketones, ur: 5 mg/dL — AB
Leukocytes,Ua: NEGATIVE
Nitrite: NEGATIVE
Protein, ur: NEGATIVE mg/dL
Specific Gravity, Urine: 1.011 (ref 1.005–1.030)
pH: 7 (ref 5.0–8.0)

## 2023-07-16 LAB — CBG MONITORING, ED: Glucose-Capillary: 95 mg/dL (ref 70–99)

## 2023-07-16 LAB — CBC
HCT: 39.1 % (ref 39.0–52.0)
Hemoglobin: 14.1 g/dL (ref 13.0–17.0)
MCH: 31.7 pg (ref 26.0–34.0)
MCHC: 36.1 g/dL — ABNORMAL HIGH (ref 30.0–36.0)
MCV: 87.9 fL (ref 80.0–100.0)
Platelets: 286 10*3/uL (ref 150–400)
RBC: 4.45 MIL/uL (ref 4.22–5.81)
RDW: 12.7 % (ref 11.5–15.5)
WBC: 6.8 10*3/uL (ref 4.0–10.5)
nRBC: 0 % (ref 0.0–0.2)

## 2023-07-16 LAB — SODIUM, URINE, RANDOM: Sodium, Ur: 33 mmol/L

## 2023-07-16 MED ORDER — SODIUM CHLORIDE 0.9 % IV SOLN: INTRAVENOUS | Status: DC

## 2023-07-16 MED ORDER — CHLORHEXIDINE GLUCONATE CLOTH 2 % EX PADS
6.0000 | MEDICATED_PAD | Freq: Every day | CUTANEOUS | Status: DC
  Administered 2023-07-16 – 2023-07-18 (×3): 6 via TOPICAL

## 2023-07-16 MED ORDER — INFLUENZA VAC A&B SURF ANT ADJ 0.5 ML IM SUSY
0.5000 mL | PREFILLED_SYRINGE | INTRAMUSCULAR | Status: AC
  Administered 2023-07-19: 0.5 mL via INTRAMUSCULAR
  Filled 2023-07-16: qty 0.5

## 2023-07-16 MED ORDER — ORAL CARE MOUTH RINSE: 15.0000 mL | OROMUCOSAL | Status: DC | PRN

## 2023-07-16 MED ORDER — SODIUM CHLORIDE 0.9 % IV BOLUS
1000.0000 mL | Freq: Once | INTRAVENOUS | Status: AC
  Administered 2023-07-16: 1000 mL via INTRAVENOUS

## 2023-07-16 MED ORDER — POTASSIUM CHLORIDE CRYS ER 20 MEQ PO TBCR
40.0000 meq | EXTENDED_RELEASE_TABLET | Freq: Once | ORAL | Status: AC
  Administered 2023-07-16: 40 meq via ORAL
  Filled 2023-07-16: qty 2

## 2023-07-16 NOTE — ED Notes (Signed)
[place] sandwich and cup of water given to patient.

## 2023-07-16 NOTE — ED Notes (Signed)
Patient requested this RN to call wife and inform her that patient is in the hospital. Wife notified and phone given to patient per wife's request.

## 2023-07-16 NOTE — Plan of Care (Signed)

## 2023-07-16 NOTE — ED Notes (Addendum)
Pt given a sandwich bag and something to drink as per order, pt can eat and drink

## 2023-07-16 NOTE — ED Notes (Signed)
Urinal given to patient informed of need of urine specimen. Patient verbalized understanding.

## 2023-07-16 NOTE — ED Provider Notes (Signed)
[facility] AT [facility] Provider Note   CSN: [id number] Arrival date & time: 07/16/23  1211     History  Chief Complaint  Patient presents with   Dizziness    Kenneth Lester is a 67 y.o. male.   Dizziness Associated symptoms: weakness (Generalized weakness)   Associated symptoms: no chest pain, no diarrhea, no headaches, no shortness of breath and no vomiting        Kenneth Lester is a 67 y.o. male with past medical history of chronic hyponatremia, hypertension who presents to the Emergency Department complaining of generalized weakness and intermittent dizziness.  States this has been going on for months.  He feels weak in his lower extremities when he comes to stand.  He uses a cane at baseline.  He states the dizziness mostly occurs when he stands up from a seated position.  Concerned that his sodium levels may be low again.  He continues to take a diuretic as well.  Has been seen in the emergency department multiple times, this is his fourth ER visit since 07/09/2023.  Was evaluated in August for lower extremity weakness, had a home health for a while, but patient states they have not been back to his home in several weeks.    Home Medications Prior to Admission medications   Medication Sig Start Date End Date Taking? Authorizing Provider  amLODipine (NORVASC) 10 MG tablet Take 10 mg by mouth daily.    [provider]  bisacodyl (DULCOLAX) 5 MG EC tablet Take 1 tablet (5 mg total) by mouth 2 (two) times daily. 07/13/23   [name], PA-C  hydrochlorothiazide (HYDRODIURIL) 25 MG tablet Take 25 mg by mouth daily.    [provider]  Lactulose 20 GM/30ML SOLN Take 30 mLs (20 g total) by mouth daily. 07/10/23   [name], MD  losartan (COZAAR) 50 MG tablet Take 1 tablet by mouth daily. 10/24/18   [provider]  Multiple Vitamin (MULTIVITAMIN WITH MINERALS) TABS tablet Take 1 tablet by mouth daily. 05/04/23    [name], MD      Allergies    Other    Review of Systems   Review of Systems  Constitutional:  Negative for chills and fever.  Respiratory:  Negative for cough, chest tightness and shortness of breath.   Cardiovascular:  Negative for chest pain.  Gastrointestinal:  Negative for abdominal pain, diarrhea and vomiting.  Genitourinary:  Negative for dysuria and flank pain.  Neurological:  Positive for dizziness and weakness (Generalized weakness). Negative for seizures, syncope, speech difficulty and headaches.    Physical Exam Updated Vital Signs BP (!) 113/57 (BP Location: Left Arm)   Pulse 62   Temp 97.9 F (36.6 C) (Oral)   Resp 18   Ht 5\' 8"  (1.727 m)   Wt 61 kg   SpO2 98%   BMI 20.45 kg/m  Physical Exam Vitals and nursing note reviewed.  Constitutional:      General: He is not in acute distress.    Appearance: Normal appearance. He is not ill-appearing or toxic-appearing.  HENT:     Mouth/Throat:     Mouth: Mucous membranes are moist.  Eyes:     Extraocular Movements: Extraocular movements intact.     Pupils: Pupils are equal, round, and reactive to light.  Cardiovascular:     Rate and Rhythm: Normal rate and regular rhythm.     Pulses: Normal pulses.  Pulmonary:  Effort: Pulmonary effort is normal.  Abdominal:     General: There is no distension.     Palpations: Abdomen is soft.     Tenderness: There is no abdominal tenderness.  Musculoskeletal:     Right lower leg: No edema.     Left lower leg: No edema.  Skin:    General: Skin is warm.     Capillary Refill: Capillary refill takes less than 2 seconds.  Neurological:     General: No focal deficit present.     Mental Status: He is alert.     Sensory: Sensation is intact. No sensory deficit.     Motor: No weakness.     Comments: CN II through XII grossly intact.  Speech clear.  No facial droop.  No pronator drift     ED Results / Procedures / Treatments   Labs (all labs ordered are  listed, but only abnormal results are displayed) Labs Reviewed  BASIC METABOLIC PANEL - Abnormal; Notable for the following components:      Result Value   Sodium 123 (*)    Potassium 3.2 (*)    Chloride 87 (*)    BUN 7 (*)    All other components within normal limits  CBC - Abnormal; Notable for the following components:   MCHC 36.1 (*)    All other components within normal limits  URINALYSIS, ROUTINE W REFLEX MICROSCOPIC - Abnormal; Notable for the following components:   Ketones, ur 5 (*)    All other components within normal limits  SODIUM, URINE, RANDOM  OSMOLALITY  CBG MONITORING, ED    EKG EKG Interpretation Date/Time:  Monday July 16 2023 13:55:52 EDT Ventricular Rate:  62 PR Interval:  148 QRS Duration:  80 QT Interval:  414 QTC Calculation: 420 R Axis:   71  Text Interpretation: Normal sinus rhythm Septal infarct , age undetermined Abnormal ECG When compared with ECG of 05-Jun-2023 11:48, No significant change was found Confirmed by Meridee Score [id number]) on 07/16/2023 1:59:02 PM  Radiology No results found.  Procedures Procedures    Medications Ordered in ED Medications  potassium chloride SA (KLOR-CON M) CR tablet 40 mEq (40 mEq Oral Given 07/16/23 1802)  sodium chloride 0.9 % bolus 1,000 mL (1,000 mLs Intravenous New Bag/Given 07/16/23 1951)    ED Course/ Medical Decision Making/ A&P                                 Medical Decision Making Patient with history of hyponatremia here with generalized weakness and dizziness upon standing.  No hypotension.  Symptoms intermittent.  Has had multiple ER visits recently.  Was recently seen for constipation.  Electrolyte abnormality likely cause.  Do not appreciate any focal neurodeficits on my exam.  Doubt CVA/TIA.  Infectious process also considered.  No concerning symptoms for sepsis.  He is ambulatory with a cane at baseline, does not feel "steady on my feet."  Amount and/or Complexity of Data  Reviewed Labs: ordered.    Details: Labs interpreted by me, no evidence of leukocytosis, hemoglobin reassuring.  Urinalysis without evidence of infection.  Chemistries show sodium today of 123, mildly hyper kalemia as well with potassium of 3.2 it was 3.5  3 days ago  Osmolality and urine sodium pending ECG/medicine tests: ordered.    Details: EKG shows normal sinus rhythm Discussion of management or test interpretation with external provider(s): On recheck, patient resting comfortably.  Tolerating oral fluids and ate a sandwich while here.  Likely acute on chronic hyponatremia, addressed here with IV fluids.  Mildly hypokalemic as well with potassium of 3.2 oral potassium given. Will need hospital admission for his hyponatremia and generalized weakness  Discussed findings with [facility] hospitalist, Dr. Thomes Dinning who agrees to admit  Risk Prescription drug management.           Final Clinical Impression(s) / ED Diagnoses Final diagnoses:  Hyponatremia  Generalized weakness    Rx / DC Orders ED Discharge Orders     None         Rosey Bath 07/16/23 2114    Pricilla Loveless, MD 07/23/23 [phone]

## 2023-07-16 NOTE — H&P (Signed)
History and Physical    Patient: Kenneth Lester ZOX:[id number] DOB: [birth year] DOA: 07/16/2023 DOS: the patient was seen and examined on 07/17/2023 PCP: Carylon Perches, MD  Patient coming from: Home  Chief Complaint:  Chief Complaint  Patient presents with   Dizziness   HPI: Kenneth Lester is a 67 y.o. male with medical history significant of hypertension, chronic hyponatremia, vertigo, alcohol abuse who presents to the emergency department due to several months of onset of generalized weakness and intermittent dizziness.  Dizziness occurs mostly on standing up from seated position and he complains of weakness on standing, patient was concerned that probably his sodium is low again.  He states that he has been compliant with his diuretic (hydrochlorothiazide). Patient was recently admitted from 7/8 to 7/11 due to acute on chronic ambulatory dysfunction which improved with IV hydration. He was recently seen in the ED on 8/13 due to bilateral lower extremity weakness Patient ambulates with a cane at baseline  ED Course:  In the emergency department, BP was 167/84, but other vital signs were within normal range.  Workup in the ED showed normal CBC.  BMP showed sodium 123, potassium 3.2, chloride 87, bicarb 25, blood glucose 97, BUN 7, creatinine 0.66.  Urinalysis was normal.  Urine sodium 33 Potassium was replenished, IV NS 1 L was given.  Hospitalist was asked to admit patient for further evaluation and management.  Review of Systems: Review of systems as noted in the HPI. All other systems reviewed and are negative.   Past Medical History:  Diagnosis Date   Alcohol abuse    Hypertension    Use of cane as ambulatory aid    Vertigo    Past Surgical History:  Procedure Laterality Date   COLONOSCOPY  2009   Dr. Jena Gauss: difficult prep (patient ate solid food during prep). single hyperplastic polyps removed.   MOUTH SURGERY     teeth pulled for dentures, pt unsure of date   ORIF WRIST  FRACTURE Left 09/22/2021   Procedure: OPEN REDUCTION INTERNAL FIXATION (ORIF) WRIST FRACTURE;  Surgeon: Gomez Cleverly, MD;  Location: [facility];  Service: Orthopedics;  Laterality: Left;  with MAC   right ankle surgery  2006   forklift injury    Social History:  reports that he has been smoking cigarettes. He has a 50 pack-year smoking history. He has never used smokeless tobacco. He reports that he does not currently use alcohol after a past usage of about 7.0 standard drinks of alcohol per week. He reports that he does not use drugs.   Allergies  Allergen Reactions   Other Other (See Comments)    Patient states he was allergic to something he was given in an IV at the hospital when being treated for pneumonia, but he doesn't remember what the name of the medication was. 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    Family History  Problem Relation Age of Onset   Colon cancer Neg Hx     Prior to Admission medications   Medication Sig Start Date End Date Taking? Authorizing Provider  amLODipine (NORVASC) 10 MG tablet Take 10 mg by mouth daily.    [provider]  bisacodyl (DULCOLAX) 5 MG EC tablet Take 1 tablet (5 mg total) by mouth 2 (two) times daily. 07/13/23   [name], PA-C  hydrochlorothiazide (HYDRODIURIL) 25 MG tablet Take 25 mg by mouth daily.    [provider]  Lactulose 20 GM/30ML SOLN  Take 30 mLs (20 g total) by mouth daily. 07/10/23   [name], MD  losartan (COZAAR) 50 MG tablet Take 1 tablet by mouth daily. 10/24/18   [provider]  Multiple Vitamin (MULTIVITAMIN WITH MINERALS) TABS tablet Take 1 tablet by mouth daily. 05/04/23   [name], MD    Physical Exam: BP 135/63 (BP Location: Right Arm)   Pulse 63   Temp 98.2 F (36.8 C) (Oral)   Resp 13   Ht 5\' 8"  (1.727 m)   Wt 61 kg   SpO2 96%   BMI 20.45 kg/m   General: 67 y.o. year-old male well developed well nourished in no acute distress.   Alert and oriented x3. HEENT: NCAT, EOMI Neck: Supple, trachea medial Cardiovascular: Regular rate and rhythm with no rubs or gallops.  No thyromegaly or JVD noted.  No lower extremity edema. 2/4 pulses in all 4 extremities. Respiratory: Clear to auscultation with no wheezes or rales. Good inspiratory effort. Abdomen: Soft, nontender nondistended with normal bowel sounds x4 quadrants. Muskuloskeletal: No cyanosis, clubbing or edema noted bilaterally Neuro: CN II-XII intact, strength 5/5 x 4, sensation, reflexes intact Skin: No ulcerative lesions noted or rashes Psychiatry: Judgement and insight appear normal. Mood is appropriate for condition and setting          Labs on Admission:  Basic Metabolic Panel: Recent Labs  Lab 07/10/23 0239 07/13/23 1406 07/16/23 1408  NA 125* 125* 123*  K 3.5 3.5 3.2*  CL 91* 91* 87*  CO2 24 23 25   GLUCOSE 102* 103* 97  BUN 8 7* 7*  CREATININE 0.69 0.67 0.66  CALCIUM 9.2 9.5 9.3   Liver Function Tests: Recent Labs  Lab 07/10/23 0239 07/13/23 1406  AST 18 24  ALT 17 24  ALKPHOS 60 59  BILITOT 0.6 0.6  PROT 7.7 7.9  ALBUMIN 4.4 4.8   Recent Labs  Lab 07/10/23 0239 07/13/23 1406  LIPASE 33 31   No results for input(s): "AMMONIA" in the last 168 hours. CBC: Recent Labs  Lab 07/10/23 0239 07/13/23 1406 07/16/23 1408  WBC 8.1 6.8 6.8  NEUTROABS  --  3.8  --   HGB 13.8 14.0 14.1  HCT 38.6* 39.3 39.1  MCV 88.5 88.7 87.9  PLT 259 267 286   Cardiac Enzymes: No results for input(s): "CKTOTAL", "CKMB", "CKMBINDEX", "TROPONINI" in the last 168 hours.  BNP (last 3 results) No results for input(s): "BNP" in the last 8760 hours.  ProBNP (last 3 results) No results for input(s): "PROBNP" in the last 8760 hours.  CBG: Recent Labs  Lab 07/16/23 1347  GLUCAP 95    Radiological Exams on Admission: No results found.  EKG: I independently viewed the EKG done and my findings are as followed:  Normal sinus rhythm at a rate of 62  bpm  Assessment/Plan Present on Admission:  Hyponatremia  Hypokalemia  History of alcohol abuse  Tobacco use disorder  Essential hypertension  Principal Problem:   Hyponatremia Active Problems:   Tobacco use disorder   Hypokalemia   Essential hypertension   Dizziness   History of alcohol abuse   Generalized weakness  Acute on chronic hyponatremia Na 123 This may be due to diuretic effect, HCTZ will be held at this time Continue gentle hydration Continue to monitor sodium with serial BMPs Urine osmolality, serum osmolality and urine sodium will be checked  Dizziness This may be secondary to above Continue treatment as described above  Hypokalemia K+ is 3.2 K+ will  be replenished Please monitor for AM K+ for further replenishmemnt  Generalized weakness Patient complained of chronic leg weakness Continue fall precaution Continue PT/OT eval and treat  Essential hypertension Continue amlodipine, losartan  History of alcohol abuse Patient shows no sign of alcohol withdrawal Continue to monitor and consider starting patient on CIWA protocol on showing signs of withdrawal  Tobacco use disorder Patient was counseled on tobacco abuse cessation Continue nicotine patch  DVT prophylaxis: Lovenox  Advance Care Planning: CODE STATUS: Full code  Consults: None  Family Communication: None at bedside  Severity of Illness: The appropriate patient status for this patient is INPATIENT. Inpatient status is judged to be reasonable and necessary in order to provide the required intensity of service to ensure the patient's safety. The patient's presenting symptoms, physical exam findings, and initial radiographic and laboratory data in the context of their chronic comorbidities is felt to place them at high risk for further clinical deterioration. Furthermore, it is not anticipated that the patient will be medically stable for discharge from the hospital within 2 midnights of  admission.   * I certify that at the point of admission it is my clinical judgment that the patient will require inpatient hospital care spanning beyond 2 midnights from the point of admission due to high intensity of service, high risk for further deterioration and high frequency of surveillance required.*  Author: Frankey Shown, DO 07/17/2023 12:06 AM  For on call review [email or web address].

## 2023-07-16 NOTE — H&P (Incomplete)
History and Physical    Patient: Kenneth Lester ZOX:[id number] DOB: [birth year] DOA: 07/16/2023 DOS: the patient was seen and examined on 07/16/2023 PCP: Kenneth Perches, MD  Patient coming from: Home  Chief Complaint:  Chief Complaint  Patient presents with  . Dizziness   HPI: Kenneth Lester is a 67 y.o. male with medical history significant of hypertension, chronic hyponatremia, vertigo, alcohol abuse who presents to the emergency department due to several months of onset of generalized weakness and intermittent dizziness.  Dizziness occurs mostly on standing up from seated position and he complains of weakness on standing, patient was concerned that probably his sodium is low again.  He states that he has been compliant with his diuretic (hydrochlorothiazide). Patient was recently admitted from 7/8 to 7/11 due to acute on chronic ambulatory dysfunction which improved with IV hydration. He was recently seen in the ED on 8/13 due to bilateral lower extremity weakness Patient ambulates with a cane at baseline  ED Course:  In the emergency department, BP was 167/84, but other vital signs were within normal range.  Workup in the ED showed normal CBC.  BMP showed sodium 123, potassium 3.2, chloride 87, bicarb 25, blood glucose 97, BUN 7, creatinine 0.66.  Urinalysis was normal.  Urine sodium 33 Potassium was replenished, IV NS 1 L was given.  Hospitalist was asked to admit patient for further evaluation and management.  Review of Systems: Review of systems as noted in the HPI. All other systems reviewed and are negative.   Past Medical History:  Diagnosis Date  . Alcohol abuse   . Hypertension   . Use of cane as ambulatory aid   . Vertigo    Past Surgical History:  Procedure Laterality Date  . COLONOSCOPY  2009   Dr. Jena Lester: difficult prep (patient ate solid food during prep). single hyperplastic polyps removed.  Marland Kitchen MOUTH SURGERY     teeth pulled for dentures, pt unsure of date  . ORIF  WRIST FRACTURE Left 09/22/2021   Procedure: OPEN REDUCTION INTERNAL FIXATION (ORIF) WRIST FRACTURE;  Surgeon: Kenneth Cleverly, MD;  Location: [facility];  Service: Orthopedics;  Laterality: Left;  with MAC  . right ankle surgery  2006   forklift injury    Social History:  reports that he has been smoking cigarettes. He has a 50 pack-year smoking history. He has never used smokeless tobacco. He reports that he does not currently use alcohol after a past usage of about 7.0 standard drinks of alcohol per week. He reports that he does not use drugs.   Allergies  Allergen Reactions  . Other Other (See Comments)    Patient states he was allergic to something he was given in an IV at the hospital when being treated for pneumonia, but he doesn't remember what the name of the medication was. 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    Family History  Problem Relation Age of Onset  . Colon cancer Neg Hx     ***  Prior to Admission medications   Medication Sig Start Date End Date Taking? Authorizing Provider  amLODipine (NORVASC) 10 MG tablet Take 10 mg by mouth daily.    [provider]  bisacodyl (DULCOLAX) 5 MG EC tablet Take 1 tablet (5 mg total) by mouth 2 (two) times daily. 07/13/23   Kenneth Knudsen, PA-C  hydrochlorothiazide (HYDRODIURIL) 25 MG tablet Take 25 mg by mouth daily.    [provider]  Lactulose 20  GM/30ML SOLN Take 30 mLs (20 g total) by mouth daily. 07/10/23   Kenneth Rhine, MD  losartan (COZAAR) 50 MG tablet Take 1 tablet by mouth daily. 10/24/18   [provider]  Multiple Vitamin (MULTIVITAMIN WITH MINERALS) TABS tablet Take 1 tablet by mouth daily. 05/04/23   Kenneth Fallen, MD    Physical Exam: BP 128/72   Pulse 70   Temp 98.3 F (36.8 C) (Oral)   Resp 14   Ht 5\' 8"  (1.727 m)   Wt 61 kg   SpO2 96%   BMI 20.45 kg/m   General: 67 y.o. year-old male well developed well nourished in no acute distress.  Alert and  oriented x3. HEENT: NCAT, EOMI Neck: Supple, trachea medial Cardiovascular: Regular rate and rhythm with no rubs or gallops.  No thyromegaly or JVD noted.  No lower extremity edema. 2/4 pulses in all 4 extremities. Respiratory: Clear to auscultation with no wheezes or rales. Good inspiratory effort. Abdomen: Soft, nontender nondistended with normal bowel sounds x4 quadrants. Muskuloskeletal: No cyanosis, clubbing or edema noted bilaterally Neuro: CN II-XII intact, strength 5/5 x 4, sensation, reflexes intact Skin: No ulcerative lesions noted or rashes Psychiatry: Judgement and insight appear normal. Mood is appropriate for condition and setting          Labs on Admission:  Basic Metabolic Panel: Recent Labs  Lab 07/10/23 0239 07/13/23 1406 07/16/23 1408  NA 125* 125* 123*  K 3.5 3.5 3.2*  CL 91* 91* 87*  CO2 24 23 25   GLUCOSE 102* 103* 97  BUN 8 7* 7*  CREATININE 0.69 0.67 0.66  CALCIUM 9.2 9.5 9.3   Liver Function Tests: Recent Labs  Lab 07/10/23 0239 07/13/23 1406  AST 18 24  ALT 17 24  ALKPHOS 60 59  BILITOT 0.6 0.6  PROT 7.7 7.9  ALBUMIN 4.4 4.8   Recent Labs  Lab 07/10/23 0239 07/13/23 1406  LIPASE 33 31   No results for input(s): "AMMONIA" in the last 168 hours. CBC: Recent Labs  Lab 07/10/23 0239 07/13/23 1406 07/16/23 1408  WBC 8.1 6.8 6.8  NEUTROABS  --  3.8  --   HGB 13.8 14.0 14.1  HCT 38.6* 39.3 39.1  MCV 88.5 88.7 87.9  PLT 259 267 286   Cardiac Enzymes: No results for input(s): "CKTOTAL", "CKMB", "CKMBINDEX", "TROPONINI" in the last 168 hours.  BNP (last 3 results) No results for input(s): "BNP" in the last 8760 hours.  ProBNP (last 3 results) No results for input(s): "PROBNP" in the last 8760 hours.  CBG: Recent Labs  Lab 07/16/23 1347  GLUCAP 95    Radiological Exams on Admission: No results found.  EKG: I independently viewed the EKG done and my findings are as followed:  Normal sinus rhythm at a rate of 62  bpm  Assessment/Plan Present on Admission: . Hyponatremia  Principal Problem:   Hyponatremia  Acute on chronic hyponatremia Na 123 Orthostatic BP will be checked to rule out postural hypotension Continue gentle hydration Continue to monitor sodium with serial BMPs Urine osmolality, serum osmolality and urine sodium will be checked  Dizziness This may be secondary to above Continue treatment as described above  Hypokalemia K+ is 3.2 K+ will be replenished Please monitor for AM K+ for further replenishmemnt  Generalized weakness Patient complained of chronic leg weakness Continue fall precaution Continue PT/OT eval and treat  Essential hypertension Continue amlodipine, losartan  History of alcohol abuse Patient shows no sign of alcohol withdrawal Continue to  monitor and consider starting patient on CIWA protocol on showing signs of withdrawal  Tobacco use disorder Patient was counseled on tobacco abuse cessation Continue nicotine patch  DVT prophylaxis: ***   Code Status: ***   Family Communication: ***   Disposition Plan: ***   Consults called: ***   Admission status: ***     Frankey Shown MD [facility] Pager [phone]  If 7PM-7AM, please contact night-coverage [email or web address] Password [email or web address]  07/16/2023, 11:30 PM         Review of Systems: {ROS_Text:26778} Past Medical History:  Diagnosis Date  . Alcohol abuse   . Hypertension   . Use of cane as ambulatory aid   . Vertigo    Past Surgical History:  Procedure Laterality Date  . COLONOSCOPY  2009   Dr. Jena Lester: difficult prep (patient ate solid food during prep). single hyperplastic polyps removed.  Marland Kitchen MOUTH SURGERY     teeth pulled for dentures, pt unsure of date  . ORIF WRIST FRACTURE Left 09/22/2021   Procedure: OPEN REDUCTION INTERNAL FIXATION (ORIF) WRIST FRACTURE;  Surgeon: Kenneth Cleverly, MD;  Location: [facility];  Service: Orthopedics;  Laterality: Left;   with MAC  . right ankle surgery  2006   forklift injury   Social History:  reports that he has been smoking cigarettes. He has a 50 pack-year smoking history. He has never used smokeless tobacco. He reports that he does not currently use alcohol after a past usage of about 7.0 standard drinks of alcohol per week. He reports that he does not use drugs.  Allergies  Allergen Reactions  . Other Other (See Comments)    Patient states he was allergic to something he was given in an IV at the hospital when being treated for pneumonia, but he doesn't remember what the name of the medication was. 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    Family History  Problem Relation Age of Onset  . Colon cancer Neg Hx     Prior to Admission medications   Medication Sig Start Date End Date Taking? Authorizing Provider  amLODipine (NORVASC) 10 MG tablet Take 10 mg by mouth daily.    [provider]  bisacodyl (DULCOLAX) 5 MG EC tablet Take 1 tablet (5 mg total) by mouth 2 (two) times daily. 07/13/23   Kenneth Knudsen, PA-C  hydrochlorothiazide (HYDRODIURIL) 25 MG tablet Take 25 mg by mouth daily.    [provider]  Lactulose 20 GM/30ML SOLN Take 30 mLs (20 g total) by mouth daily. 07/10/23   Kenneth Rhine, MD  losartan (COZAAR) 50 MG tablet Take 1 tablet by mouth daily. 10/24/18   [provider]  Multiple Vitamin (MULTIVITAMIN WITH MINERALS) TABS tablet Take 1 tablet by mouth daily. 05/04/23   Kenneth Fallen, MD    Physical Exam: Vitals:   07/16/23 1645 07/16/23 1700 07/16/23 1723 07/16/23 1959  BP: (!) 176/76 (!) 167/84  (!) 113/57  Pulse: 66 81  62  Resp: 18   18  Temp:   98 F (36.7 C) 97.9 F (36.6 C)  TempSrc:   Oral Oral  SpO2: 100% 99%  98%  Weight:      Height:       *** Data Reviewed: {Tip this will not be part of the note when signed- Document your independent interpretation of telemetry tracing, EKG, lab, Radiology test or any other diagnostic  tests. Add any new diagnostic test ordered today. (Optional):26781} {Results:26384}  Assessment and  Plan: No notes have been filed under this hospital service. Service: Hospitalist     Advance Care Planning:   Code Status: Prior ***  Consults: ***  Family Communication: ***  Severity of Illness: {Observation/Inpatient:21159}  Author: Frankey Shown, DO 07/16/2023 9:13 PM  For on call review [email or web address].

## 2023-07-16 NOTE — ED Triage Notes (Signed)
Pt BIB EMS for evaluation of dizziness, per pt this has been going on for months, when he goes from sitting to standing position, he gets dizzy.

## 2023-07-17 DIAGNOSIS — E871 Hypo-osmolality and hyponatremia: Secondary | ICD-10-CM | POA: Diagnosis not present

## 2023-07-17 DIAGNOSIS — R531 Weakness: Secondary | ICD-10-CM

## 2023-07-17 LAB — BASIC METABOLIC PANEL
Anion gap: 6 (ref 5–15)
Anion gap: 8 (ref 5–15)
BUN: 7 mg/dL — ABNORMAL LOW (ref 8–23)
BUN: 8 mg/dL (ref 8–23)
CO2: 23 mmol/L (ref 22–32)
CO2: 20 mmol/L — ABNORMAL LOW (ref 22–32)
Calcium: 8.8 mg/dL — ABNORMAL LOW (ref 8.9–10.3)
Calcium: 8.5 mg/dL — ABNORMAL LOW (ref 8.9–10.3)
Chloride: 100 mmol/L (ref 98–111)
Chloride: 101 mmol/L (ref 98–111)
Creatinine, Ser: 0.53 mg/dL — ABNORMAL LOW (ref 0.61–1.24)
Creatinine, Ser: 0.64 mg/dL (ref 0.61–1.24)
GFR, Estimated: >60 mL/min (ref >60)
GFR, Estimated: >60 mL/min (ref >60)
Glucose, Bld: 122 mg/dL — ABNORMAL HIGH (ref 70–99)
Glucose, Bld: 109 mg/dL — ABNORMAL HIGH (ref 70–99)
Potassium: 3.9 mmol/L (ref 3.5–5.1)
Potassium: 3.8 mmol/L (ref 3.5–5.1)
Sodium: 129 mmol/L — ABNORMAL LOW (ref 135–145)
Sodium: 129 mmol/L — ABNORMAL LOW (ref 135–145)

## 2023-07-17 LAB — CBC
HCT: 35.4 % — ABNORMAL LOW (ref 39.0–52.0)
Hemoglobin: 12.1 g/dL — ABNORMAL LOW (ref 13.0–17.0)
MCH: 30.5 pg (ref 26.0–34.0)
MCHC: 34.2 g/dL (ref 30.0–36.0)
MCV: 89.2 fL (ref 80.0–100.0)
Platelets: 250 10*3/uL (ref 150–400)
RBC: 3.97 MIL/uL — ABNORMAL LOW (ref 4.22–5.81)
RDW: 12.7 % (ref 11.5–15.5)
WBC: 5.9 10*3/uL (ref 4.0–10.5)
nRBC: 0 % (ref 0.0–0.2)

## 2023-07-17 LAB — COMPREHENSIVE METABOLIC PANEL
ALT: 16 U/L (ref 0–44)
AST: 17 U/L (ref 15–41)
Albumin: 3.5 g/dL (ref 3.5–5.0)
Alkaline Phosphatase: 44 U/L (ref 38–126)
Anion gap: 7 (ref 5–15)
BUN: 8 mg/dL (ref 8–23)
CO2: 23 mmol/L (ref 22–32)
Calcium: 8.6 mg/dL — ABNORMAL LOW (ref 8.9–10.3)
Chloride: 98 mmol/L (ref 98–111)
Creatinine, Ser: 0.56 mg/dL — ABNORMAL LOW (ref 0.61–1.24)
GFR, Estimated: >60 mL/min (ref >60)
Glucose, Bld: 86 mg/dL (ref 70–99)
Potassium: 3.6 mmol/L (ref 3.5–5.1)
Sodium: 128 mmol/L — ABNORMAL LOW (ref 135–145)
Total Bilirubin: 0.6 mg/dL (ref 0.3–1.2)
Total Protein: 6.2 g/dL — ABNORMAL LOW (ref 6.5–8.1)

## 2023-07-17 LAB — FOLATE: Folate: 9.9 ng/mL (ref >5.9)

## 2023-07-17 LAB — VITAMIN D 25 HYDROXY (VIT D DEFICIENCY, FRACTURES): Vit D, 25-Hydroxy: 19.39 ng/mL — ABNORMAL LOW (ref 30–100)

## 2023-07-17 LAB — IRON AND TIBC
Iron: 90 ug/dL (ref 45–182)
Saturation Ratios: 34 % (ref 17.9–39.5)
TIBC: 266 ug/dL (ref 250–450)
UIBC: 176 ug/dL

## 2023-07-17 LAB — OSMOLALITY, URINE: Osmolality, Ur: 350 mOsm/kg (ref 300–900)

## 2023-07-17 LAB — VITAMIN B12: Vitamin B-12: 211 pg/mL (ref 180–914)

## 2023-07-17 LAB — OSMOLALITY
Osmolality: 267 mOsm/kg — ABNORMAL LOW (ref 275–295)
Osmolality: 281 mOsm/kg (ref 275–295)

## 2023-07-17 LAB — PHOSPHORUS: Phosphorus: 3.5 mg/dL (ref 2.5–4.6)

## 2023-07-17 LAB — MRSA NEXT GEN BY PCR, NASAL: MRSA by PCR Next Gen: NOT DETECTED

## 2023-07-17 LAB — MAGNESIUM: Magnesium: 2 mg/dL (ref 1.7–2.4)

## 2023-07-17 MED ORDER — LOSARTAN POTASSIUM 50 MG PO TABS: 50.0000 mg | ORAL_TABLET | Freq: Every day | ORAL | Status: DC

## 2023-07-17 MED ORDER — ONDANSETRON HCL 4 MG PO TABS: 4.0000 mg | ORAL_TABLET | Freq: Four times a day (QID) | ORAL | Status: DC | PRN

## 2023-07-17 MED ORDER — ENOXAPARIN SODIUM 40 MG/0.4ML IJ SOSY
40.0000 mg | PREFILLED_SYRINGE | INTRAMUSCULAR | Status: DC
  Administered 2023-07-17 – 2023-07-19 (×2): 40 mg via SUBCUTANEOUS
  Filled 2023-07-17 (×3): qty 0.4

## 2023-07-17 MED ORDER — ACETAMINOPHEN 650 MG RE SUPP: 650.0000 mg | Freq: Four times a day (QID) | RECTAL | Status: DC | PRN

## 2023-07-17 MED ORDER — AMLODIPINE BESYLATE 5 MG PO TABS: 10.0000 mg | ORAL_TABLET | Freq: Every day | ORAL | Status: DC

## 2023-07-17 MED ORDER — ONDANSETRON HCL 4 MG/2ML IJ SOLN: 4.0000 mg | Freq: Four times a day (QID) | INTRAMUSCULAR | Status: DC | PRN

## 2023-07-17 MED ORDER — CYANOCOBALAMIN 1000 MCG/ML IJ SOLN
1000.0000 ug | Freq: Every day | INTRAMUSCULAR | Status: DC
  Administered 2023-07-17: 1000 ug via INTRAMUSCULAR
  Filled 2023-07-17 (×2): qty 1

## 2023-07-17 MED ORDER — ACETAMINOPHEN 325 MG PO TABS: 650.0000 mg | ORAL_TABLET | Freq: Four times a day (QID) | ORAL | Status: DC | PRN

## 2023-07-17 MED ORDER — NICOTINE 21 MG/24HR TD PT24
21.0000 mg | MEDICATED_PATCH | Freq: Every day | TRANSDERMAL | Status: DC
  Filled 2023-07-17 (×2): qty 1

## 2023-07-17 MED ORDER — HYDRALAZINE HCL 25 MG PO TABS: 50.0000 mg | ORAL_TABLET | Freq: Three times a day (TID) | ORAL | Status: DC | PRN

## 2023-07-17 MED ORDER — AMLODIPINE BESYLATE 5 MG PO TABS
5.0000 mg | ORAL_TABLET | Freq: Every day | ORAL | Status: DC
  Administered 2023-07-18: 5 mg via ORAL
  Filled 2023-07-17: qty 1

## 2023-07-17 MED ORDER — VITAMIN B-12 1000 MCG PO TABS: 1000.0000 ug | ORAL_TABLET | Freq: Every day | ORAL | Status: DC

## 2023-07-17 MED ORDER — LOSARTAN POTASSIUM 50 MG PO TABS
25.0000 mg | ORAL_TABLET | Freq: Every day | ORAL | Status: DC
  Administered 2023-07-18 – 2023-07-19 (×2): 25 mg via ORAL
  Filled 2023-07-17 (×2): qty 1

## 2023-07-17 MED ORDER — SODIUM CHLORIDE 0.9 % IV SOLN: INTRAVENOUS | Status: AC

## 2023-07-17 NOTE — [place] FL2 (Signed)
[place] MEDICAID FL2 LEVEL OF CARE FORM     IDENTIFICATION  Patient Name: Kenneth Lester Birthdate: 10-23-1956 Sex: male Admission Date (Current Location): 07/16/2023  [place] and [place] Number:  [employer] and Address:  [facility],  [address], [place] [address]      Provider Number: [id number]  Attending Physician Name and Address:  Gillis Santa, MD  Relative Name and Phone Number:       Current Level of Care:   Recommended Level of Care:   Prior Approval Number:    Date Approved/Denied:   PASRR Number: [id number]  Discharge Plan: SNF    Current Diagnoses: Patient Active Problem List   Diagnosis Date Noted   Generalized weakness 07/17/2023   Acute hyponatremia 05/01/2023   Tobacco use disorder 05/01/2023   Hypokalemia 05/01/2023   Essential hypertension 05/01/2023   Dizziness 05/01/2023   History of alcohol abuse 05/01/2023   Hyponatremia 05/01/2023   Colon cancer screening 12/31/2018   Clavicle fracture 01/15/2014   Closed fracture of acromial end of clavicle 12/22/2013    Orientation RESPIRATION BLADDER Height & Weight     Self, Time, Situation, Place  Normal Continent Weight: 59.9 kg Height:  5\' 8"  (172.7 cm)  BEHAVIORAL SYMPTOMS/MOOD NEUROLOGICAL BOWEL NUTRITION STATUS      Continent Diet (See DC Summary)  AMBULATORY STATUS COMMUNICATION OF NEEDS Skin   Extensive Assist Verbally Normal                       Personal Care Assistance Level of Assistance  Bathing, Feeding, Dressing Bathing Assistance: Maximum assistance Feeding assistance: Independent Dressing Assistance: Limited assistance     Functional Limitations Info  Sight, Hearing, Speech Sight Info: Impaired Hearing Info: Adequate Speech Info: Adequate    SPECIAL CARE FACTORS FREQUENCY  PT (By licensed PT)     PT Frequency: 5 times a week              Contractures Contractures Info: Not present    Additional Factors Info  Code  Status, Allergies Code Status Info: FULL Allergies Info: Not specified           Current Medications (07/17/2023):  This is the current hospital active medication list Current Facility-Administered Medications  Medication Dose Route Frequency Provider Last Rate Last Admin   0.9 %  sodium chloride infusion   Intravenous Continuous Gillis Santa, MD 75 mL/hr at 07/17/23 [phone] Other (enter comment in med admin window) at 07/17/23 0918   acetaminophen (TYLENOL) tablet 650 mg  650 mg Oral Q6H PRN Frankey Shown, DO       Or   acetaminophen (TYLENOL) suppository 650 mg  650 mg Rectal Q6H PRN Adefeso, Oladapo, DO       amLODipine (NORVASC) tablet 5 mg  5 mg Oral QHS Gillis Santa, MD       Chlorhexidine Gluconate Cloth 2 % PADS 6 each  6 each Topical Daily Adefeso, Oladapo, DO   6 each at 07/17/23 0950   cyanocobalamin (VITAMIN B12) injection 1,000 mcg  1,000 mcg Intramuscular Daily Gillis Santa, MD       Followed by   Melene Muller ON 07/24/2023] cyanocobalamin (VITAMIN B12) tablet 1,000 mcg  1,000 mcg Oral Daily Gillis Santa, MD       enoxaparin (LOVENOX) injection 40 mg  40 mg Subcutaneous Q24H Adefeso, Oladapo, DO   40 mg at 07/17/23 0948   hydrALAZINE (APRESOLINE) tablet 50 mg  50 mg Oral Q8H PRN  Gillis Santa, MD       influenza vaccine adjuvanted (FLUAD) injection 0.5 mL  0.5 mL Intramuscular Tomorrow-1000 Adefeso, Oladapo, DO       [START ON 07/18/2023] losartan (COZAAR) tablet 25 mg  25 mg Oral Daily Gillis Santa, MD       nicotine (NICODERM CQ - dosed in mg/24 hours) patch 21 mg  21 mg Transdermal Daily Adefeso, Oladapo, DO       ondansetron (ZOFRAN) tablet 4 mg  4 mg Oral Q6H PRN Adefeso, Oladapo, DO       Or   ondansetron (ZOFRAN) injection 4 mg  4 mg Intravenous Q6H PRN Adefeso, Oladapo, DO       Oral care mouth rinse  15 mL Mouth Rinse PRN Frankey Shown, DO         Discharge Medications: Please see discharge summary for a list of discharge medications.  Relevant Imaging  Results:  Relevant Lab Results:   Additional Information SS# [id number]  Leitha Bleak, RN

## 2023-07-17 NOTE — Plan of Care (Signed)
  Problem: Acute Rehab PT Goals(only PT should resolve) Goal: Pt Will Go Supine/Side To Sit Outcome: Progressing Flowsheets (Taken 07/17/2023 1156) Pt will go Supine/Side to Sit:  with modified independence  with supervision Goal: Patient Will Transfer Sit To/From Stand Outcome: Progressing Flowsheets (Taken 07/17/2023 1156) Patient will transfer sit to/from stand: with supervision Goal: Pt Will Transfer Bed To Chair/Chair To Bed Outcome: Progressing Flowsheets (Taken 07/17/2023 1156) Pt will Transfer Bed to Chair/Chair to Bed: with supervision Goal: Pt Will Ambulate Outcome: Progressing Flowsheets (Taken 07/17/2023 1156) Pt will Ambulate:  50 feet  with supervision  with cane  with rolling walker   11:57 AM, 07/17/23 Ocie Bob, MPT Physical Therapist with [facility] [phone] office [phone] mobile phone

## 2023-07-17 NOTE — Evaluation (Signed)
Occupational Therapy Evaluation Patient Details Name: Kenneth Lester MRN: [id number] DOB: 14-Aug-1956 Today's Date: 07/17/2023   History of Present Illness Kenneth Lester is a 67 y.o. male with medical history significant of hypertension, chronic hyponatremia, vertigo, alcohol abuse who presents to the emergency department due to several months of onset of generalized weakness and intermittent dizziness.  Dizziness occurs mostly on standing up from seated position and he complains of weakness on standing, patient was concerned that probably his sodium is low again.  He states that he has been compliant with his diuretic (hydrochlorothiazide).  Patient was recently admitted from 7/8 to 7/11 due to acute on chronic ambulatory dysfunction which improved with IV hydration.  He was recently seen in the ED on 8/13 due to bilateral lower extremity weakness  Patient ambulates with a cane at baseline (per DO)   Clinical Impression   Pt agreeable to OT and PT co-evaluation. Pt reports living with wife who helps with IADL's while pt is independent for ADL's. Today pt was unsteady in standing needing CGA to min A for transfer to toilet and then to chair. Pt used his cane while intermittently leaning on the RW as well. Pt reports feeling unsafe if going home in his current condition. Pt is a high fall risk at this time. Seated ADL's are done well, but standing and ambulating tasks require some assist. Pt was left in the chair with call bell within reach. Pt will benefit from continued OT in the hospital and recommended venue below to increase strength, balance, and endurance for safe ADL's.          If plan is discharge home, recommend the following: A little help with walking and/or transfers;A little help with bathing/dressing/bathroom;Assistance with cooking/housework;Help with stairs or ramp for entrance;Assist for transportation    Functional Status Assessment  Patient has had a recent decline in their  functional status and demonstrates the ability to make significant improvements in function in a reasonable and predictable amount of time.  Equipment Recommendations  None recommended by OT    Recommendations for Other Services       Precautions / Restrictions Precautions Precautions: Fall Restrictions Weight Bearing Restrictions: No      Mobility Bed Mobility Overal bed mobility: Needs Assistance Bed Mobility: Supine to Sit     Supine to sit: Supervision     General bed mobility comments: Mild labored effort; no physical assist needed.    Transfers Overall transfer level: Needs assistance Equipment used: Rolling walker (2 wheels), Straight cane Transfers: Sit to/from Stand, Bed to chair/wheelchair/BSC Sit to Stand: Contact guard assist, Min assist     Step pivot transfers: Contact guard assist, Min assist     General transfer comment: Unsteady needing cane for balance as well as leaning on RW and IV pole.      Balance Overall balance assessment: Needs assistance Sitting-balance support: No upper extremity supported, Feet supported Sitting balance-Leahy Scale: Good Sitting balance - Comments: seated at EOB   Standing balance support: During functional activity, Single extremity supported Standing balance-Leahy Scale: Fair Standing balance comment: poor to fair with cane and intermitent leaning on RW                           ADL either performed or assessed with clinical judgement   ADL Overall ADL's : Needs assistance/impaired     Grooming: Minimal assistance;Contact guard assist;Standing   Upper Body Bathing: Set up;Sitting  Lower Body Bathing: Set up;Sitting/lateral leans   Upper Body Dressing : Set up;Sitting   Lower Body Dressing: Set up;Sitting/lateral leans Lower Body Dressing Details (indicate cue type and reason): Able to doff and don sock seated in chair with leaning. Toilet Transfer: Minimal assistance;Ambulation;Rolling walker  (2 wheels);Contact guard assist (cane) Toilet Transfer Details (indicate cue type and reason): Pt leaning on cane and one side of RW but refused to use RW completly while ambulating to the toilet. Unsteady with extended time needed. Toileting- Clothing Manipulation and Hygiene: Set up;Sitting/lateral lean       Functional mobility during ADLs: Minimal assistance;Contact guard assist;Rolling walker (2 wheels);Cane General ADL Comments: Able to ambulate to toilet and then to chair.     Vision Baseline Vision/History: 1 Wears glasses Ability to See in Adequate Light: 1 Impaired Patient Visual Report: No change from baseline Vision Assessment?: No apparent visual deficits     Perception Perception: Not tested       Praxis Praxis: Not tested       Pertinent Vitals/Pain Pain Assessment Pain Assessment: Faces Faces Pain Scale: No hurt     Extremity/Trunk Assessment Upper Extremity Assessment Upper Extremity Assessment: Generalized weakness   Lower Extremity Assessment Lower Extremity Assessment: Defer to PT evaluation   Cervical / Trunk Assessment Cervical / Trunk Assessment: Normal   Communication Communication Communication: No apparent difficulties   Cognition Arousal: Alert, Lethargic Behavior During Therapy: WFL for tasks assessed/performed Overall Cognitive Status: Within Functional Limits for tasks assessed                                                        Home Living Family/patient expects to be discharged to:: Private residence Living Arrangements: Spouse/significant other Available Help at Discharge: Family;Available PRN/intermittently Type of Home: House Home Access: Stairs to enter [employer] of Steps: 3-4 Entrance Stairs-Rails: Right;Left (too wide to reach both) Home Layout: One level     Bathroom Shower/Tub: Producer, television/film/video: Standard Bathroom Accessibility: Yes How Accessible: Accessible  via walker Home Equipment: Cane - single point;Shower seat;Grab bars - tub/shower          Prior Functioning/Environment Prior Level of Function : Needs assist       Physical Assist : ADLs (physical)   ADLs (physical): IADLs Mobility Comments: household and short distanced community ambulator using [facility] ADLs Comments: Independent ADL assist IADL by wife        OT Problem List: Decreased strength;Decreased activity tolerance;Impaired balance (sitting and/or standing)      OT Treatment/Interventions: Self-care/ADL training;Therapeutic exercise;Patient/family education;Balance training;Therapeutic activities    OT Goals(Current goals can be found in the care plan section) Acute Rehab OT Goals Patient Stated Goal: improve function prior to return home OT Goal Formulation: With patient Time For Goal Achievement: 07/31/23 Potential to Achieve Goals: Good  OT Frequency: Min 2X/week    Co-evaluation PT/OT/SLP Co-Evaluation/Treatment: Yes Reason for Co-Treatment: To address functional/ADL transfers   OT goals addressed during session: ADL's and self-care                       End of Session Equipment Utilized During Treatment: Rolling walker (2 wheels) (cane) Nurse Communication: Mobility status  Activity Tolerance: Patient tolerated treatment well Patient left: in chair;with call bell/phone within reach  OT Visit  Diagnosis: Unsteadiness on feet (R26.81);Other abnormalities of gait and mobility (R26.89);Muscle weakness (generalized) (M62.81)                Time: 7253-6644 OT Time Calculation (min): 20 min Charges:  OT General Charges $OT Visit: 1 Visit OT Evaluation $OT Eval Low Complexity: 1 Low  Jhoanna Heyde OT, MOT  Danie Chandler 07/17/2023, 9:16 AM

## 2023-07-17 NOTE — Evaluation (Signed)
Physical Therapy Evaluation Patient Details Name: Kenneth Lester MRN: [id number] DOB: 04/04/1956 Today's Date: 07/17/2023  History of Present Illness  [name] is a 67 y.o. male with medical history significant of hypertension, chronic hyponatremia, vertigo, alcohol abuse who presents to the emergency department due to several months of onset of generalized weakness and intermittent dizziness.  Dizziness occurs mostly on standing up from seated position and he complains of weakness on standing, patient was concerned that probably his sodium is low again.  He states that he has been compliant with his diuretic (hydrochlorothiazide).  Patient was recently admitted from 7/8 to 7/11 due to acute on chronic ambulatory dysfunction which improved with IV hydration.  He was recently seen in the ED on 8/13 due to bilateral lower extremity weakness  Patient ambulates with a cane at baseline   Clinical Impression  Patient demonstrates labored movement for sitting up at bedside, unsteady on feet using SPC having to lean on nearby objects for support and limited to taking steps at bedside due to c/o fatigue/weakness.  Patient tolerated sitting up in chair after therapy.  Patient will benefit from continued skilled physical therapy in hospital and recommended venue below to increase strength, balance, endurance for safe ADLs and gait.          If plan is discharge home, recommend the following: A little help with bathing/dressing/bathroom;Help with stairs or ramp for entrance;Assistance with cooking/housework;A lot of help with walking and/or transfers   Can travel by private vehicle   Yes    Equipment Recommendations None recommended by PT  Recommendations for Other Services       Functional Status Assessment Patient has had a recent decline in their functional status and demonstrates the ability to make significant improvements in function in a reasonable and predictable amount of time.      Precautions / Restrictions Precautions Precautions: Fall Restrictions Weight Bearing Restrictions: No      Mobility  Bed Mobility Overal bed mobility: Needs Assistance Bed Mobility: Supine to Sit     Supine to sit: Supervision     General bed mobility comments: increased time, labored movement    Transfers Overall transfer level: Needs assistance Equipment used: Rolling walker (2 wheels), Straight cane Transfers: Sit to/from Stand, Bed to chair/wheelchair/BSC Sit to Stand: Contact guard assist, Min assist   Step pivot transfers: Contact guard assist, Min assist       General transfer comment: had to lean on nearby objects for support when using [facility]    Ambulation/Gait Ambulation/Gait assistance: Min assist Gait Distance (Feet): 10 Feet Assistive device: Straight cane Gait Pattern/deviations: Decreased step length - right, Decreased step length - left, Decreased stride length, Step-to pattern Gait velocity: slow     General Gait Details: limited to a few slow labored steps at bedside using SPC, but has to lean on nearby objects for support, declined to use RW  Stairs            Wheelchair Mobility     Tilt Bed    Modified Rankin (Stroke Patients Only)       Balance Overall balance assessment: Needs assistance Sitting-balance support: Feet supported, No upper extremity supported Sitting balance-Leahy Scale: Good Sitting balance - Comments: seated at EOB   Standing balance support: During functional activity, Single extremity supported Standing balance-Leahy Scale: Poor Standing balance comment: fair/poor using SPC  Pertinent Vitals/Pain Pain Assessment Pain Assessment: Faces Faces Pain Scale: No hurt    Home Living Family/patient expects to be discharged to:: Private residence Living Arrangements: Spouse/significant other Available Help at Discharge: Family;Available PRN/intermittently Type of Home:  House Home Access: Stairs to enter Entrance Stairs-Rails: Doctor, general practice of Steps: 3-4   Home Layout: One level Home Equipment: Cane - single point;Shower seat;Grab bars - tub/shower      Prior Function Prior Level of Function : Needs assist       Physical Assist : ADLs (physical);Mobility (physical) Mobility (physical): Bed mobility;Transfers;Gait;Stairs ADLs (physical): IADLs Mobility Comments: household and short distanced community ambulator using [facility] ADLs Comments: Independent ADL assist IADL by wife     Extremity/Trunk Assessment   Upper Extremity Assessment Upper Extremity Assessment: Defer to OT evaluation    Lower Extremity Assessment Lower Extremity Assessment: Generalized weakness    Cervical / Trunk Assessment Cervical / Trunk Assessment: Normal  Communication   Communication Communication: No apparent difficulties  Cognition Arousal: Alert Behavior During Therapy: WFL for tasks assessed/performed Overall Cognitive Status: Within Functional Limits for tasks assessed                                          General Comments      Exercises     Assessment/Plan    PT Assessment Patient needs continued PT services  PT Problem List Decreased strength;Decreased activity tolerance;Decreased balance;Decreased mobility       PT Treatment Interventions DME instruction;Gait training;Stair training;Functional mobility training;Therapeutic activities;Therapeutic exercise;Balance training;Patient/family education    PT Goals (Current goals can be found in the Care Plan section)  Acute Rehab PT Goals Patient Stated Goal: return home after rehab PT Goal Formulation: With patient Time For Goal Achievement: 07/31/23 Potential to Achieve Goals: Good    Frequency Min 3X/week     Co-evaluation PT/OT/SLP Co-Evaluation/Treatment: Yes Reason for Co-Treatment: To address functional/ADL transfers PT goals addressed during  session: Mobility/safety with mobility;Balance;Proper use of DME OT goals addressed during session: ADL's and self-care       AM-PAC PT "6 Clicks" Mobility  Outcome Measure Help needed turning from your back to your side while in a flat bed without using bedrails?: None Help needed moving from lying on your back to sitting on the side of a flat bed without using bedrails?: A Little Help needed moving to and from a bed to a chair (including a wheelchair)?: A Little Help needed standing up from a chair using your arms (e.g., wheelchair or bedside chair)?: A Little Help needed to walk in hospital room?: A Lot Help needed climbing 3-5 steps with a railing? : A Lot 6 Click Score: 17    End of Session   Activity Tolerance: Patient tolerated treatment well;Patient limited by fatigue Patient left: in chair;with call bell/phone within reach Nurse Communication: Mobility status PT Visit Diagnosis: Unsteadiness on feet (R26.81);Other abnormalities of gait and mobility (R26.89);Muscle weakness (generalized) (M62.81)    Time: 1610-9604 PT Time Calculation (min) (ACUTE ONLY): 20 min   Charges:   PT Evaluation $PT Eval Moderate Complexity: 1 Mod PT Treatments $Therapeutic Activity: 8-22 mins PT General Charges $$ ACUTE PT VISIT: 1 Visit         11:55 AM, 07/17/23 Ocie Bob, MPT Physical Therapist with [facility] [phone] office [phone] mobile phone

## 2023-07-17 NOTE — Plan of Care (Signed)
  Problem: Acute Rehab OT Goals (only OT should resolve) Goal: Pt. Will Perform Grooming Flowsheets (Taken 07/17/2023 0919) Pt Will Perform Grooming:  with modified independence  standing Goal: Pt. Will Perform Lower Body Dressing Flowsheets (Taken 07/17/2023 0919) Pt Will Perform Lower Body Dressing:  with modified independence  sitting/lateral leans Goal: Pt. Will Transfer To Toilet Flowsheets (Taken 07/17/2023 0919) Pt Will Transfer to Toilet:  with modified independence  ambulating Goal: Pt/Caregiver Will Perform Home Exercise Program Flowsheets (Taken 07/17/2023 0919) Pt/caregiver will Perform Home Exercise Program:  Increased strength  Both right and left upper extremity  Independently  Denece Shearer OT, MOT

## 2023-07-17 NOTE — TOC Progression Note (Signed)
Transition of Care [facility]) - Progression Note    Patient Details  Name: Kenneth Lester MRN: [id number] Date of Birth: November 05, 1955  Transition of Care [facility]) CM/SW Contact  Leitha Bleak, RN Phone Number: 07/17/2023, 3:36 PM  Clinical Narrative:   CM spoke to patients wife she want to come discuss and assess for SNF vs HH. TOC following.   Expected Discharge Plan and Services     Social Determinants of Health (SDOH) Interventions SDOH Screenings   Food Insecurity: No Food Insecurity (07/16/2023)  Housing: Low Risk  (07/16/2023)  Transportation Needs: Unmet Transportation Needs (07/16/2023)  Utilities: Not At Risk (07/16/2023)  Tobacco Use: High Risk (07/16/2023)    Readmission Risk Interventions     No data to display

## 2023-07-17 NOTE — Progress Notes (Signed)
[facility] Progress Note  Patient: Kenneth Lester    ZOX:[id number]  DOA: 07/16/2023     Date of Service: the patient was seen and examined on 07/17/2023  Chief Complaint  Patient presents with   Dizziness   Brief hospital course: Kenneth Lester is a 67 y.o. male with medical history significant of hypertension, chronic hyponatremia, vertigo, alcohol abuse who presents to the emergency department due to several months of onset of generalized weakness and intermittent dizziness.  Dizziness occurs mostly on standing up from seated position and he complains of weakness on standing, patient was concerned that probably his sodium is low again.  He states that he has been compliant with his diuretic (hydrochlorothiazide). Patient was recently admitted from 7/8 to 7/11 due to acute on chronic ambulatory dysfunction which improved with IV hydration. He was recently seen in the ED on 8/13 due to bilateral lower extremity weakness Patient ambulates with a cane at baseline   ED Course:  In the emergency department, BP was 167/84, but other vital signs were within normal range.  Workup in the ED showed normal CBC.  BMP showed sodium 123, potassium 3.2, chloride 87, bicarb 25, blood glucose 97, BUN 7, creatinine 0.66.  Urinalysis was normal.  Urine sodium 33 Potassium was replenished, IV NS 1 L was given.  Hospitalist was asked to admit patient for further evaluation and management.   Assessment and Plan: Principal Problem:   Hyponatremia Active Problems:   Tobacco use disorder   Hypokalemia   Essential hypertension   Dizziness   History of alcohol abuse   Generalized weakness   Acute on chronic hypotonic hyponatremia Unknown cause, could be polydipsia versus SIADH and HCTZ use Hold diuretic for now Serum osmolality 267 low Urine osmolality 350, and urine sodium 33 Na 123--128 Continue IV fluid for hydration Sodium level seems to be improving, monitor tomorrow and DC IV  fluid Continue to monitor sodium with serial BMPs   Dizziness This may be secondary to above Continue treatment as described above   Hypokalemia, resolved K+ is 3.2--.39  Potassium repleted Please monitor for AM K+ for further replenishmemnt   Generalized weakness Patient complained of chronic leg weakness Continue fall precaution Continue PT/OT eval and treat Vitamin B12 deficiency, B12 level 211, goal >400.  Started vitamin B12 1000 mcg IM injection daily during hospital stay followed by oral supplement.  Follow with PCP to repeat vitamin B12 level after 3 to 6 months. Vitamin D level is pending  Essential hypertension Blood pressure soft to lower side might be causing orthostatic hypotension 9/24, skip the morning dose.  Decreased losartan from 50 to 25 mg p.o. daily starting from tomorrow with holding parameters and decreased amlodipine from 10 to 5 mg p.o. nightly with holding parameters Use hydralazine as needed Monitor BP and titrate medications accordingly    History of alcohol abuse Patient shows no sign of alcohol withdrawal Continue to monitor and consider starting patient on CIWA protocol on showing signs of withdrawal   Tobacco use disorder Patient was counseled on tobacco abuse cessation Continue nicotine patch     Body mass index is 20.08 kg/m.  Interventions:  Diet: Regular diet with fluid restriction 1.52/day DVT Prophylaxis: Subcutaneous Lovenox   Advance goals of care discussion: Full code  Family Communication: family was not present at bedside, at the time of interview.  The pt provided permission to discuss medical plan with the family. Opportunity was given to ask question and all questions were answered  satisfactorily.   Disposition:  Pt is from Home, admitted with weakness and Hyonatremia , still has Low Na, which precludes a safe discharge. Discharge to SNF, when stable, in 1-2 days .  Subjective: No significant events overnight, patient  is feeling improvement in generalized weakness, denies any dizziness while sitting, stated that he has not ambulated yet.  Patient did not sleep well last night and stated that they woke me up early today.  Patient denies any specific complaints, no chest pain or palpitation, no shortness of breath.   Physical Exam: General: NAD, lying comfortably Appear in no distress, affect appropriate Eyes: PERRLA ENT: Oral Mucosa Clear, moist  Neck: no JVD,  Cardiovascular: S1 and S2 Present, no Murmur,  Respiratory: good respiratory effort, Bilateral Air entry equal and Decreased, no Crackles, no wheezes Abdomen: Bowel Sound present, Soft and no tenderness,  Skin: no rashes Extremities: no Pedal edema, no calf tenderness Neurologic: without any new focal findings Gait not checked due to patient safety concerns  Vitals:   07/17/23 1100 07/17/23 1144 07/17/23 1300 07/17/23 1400  BP: 129/61  122/72 (!) 112/49  Pulse: (!) 56  74 61  Resp: 10  13 13   Temp:  (!) 97.4 F (36.3 C)    TempSrc:  Axillary    SpO2: 100%  100% 98%  Weight:      Height:        Intake/Output Summary (Last 24 hours) at 07/17/2023 1423 Last data filed at 07/17/2023 1100 Gross per 24 hour  Intake 2611.07 ml  Output --  Net 2611.07 ml   Filed Weights   07/16/23 1340 07/16/23 2200  Weight: 61 kg 59.9 kg    Data Reviewed: I have personally reviewed and interpreted daily labs, tele strips, imagings as discussed above. I reviewed all nursing notes, pharmacy notes, vitals, pertinent old records I have discussed plan of care as described above with RN and patient/family.  CBC: Recent Labs  Lab 07/13/23 1406 07/16/23 1408 07/17/23 0443  WBC 6.8 6.8 5.9  NEUTROABS 3.8  --   --   HGB 14.0 14.1 12.1*  HCT 39.3 39.1 35.4*  MCV 88.7 87.9 89.2  PLT 267 286 250   Basic Metabolic Panel: Recent Labs  Lab 07/13/23 1406 07/16/23 1408 07/17/23 0443 07/17/23 1145  NA 125* 123* 128* 129*  K 3.5 3.2* 3.6 3.9  CL 91*  87* 98 100  CO2 23 25 23 23   GLUCOSE 103* 97 86 122*  BUN 7* 7* 8 7*  CREATININE 0.67 0.66 0.56* 0.53*  CALCIUM 9.5 9.3 8.6* 8.8*  MG  --   --  2.0  --   PHOS  --   --  3.5  --     Studies: No results found.  Scheduled Meds:  amLODipine  5 mg Oral QHS   Chlorhexidine Gluconate Cloth  6 each Topical Daily   enoxaparin (LOVENOX) injection  40 mg Subcutaneous Q24H   influenza vaccine adjuvanted  0.5 mL Intramuscular Tomorrow-1000   [START ON 07/18/2023] losartan  50 mg Oral Daily   nicotine  21 mg Transdermal Daily   Continuous Infusions:  sodium chloride 75 mL/hr at 07/17/23 0918   PRN Meds: acetaminophen **OR** acetaminophen, hydrALAZINE, ondansetron **OR** ondansetron (ZOFRAN) IV, mouth rinse  Time spent: 35 minutes  Author: Gillis Santa. MD [facility] 07/17/2023 2:23 PM  To reach On-call, see care teams to locate the attending and reach out to them via [email or web address]. If 7PM-7AM, please contact night-coverage If you still have  difficulty reaching the attending provider, please page the [facility] (Director on Call) for [facility] on amion for assistance.

## 2023-07-17 NOTE — TOC Initial Note (Signed)
Transition of Care [facility]) - Initial/Assessment Note    Patient Details  Name: Kenneth Lester MRN: [id number] Date of Birth: [birth year]  Transition of Care [facility]) CM/SW Contact:    Leitha Bleak, RN Phone Number: 07/17/2023, 3:55 PM  Clinical Narrative:      Patient admitted with hyponatremia with a high risk of readmission. CM spoke to his wife, he uses a cane and does not drive. PT is recommending SNF. Wife at the bedside, patient is agreeable. He was at [facility] in July. TOC sending out FL2 for bed offers.              Expected Discharge Plan: Skilled Nursing Facility Barriers to Discharge: Continued Medical Work up   Patient Goals and CMS Choice Patient states their goals for this hospitalization and ongoing recovery are:: agreeable to SNF CMS Medicare.gov Compare Post Acute Care list provided to:: Patient Choice offered to / list presented to : Spouse     Expected Discharge Plan and Services     Post Acute Care Choice: Skilled Nursing Facility Living arrangements for the past 2 months: Single Family Home                      Prior Living Arrangements/Services Living arrangements for the past 2 months: Single Family Home Lives with:: Spouse Patient language and need for interpreter reviewed:: Yes Do you feel safe going back to the place where you live?: Yes      Need for Family Participation in Patient Care: Yes (Comment) Care giver support system in place?: Yes (comment)   Criminal Activity/Legal Involvement Pertinent to Current Situation/Hospitalization: No - Comment as needed  Activities of Daily Living Home Assistive Devices/Equipment: Walker (specify type), Cane (specify quad or straight) Holiday representative and front wheeled walker) ADL Screening (condition at time of admission) Is the patient deaf or have difficulty hearing?: No Does the patient have difficulty seeing, even when wearing glasses/contacts?: No Does the patient have difficulty  concentrating, remembering, or making decisions?: No  Permission Sought/Granted                  Emotional Assessment     Affect (typically observed): Accepting Orientation: : Oriented to Self, Oriented to Place, Oriented to  Time, Oriented to Situation Alcohol / Substance Use: Not Applicable Psych Involvement: No (comment)  Admission diagnosis:  Hyponatremia [E87.1] Generalized weakness [R53.1] Patient Active Problem List   Diagnosis Date Noted   Generalized weakness 07/17/2023   Acute hyponatremia 05/01/2023   Tobacco use disorder 05/01/2023   Hypokalemia 05/01/2023   Essential hypertension 05/01/2023   Dizziness 05/01/2023   History of alcohol abuse 05/01/2023   Hyponatremia 05/01/2023   Colon cancer screening 12/31/2018   Clavicle fracture 01/15/2014   Closed fracture of acromial end of clavicle 12/22/2013   PCP:  Carylon Perches, MD Pharmacy:   [facility] [phone] - [place], [place] - [address] AT [facility] [address] [place] [address] Phone: [phone] Fax: [phone]     Social Determinants of Health (SDOH) Social History: SDOH Screenings   Food Insecurity: No Food Insecurity (07/16/2023)  Housing: Low Risk  (07/16/2023)  Transportation Needs: Unmet Transportation Needs (07/16/2023)  Utilities: Not At Risk (07/16/2023)  Tobacco Use: High Risk (07/16/2023)   SDOH Interventions:

## 2023-07-17 NOTE — Plan of Care (Signed)

## 2023-07-18 DIAGNOSIS — R42 Dizziness and giddiness: Secondary | ICD-10-CM | POA: Diagnosis not present

## 2023-07-18 DIAGNOSIS — F17200 Nicotine dependence, unspecified, uncomplicated: Secondary | ICD-10-CM | POA: Diagnosis not present

## 2023-07-18 DIAGNOSIS — I10 Essential (primary) hypertension: Secondary | ICD-10-CM | POA: Diagnosis not present

## 2023-07-18 DIAGNOSIS — E871 Hypo-osmolality and hyponatremia: Secondary | ICD-10-CM | POA: Diagnosis not present

## 2023-07-18 LAB — CBC
HCT: 33.8 % — ABNORMAL LOW (ref 39.0–52.0)
Hemoglobin: 11.6 g/dL — ABNORMAL LOW (ref 13.0–17.0)
MCH: 30.9 pg (ref 26.0–34.0)
MCHC: 34.3 g/dL (ref 30.0–36.0)
MCV: 90.1 fL (ref 80.0–100.0)
Platelets: 234 10*3/uL (ref 150–400)
RBC: 3.75 MIL/uL — ABNORMAL LOW (ref 4.22–5.81)
RDW: 13.3 % (ref 11.5–15.5)
WBC: 5.6 10*3/uL (ref 4.0–10.5)
nRBC: 0 % (ref 0.0–0.2)

## 2023-07-18 LAB — BASIC METABOLIC PANEL
Anion gap: 6 (ref 5–15)
BUN: 7 mg/dL — ABNORMAL LOW (ref 8–23)
CO2: 20 mmol/L — ABNORMAL LOW (ref 22–32)
Calcium: 8.4 mg/dL — ABNORMAL LOW (ref 8.9–10.3)
Chloride: 105 mmol/L (ref 98–111)
Creatinine, Ser: 0.54 mg/dL — ABNORMAL LOW (ref 0.61–1.24)
GFR, Estimated: >60 mL/min (ref >60)
Glucose, Bld: 90 mg/dL (ref 70–99)
Potassium: 3.7 mmol/L (ref 3.5–5.1)
Sodium: 131 mmol/L — ABNORMAL LOW (ref 135–145)

## 2023-07-18 LAB — MAGNESIUM: Magnesium: 2.1 mg/dL (ref 1.7–2.4)

## 2023-07-18 LAB — PHOSPHORUS: Phosphorus: 3 mg/dL (ref 2.5–4.6)

## 2023-07-18 MED ORDER — LACTULOSE 10 GM/15ML PO SOLN
20.0000 g | Freq: Every day | ORAL | Status: DC
  Administered 2023-07-19: 20 g via ORAL
  Filled 2023-07-18: qty 30

## 2023-07-18 MED ORDER — VITAMIN D (ERGOCALCIFEROL) 1.25 MG (50000 UNIT) PO CAPS
50000.0000 [IU] | ORAL_CAPSULE | ORAL | Status: DC
  Administered 2023-07-18: 50000 [IU] via ORAL
  Filled 2023-07-18: qty 1

## 2023-07-18 MED ORDER — SODIUM CHLORIDE 0.9 % IV SOLN: INTRAVENOUS | Status: DC

## 2023-07-18 NOTE — Plan of Care (Signed)

## 2023-07-18 NOTE — Progress Notes (Signed)
Occupational Therapy Treatment Patient Details Name: Kenneth Lester MRN: [id number] DOB: 1956-10-10 Today's Date: 07/18/2023   History of present illness Kenneth Lester is a 67 y.o. male with medical history significant of hypertension, chronic hyponatremia, vertigo, alcohol abuse who presents to the emergency department due to several months of onset of generalized weakness and intermittent dizziness.  Dizziness occurs mostly on standing up from seated position and he complains of weakness on standing, patient was concerned that probably his sodium is low again.  He states that he has been compliant with his diuretic (hydrochlorothiazide).  Patient was recently admitted from 7/8 to 7/11 due to acute on chronic ambulatory dysfunction which improved with IV hydration.  He was recently seen in the ED on 8/13 due to bilateral lower extremity weakness  Patient ambulates with a cane at baseline   OT comments  Pt agreeable to OT treatment. Pt able to complete bed mobility without physical assist with HOB elevated. CGA throughout session for mobility and transfers with cane and RW. Extended time needed for mobility. Pt was also able to engage in brief ambulation in the room and a couple B UE strengthening exercises. Pt is demonstrating improved mobility and endurance today. Pt will benefit from continued OT in the hospital and recommended venue below to increase strength, balance, and endurance for safe ADL's.         If plan is discharge home, recommend the following:  A little help with walking and/or transfers;A little help with bathing/dressing/bathroom;Assistance with cooking/housework;Help with stairs or ramp for entrance;Assist for transportation   Equipment Recommendations  None recommended by OT          Precautions / Restrictions Precautions Precautions: Fall Restrictions Weight Bearing Restrictions: No       Mobility Bed Mobility Overal bed mobility: Needs Assistance Bed  Mobility: Supine to Sit     Supine to sit: Modified independent (Device/Increase time), HOB elevated          Transfers Overall transfer level: Needs assistance Equipment used: Rolling walker (2 wheels), Straight cane Transfers: Sit to/from Stand, Bed to chair/wheelchair/BSC Sit to Stand: Contact guard assist     Step pivot transfers: Contact guard assist     General transfer comment: Gilmer Mor used for stand pivot to chair with CGA assist and extended time. RW used for ambulation.     Balance Overall balance assessment: Needs assistance Sitting-balance support: Feet supported, No upper extremity supported Sitting balance-Leahy Scale: Good Sitting balance - Comments: seated at EOB   Standing balance support: During functional activity, Single extremity supported Standing balance-Leahy Scale: Poor Standing balance comment: fair/poor using SPC                           ADL either performed or assessed with clinical judgement   ADL Overall ADL's : Needs assistance/impaired                     Lower Body Dressing: Set up;Sitting/lateral leans Lower Body Dressing Details (indicate cue type and reason): Able to slide on shoes prior to transfer while seated at EOB.             Functional mobility during ADLs: Contact guard assist;Rolling walker (2 wheels) General ADL Comments: Able to ambulate ~8 feet forward and backward in room with RW.    Extremity/Trunk Assessment  Cognition Arousal: Alert Behavior During Therapy: WFL for tasks assessed/performed Overall Cognitive Status: Within Functional Limits for tasks assessed                                          Exercises Exercises: General Upper Extremity General Exercises - Upper Extremity Shoulder Flexion: 10 reps, AROM, Both, Seated (x10 protraction as well)                 Pertinent Vitals/ Pain       Pain Assessment Pain  Assessment: Faces Faces Pain Scale: No hurt                                                          Frequency  Min 2X/week        Progress Toward Goals  OT Goals(current goals can now be found in the care plan section)  Progress towards OT goals: Progressing toward goals  Acute Rehab OT Goals Patient Stated Goal: improve function prior to return home OT Goal Formulation: With patient Time For Goal Achievement: 07/31/23 Potential to Achieve Goals: Good ADL Goals Pt Will Perform Grooming: with modified independence;standing Pt Will Perform Lower Body Dressing: with modified independence;sitting/lateral leans Pt Will Transfer to Toilet: with modified independence;ambulating Pt/caregiver will Perform Home Exercise Program: Increased strength;Both right and left upper extremity;Independently  Plan                                      End of Session Equipment Utilized During Treatment: Rolling walker (2 wheels);Other (comment) (cane)  OT Visit Diagnosis: Unsteadiness on feet (R26.81);Other abnormalities of gait and mobility (R26.89);Muscle weakness (generalized) (M62.81)   Activity Tolerance Patient tolerated treatment well   Patient Left in bed;with call bell/phone within reach   Nurse Communication  (notified pt chose to get back in bed)        Time: 0901-0920 OT Time Calculation (min): 19 min  Charges: OT General Charges $OT Visit: 1 Visit OT Treatments $Therapeutic Exercise: 8-22 mins  Alayasia Breeding OT, MOT  Danie Chandler 07/18/2023, 9:50 AM

## 2023-07-18 NOTE — Hospital Course (Signed)
67 y.o. male with medical history significant of hypertension, chronic hyponatremia, vertigo, alcohol abuse who presents to the emergency department due to several months of onset of generalized weakness and intermittent dizziness.  Dizziness occurs mostly on standing up from seated position and he complains of weakness on standing, patient was concerned that probably his sodium is low again.  He states that he has been compliant with his diuretic (hydrochlorothiazide).  Patient was recently admitted from 7/8 to 7/11 due to acute on chronic ambulatory dysfunction which improved with IV hydration.  He was recently seen in the ED on 8/13 due to bilateral lower extremity weakness.  Patient ambulates with a cane at baseline   ED Course:  In the emergency department, BP was 167/84, but other vital signs were within normal range.  Workup in the ED showed normal CBC.  BMP showed sodium 123, potassium 3.2, chloride 87, bicarb 25, blood glucose 97, BUN 7, creatinine 0.66.  Urinalysis was normal.  Urine sodium 33.  Potassium was replenished, IV NS 1 L was given.  Hospitalist was asked to admit patient for further evaluation and management.

## 2023-07-18 NOTE — Progress Notes (Signed)
PROGRESS NOTE   Kenneth Lester  OZH:[id number] DOB: [birth year] DOA: 07/16/2023 PCP: Carylon Perches, MD   Chief Complaint  Patient presents with   Dizziness   Level of care: Telemetry  Brief Admission History:  67 y.o. male with medical history significant of hypertension, chronic hyponatremia, vertigo, alcohol abuse who presents to the emergency department due to several months of onset of generalized weakness and intermittent dizziness.  Dizziness occurs mostly on standing up from seated position and he complains of weakness on standing, patient was concerned that probably his sodium is low again.  He states that he has been compliant with his diuretic (hydrochlorothiazide).  Patient was recently admitted from 7/8 to 7/11 due to acute on chronic ambulatory dysfunction which improved with IV hydration.  He was recently seen in the ED on 8/13 due to bilateral lower extremity weakness.  Patient ambulates with a cane at baseline   ED Course:  In the emergency department, BP was 167/84, but other vital signs were within normal range.  Workup in the ED showed normal CBC.  BMP showed sodium 123, potassium 3.2, chloride 87, bicarb 25, blood glucose 97, BUN 7, creatinine 0.66.  Urinalysis was normal.  Urine sodium 33.  Potassium was replenished, IV NS 1 L was given.  Hospitalist was asked to admit patient for further evaluation and management.   Assessment and Plan:  Acute on chronic hypotonic hyponatremia Unknown cause, could be polydipsia versus SIADH and HCTZ use Hold diuretics Serum osmolality 267 low Urine osmolality 350, and urine sodium 33 Na 123--131  Continue IV fluid for hydration Sodium level improving, now up to 131    Dizziness This may be secondary to above Continue treatment as described above   Hypokalemia, resolved K+ is 3.2--.39  Potassium repleted Please monitor for AM K+ for further replenishmemnt   Generalized weakness Patient complained of chronic leg  weakness Continue fall precaution Continue PT/OT eval and treat -- recommending SNF placement  Vitamin B12 deficiency, B12 level 211, goal >400.  Started vitamin B12 1000 mcg IM injection daily during hospital stay followed by oral supplement.  Follow with PCP to repeat vitamin B12 level after 3 to 6 months. Vitamin D level is low at 18, vitamin D supplementation ordered    Essential hypertension Blood pressure soft to lower side might be causing orthostatic hypotension 9/24, skip the morning dose.  Decreased losartan from 50 to 25 mg p.o. daily starting from tomorrow with holding parameters and decreased amlodipine from 10 to 5 mg p.o. nightly with holding parameters Use hydralazine as needed Monitor BP and titrate medications accordingly    History of alcohol abuse Patient shows no sign of alcohol withdrawal Continue to monitor and consider starting patient on CIWA protocol on showing signs of withdrawal   Tobacco use disorder Patient was counseled on tobacco abuse cessation Continue nicotine patch   DVT prophylaxis: enoxaparin  Code Status: Full  Family Communication:  Disposition: DC to SNF in AM 9/26    Consultants:   Procedures:   Antimicrobials:    Subjective: No specific complaints today.   Objective: Vitals:   07/18/23 1000 07/18/23 1100 07/18/23 1120 07/18/23 1236  BP:  (!) 152/76  131/63  Pulse: (!) 56   (!) 57  Resp: 15 18  18   Temp:   98.1 F (36.7 C) 97.8 F (36.6 C)  TempSrc:   Oral Oral  SpO2: 100% 98%  100%  Weight:      Height:  Intake/Output Summary (Last 24 hours) at 07/18/2023 1514 Last data filed at 07/18/2023 1100 Gross per 24 hour  Intake 1588.04 ml  Output 575 ml  Net 1013.04 ml   Filed Weights   07/16/23 1340 07/16/23 2200  Weight: 61 kg 59.9 kg   Examination:  General exam: Appears calm and comfortable  Respiratory system: Clear to auscultation. Respiratory effort normal. Cardiovascular system: normal S1 & S2 heard. No  JVD, murmurs, rubs, gallops or clicks. No pedal edema. Gastrointestinal system: Abdomen is nondistended, soft and nontender. No organomegaly or masses felt. Normal bowel sounds heard. Central nervous system: Alert and oriented. No focal neurological deficits. Extremities: Symmetric 5 x 5 power. Skin: No rashes, lesions or ulcers. Psychiatry: Judgement and insight appear normal. Mood & affect appropriate.   Data Reviewed: I have personally reviewed following labs and imaging studies  CBC: Recent Labs  Lab 07/13/23 1406 07/16/23 1408 07/17/23 0443 07/18/23 0525  WBC 6.8 6.8 5.9 5.6  NEUTROABS 3.8  --   --   --   HGB 14.0 14.1 12.1* 11.6*  HCT 39.3 39.1 35.4* 33.8*  MCV 88.7 87.9 89.2 90.1  PLT 267 286 250 234    Basic Metabolic Panel: Recent Labs  Lab 07/16/23 1408 07/17/23 0443 07/17/23 1145 07/17/23 1813 07/18/23 0525  NA 123* 128* 129* 129* 131*  K 3.2* 3.6 3.9 3.8 3.7  CL 87* 98 100 101 105  CO2 25 23 23  20* 20*  GLUCOSE 97 86 122* 109* 90  BUN 7* 8 7* 8 7*  CREATININE 0.66 0.56* 0.53* 0.64 0.54*  CALCIUM 9.3 8.6* 8.8* 8.5* 8.4*  MG  --  2.0  --   --  2.1  PHOS  --  3.5  --   --  3.0    CBG: Recent Labs  Lab 07/16/23 1347  GLUCAP 95    Recent Results (from the past 240 hour(s))  MRSA Next Gen by PCR, Nasal     Status: None   Collection Time: 07/16/23 10:00 PM   Specimen: Nasal Mucosa; Nasal Swab  Result Value Ref Range Status   MRSA by PCR Next Gen NOT DETECTED NOT DETECTED Final    Comment: (NOTE) The GeneXpert MRSA Assay (FDA approved for NASAL specimens only), is one component of a comprehensive MRSA colonization surveillance program. It is not intended to diagnose MRSA infection nor to guide or monitor treatment for MRSA infections. Test performance is not FDA approved in patients less than 64 years old. Performed at [facility], [address] [place], [place] [address]      Radiology Studies: No results found.  Scheduled Meds:   amLODipine  5 mg Oral QHS   Chlorhexidine Gluconate Cloth  6 each Topical Daily   cyanocobalamin  1,000 mcg Intramuscular Daily   Followed by   Melene Muller ON 07/24/2023] vitamin B-12  1,000 mcg Oral Daily   enoxaparin (LOVENOX) injection  40 mg Subcutaneous Q24H   influenza vaccine adjuvanted  0.5 mL Intramuscular Tomorrow-1000   lactulose  20 g Oral Daily   losartan  25 mg Oral Daily   nicotine  21 mg Transdermal Daily   Vitamin D (Ergocalciferol)  50,000 Units Oral Q7 days   Continuous Infusions:  sodium chloride 50 mL/hr at 07/18/23 1009    LOS: 2 days   Time spent: 48 mins  Shamarie Call Laural Benes, MD How to contact the [facility] Attending or Consulting provider 7A - 7P or covering provider during after hours 7P -7A, for this patient?  Check  the care team in [facility] and look for a) attending/consulting TRH provider listed and b) the [facility] team listed Log into [email or web address] and use [place]'s universal password to access. If you do not have the password, please contact the hospital operator. Locate the [facility] provider you are looking for under [facility] and page to a number that you can be directly reached. If you still have difficulty reaching the provider, please page the [facility] (Director on Call) for the Hospitalists listed on amion for assistance.  07/18/2023, 3:14 PM

## 2023-07-18 NOTE — Progress Notes (Signed)
Patient is not easy to work with. He refused to get up and move, he refused oral care, he refused B12 injection. He only wanted his 2200 medication and to be left alone to sleep.

## 2023-07-18 NOTE — Care Management Important Message (Signed)
Important Message  Patient Details  Name: Kenneth Lester MRN: [id number] Date of Birth: 10-19-1956   Important Message Given:  N/A - LOS <3 / Initial given by admissions     Corey Harold 07/18/2023, 2:16 PM

## 2023-07-18 NOTE — TOC Progression Note (Signed)
Transition of Care [facility]) - Progression Note    Patient Details  Name: RAKIEM FACTOR MRN: [id number] Date of Birth: 10/20/1956  Transition [facility]) CM/SW Contact  Leitha Bleak, RN Phone Number: 07/18/2023, 9:42 AM  Clinical Narrative:   Discussed bed offer with patient's wife. She accepted [facility]. INS Auth started.    Expected Discharge Plan: Skilled Nursing Facility Barriers to Discharge: Continued Medical Work up  Expected Discharge Plan and Services     Post Acute Care Choice: Skilled Nursing Facility Living arrangements for the past 2 months: Single Family Home        Social Determinants of Health (SDOH) Interventions SDOH Screenings   Food Insecurity: No Food Insecurity (07/16/2023)  Housing: Low Risk  (07/16/2023)  Transportation Needs: Unmet Transportation Needs (07/16/2023)  Utilities: Not At Risk (07/16/2023)  Tobacco Use: High Risk (07/16/2023)    Readmission Risk Interventions     No data to display

## 2023-07-18 NOTE — TOC Progression Note (Signed)
Transition of Care [facility]) - Progression Note    Patient Details  Name: Kenneth Lester MRN: [id number] Date of Birth: 1956-05-06  Transition of Care [facility]) CM/SW Contact  Leitha Bleak, RN Phone Number: 07/18/2023, 2:27 PM  Clinical Narrative:   Insurance auth received for 9/25 - 9/27, next review 9/27, navi auth id [id number], plan auth id [id number]. CM called [place], [phone]) central intake for [facility]. They will have a male bed tomorrow. MD/RN updated. Planning to DC tomorrow.    Expected Discharge Plan: Skilled Nursing Facility Barriers to Discharge: Continued Medical Work up  Expected Discharge Plan and Services     Post Acute Care Choice: Skilled Nursing Facility Living arrangements for the past 2 months: Single Family Home                   Social Determinants of Health (SDOH) Interventions SDOH Screenings   Food Insecurity: No Food Insecurity (07/16/2023)  Housing: Low Risk  (07/16/2023)  Transportation Needs: Unmet Transportation Needs (07/16/2023)  Utilities: Not At Risk (07/16/2023)  Tobacco Use: High Risk (07/16/2023)    Readmission Risk Interventions    07/18/2023    2:27 PM  Readmission Risk Prevention Plan  Transportation Screening Complete  PCP or Specialist Appt within 3-5 Days Complete  HRI or Home Care Consult Complete  Social Work Consult for Recovery Care Planning/Counseling Complete  Palliative Care Screening Not Applicable  Medication Review [employer]) Complete

## 2023-07-19 ENCOUNTER — Encounter (HOSPITAL_COMMUNITY): Payer: Self-pay | Admitting: Internal Medicine

## 2023-07-19 DIAGNOSIS — I10 Essential (primary) hypertension: Secondary | ICD-10-CM | POA: Diagnosis not present

## 2023-07-19 DIAGNOSIS — R279 Unspecified lack of coordination: Secondary | ICD-10-CM | POA: Diagnosis not present

## 2023-07-19 DIAGNOSIS — R42 Dizziness and giddiness: Secondary | ICD-10-CM | POA: Diagnosis not present

## 2023-07-19 DIAGNOSIS — E871 Hypo-osmolality and hyponatremia: Secondary | ICD-10-CM | POA: Diagnosis not present

## 2023-07-19 DIAGNOSIS — E876 Hypokalemia: Secondary | ICD-10-CM | POA: Diagnosis not present

## 2023-07-19 DIAGNOSIS — Z23 Encounter for immunization: Secondary | ICD-10-CM | POA: Diagnosis present

## 2023-07-19 DIAGNOSIS — D519 Vitamin B12 deficiency anemia, unspecified: Secondary | ICD-10-CM | POA: Diagnosis not present

## 2023-07-19 DIAGNOSIS — F17200 Nicotine dependence, unspecified, uncomplicated: Secondary | ICD-10-CM | POA: Diagnosis not present

## 2023-07-19 DIAGNOSIS — M6281 Muscle weakness (generalized): Secondary | ICD-10-CM | POA: Diagnosis not present

## 2023-07-19 DIAGNOSIS — R531 Weakness: Secondary | ICD-10-CM | POA: Diagnosis not present

## 2023-07-19 MED ORDER — CYANOCOBALAMIN 1000 MCG PO TABS: 1000.0000 ug | ORAL_TABLET | Freq: Every day | ORAL | Status: DC

## 2023-07-19 MED ORDER — LOSARTAN POTASSIUM 25 MG PO TABS: 25.0000 mg | ORAL_TABLET | Freq: Every day | ORAL | Status: AC

## 2023-07-19 MED ORDER — AMLODIPINE BESYLATE 5 MG PO TABS: 5.0000 mg | ORAL_TABLET | Freq: Every day | ORAL | Status: AC

## 2023-07-19 MED ORDER — VITAMIN D (ERGOCALCIFEROL) 1.25 MG (50000 UNIT) PO CAPS: 50000.0000 [IU] | ORAL_CAPSULE | ORAL | Status: DC

## 2023-07-19 MED ORDER — LACTULOSE 20 GM/30ML PO SOLN: 30.0000 mL | Freq: Every day | ORAL | Status: DC | PRN

## 2023-07-19 NOTE — Discharge Summary (Signed)
Physician Discharge Summary  Kenneth Lester XBJ:[id number] DOB: 24-Dec-1955 DOA: 07/16/2023  PCP: Carylon Perches, MD  Admit date: 07/16/2023 Discharge date: 07/19/2023  Disposition:  SNF   Recommendations for Outpatient Follow-up:  Follow up with PCP in 2 weeks Please obtain BMP in 1 week to follow up sodium level Please recheck vitamin D level in 1 month   Discharge Condition: STABLE   CODE STATUS: FULL DIET: regular    Brief Hospitalization Summary: Please see all hospital notes, images, labs for full details of the hospitalization. ADMISSION PROVIDER HPI:  67 y.o. male with medical history significant of hypertension, chronic hyponatremia, vertigo, alcohol abuse who presents to the emergency department due to several months of onset of generalized weakness and intermittent dizziness.  Dizziness occurs mostly on standing up from seated position and he complains of weakness on standing, patient was concerned that probably his sodium is low again.  He states that he has been compliant with his diuretic (hydrochlorothiazide).  Patient was recently admitted from 7/8 to 7/11 due to acute on chronic ambulatory dysfunction which improved with IV hydration.  He was recently seen in the ED on 8/13 due to bilateral lower extremity weakness.  Patient ambulates with a cane at baseline   ED Course:  In the emergency department, BP was 167/84, but other vital signs were within normal range.  Workup in the ED showed normal CBC.  BMP showed sodium 123, potassium 3.2, chloride 87, bicarb 25, blood glucose 97, BUN 7, creatinine 0.66.  Urinalysis was normal.  Urine sodium 33.  Potassium was replenished, IV NS 1 L was given.  Hospitalist was asked to admit patient for further evaluation and management.  Hospital Course by problem list  Acute on chronic hypotonic hyponatremia Unknown cause, could be polydipsia versus SIADH and HCTZ use Hold diuretics Serum osmolality 267 low Urine osmolality 350, and urine  sodium 33 Na 123--131  He was treated with IV fluid for hydration and this improved his low sodium levels Sodium level improving, now up to 131 which is his long-term baseline Hydrochlorothiazide will not be restarted at discharge    Dizziness - resolved now This may be secondary to above Ongoing PT therapy at SNF rehab    Hypokalemia, resolved K+ is 3.2--.39  Potassium repleted Please monitor for AM K+ for further replenishmemnt   Generalized weakness Patient complained of chronic leg weakness Continue fall precaution Continue PT/OT eval and treat -- recommending SNF placement  Vitamin B12 deficiency, B12 level 211, goal >400.  Started vitamin B12 1000 mcg IM injection daily during hospital stay followed by oral supplement.  Follow with PCP to repeat vitamin B12 level after 3 to 6 months. Vitamin D level is low at 18, vitamin D supplementation ordered    Essential hypertension Blood pressure soft to lower side might be causing orthostatic hypotension 9/24, skip the morning dose.  Decreased losartan from 50 to 25 mg p.o. daily starting from tomorrow with holding parameters and decreased amlodipine from 10 to 5 mg p.o. nightly with holding parameters Use hydralazine as needed Monitor BP and titrate medications accordingly    History of alcohol abuse Patient shows no sign of alcohol withdrawal   Tobacco use disorder Patient was counseled on tobacco abuse cessation  Discharge Diagnoses:  Principal Problem:   Hyponatremia Active Problems:   Tobacco use disorder   Hypokalemia   Essential hypertension   Dizziness   History of alcohol abuse   Generalized weakness   Discharge Instructions:  Allergies as  of 07/19/2023       Reactions   Other Other (See Comments)   Patient states he was allergic to something he was given in an IV at the hospital when being treated for pneumonia, but he doesn't remember what the name of the medication was. He states it paralyzed him on his  left side.        Medication List     STOP taking these medications    hydrochlorothiazide 25 MG tablet Commonly known as: HYDRODIURIL       TAKE these medications    amLODipine 5 MG tablet Commonly known as: NORVASC Take 1 tablet (5 mg total) by mouth daily. What changed:  medication strength how much to take   cyanocobalamin 1000 MCG tablet Take 1 tablet (1,000 mcg total) by mouth daily. Start taking on: July 24, 2023   Lactulose 20 GM/30ML Soln Take 30 mLs (20 g total) by mouth daily as needed (constipation). What changed:  when to take this reasons to take this   losartan 25 MG tablet Commonly known as: COZAAR Take 1 tablet (25 mg total) by mouth daily. What changed:  medication strength how much to take   Vitamin D (Ergocalciferol) 1.25 MG (50000 UNIT) Caps capsule Commonly known as: DRISDOL Take 1 capsule (50,000 Units total) by mouth every 7 (seven) days. Start taking on: July 25, 2023        Follow-up Information     Carylon Perches, MD. Schedule an appointment as soon as possible for a visit in 2 week(s).   Specialty: Internal Medicine Why: Hospital Follow Up Contact information: [address] [place] [address] [phone]                Allergies  Allergen Reactions   Other Other (See Comments)    Patient states he was allergic to something he was given in an IV at the hospital when being treated for pneumonia, but he doesn't remember what the name of the medication was. 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   Allergies as of 07/19/2023       Reactions   Other Other (See Comments)   Patient states he was allergic to something he was given in an IV at the hospital when being treated for pneumonia, but he doesn't remember what the name of the medication was. 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        Medication List     STOP taking these medications    hydrochlorothiazide 25 MG tablet Commonly  known as: HYDRODIURIL       TAKE these medications    amLODipine 5 MG tablet Commonly known as: NORVASC Take 1 tablet (5 mg total) by mouth daily. What changed:  medication strength how much to take   cyanocobalamin 1000 MCG tablet Take 1 tablet (1,000 mcg total) by mouth daily. Start taking on: July 24, 2023   Lactulose 20 GM/30ML Soln Take 30 mLs (20 g total) by mouth daily as needed (constipation). What changed:  when to take this reasons to take this   losartan 25 MG tablet Commonly known as: COZAAR Take 1 tablet (25 mg total) by mouth daily. What changed:  medication strength how much to take   Vitamin D (Ergocalciferol) 1.25 MG (50000 UNIT) Caps capsule Commonly known as: DRISDOL Take 1 capsule (50,000 Units total) by mouth every 7 (seven) days. Start taking on: July 25, 2023  Procedures/Studies: CT ABDOMEN PELVIS WO CONTRAST  Result Date: 07/13/2023 CLINICAL DATA:  Constipation. EXAM: CT ABDOMEN AND PELVIS WITHOUT CONTRAST TECHNIQUE: Multidetector CT imaging of the abdomen and pelvis was performed following the standard protocol without IV contrast. RADIATION DOSE REDUCTION: This exam was performed according to the departmental dose-optimization program which includes automated exposure control, adjustment of the mA and/or kV according to patient size and/or use of iterative reconstruction technique. COMPARISON:  CT scan 07/10/2023 FINDINGS: Lower chest: Breathing motion along the lung bases. Mild basilar atelectasis. No pleural effusion. Wall thickening along the lower esophagus. Please correlate with any symptoms. Hepatobiliary: On this non IV contrast exam, grossly preserved hepatic parenchyma. Gallbladder is nondilated. Pancreas: Unremarkable. No pancreatic ductal dilatation or surrounding inflammatory changes. Spleen: Normal in size without focal abnormality. Adrenals/Urinary Tract: Stable mild thickening of the adrenal glands with some nodularity on  the right, unchanged. The more focal area along the lateral limb of the right adrenal gland has a diameter 12 mm. Not clearly an adenoma. No abnormal calcifications are seen within either kidney. Distended urinary bladder. Stable lower pole left-sided benign-appearing renal cysts. No specific imaging follow-up. Bosniak 2 lesion. Stomach/Bowel: Once again there is diffuse colonic stool. Overall moderate amount of stool is seen. More in the transverse and ascending colon. There is a redundant course of the sigmoid colon extending into the right upper quadrant, epigastric region, anterior to the stomach. No twisting of the mesentery or bowel dilatation. There is some contrast in the stomach and nondilated loops of small bowel. Vascular/Lymphatic: Extensive vascular calcifications. Normal caliber aorta and IVC. No specific abnormal lymph node enlargement identified on this noncontrast examination. Reproductive: Prostate is unremarkable. Other: There is streak artifact from the arms being scanned at the patient's side as well as motion limiting evaluation. Musculoskeletal: Osteopenia and degenerative changes along the spine. Transitional lumbosacral segment. IMPRESSION: No bowel obstruction but there is continued diffuse moderate to severe colonic stool. Redundant course of the sigmoid colon extending into the epigastric region, anterior to the stomach. No obstructing renal stone.  Distended urinary bladder. Wall thickening once again seen of the distal esophagus. Please correlate with symptomatology. Electronically Signed   By: Karen Kays M.D.   On: 07/13/2023 16:55   CT ABDOMEN PELVIS W CONTRAST  Result Date: 07/10/2023 CLINICAL DATA:  Nonlocalized abdominal pain with constipation EXAM: CT ABDOMEN AND PELVIS WITH CONTRAST TECHNIQUE: Multidetector CT imaging of the abdomen and pelvis was performed using the standard protocol following bolus administration of intravenous contrast. RADIATION DOSE REDUCTION: This  exam was performed according to the departmental dose-optimization program which includes automated exposure control, adjustment of the mA and/or kV according to patient size and/or use of iterative reconstruction technique. CONTRAST:  OMNIPAQUE IOHEXOL 300 MG/ML  SOLN COMPARISON:  None Available. FINDINGS: Lower chest: Circumferential low-density thickening of the esophagus Hepatobiliary: No focal liver abnormality.No evidence of biliary obstruction or stone. Pancreas: Unremarkable. Spleen: Unremarkable. Adrenals/Urinary Tract: Negative adrenals. No hydronephrosis or stone. Unremarkable bladder. Stomach/Bowel: Generalized gas and stool distention of the colon. No small bowel obstruction or visible inflammation. Appendix is not well identified. No secondary signs of appendicitis Vascular/Lymphatic: Extensive atheromatous calcification of the aorta and branch vessels. No mass or adenopathy. Reproductive:No pathologic findings. Other: No ascites or pneumoperitoneum. Musculoskeletal: No acute abnormalities. IMPRESSION: Diffuse gas and stool distention of the colon without underlying obstruction or inflammation. Generalized prominent esophageal thickness, correlate for esophagitis symptoms. Electronically Signed   By: Tiburcio Pea M.D.   On: 07/10/2023 04:00  Subjective: Pt is agreeable to going to SNF rehab.    Discharge Exam: Vitals:   07/18/23 2100 07/19/23 0528  BP: 129/72 128/67  Pulse: 60 61  Resp: 18 18  Temp: 98.1 F (36.7 C) 98.4 F (36.9 C)  SpO2: 100% 99%   Vitals:   07/18/23 1120 07/18/23 1236 07/18/23 2100 07/19/23 0528  BP:  131/63 129/72 128/67  Pulse:  (!) 57 60 61  Resp:  18 18 18   Temp: 98.1 F (36.7 C) 97.8 F (36.6 C) 98.1 F (36.7 C) 98.4 F (36.9 C)  TempSrc: Oral Oral Oral Oral  SpO2:  100% 100% 99%  Weight:      Height:        General: Pt is alert, awake, not in acute distress Cardiovascular: normal S1/S2 +, no rubs, no gallops Respiratory: CTA  bilaterally, no wheezing, no rhonchi Abdominal: Soft, NT, ND, bowel sounds + Extremities: no edema, no cyanosis   The results of significant diagnostics from this hospitalization (including imaging, microbiology, ancillary and laboratory) are listed below for reference.     Microbiology: Recent Results (from the past 240 hour(s))  MRSA Next Gen by PCR, Nasal     Status: None   Collection Time: 07/16/23 10:00 PM   Specimen: Nasal Mucosa; Nasal Swab  Result Value Ref Range Status   MRSA by PCR Next Gen NOT DETECTED NOT DETECTED Final    Comment: (NOTE) The GeneXpert MRSA Assay (FDA approved for NASAL specimens only), is one component of a comprehensive MRSA colonization surveillance program. It is not intended to diagnose MRSA infection nor to guide or monitor treatment for MRSA infections. Test performance is not FDA approved in patients less than 23 years old. Performed at [facility], [address], [place], [place] [address]      Labs: BNP (last 3 results) No results for input(s): "BNP" in the last 8760 hours. Basic Metabolic Panel: Recent Labs  Lab 07/16/23 1408 07/17/23 0443 07/17/23 1145 07/17/23 1813 07/18/23 0525  NA 123* 128* 129* 129* 131*  K 3.2* 3.6 3.9 3.8 3.7  CL 87* 98 100 101 105  CO2 25 23 23  20* 20*  GLUCOSE 97 86 122* 109* 90  BUN 7* 8 7* 8 7*  CREATININE 0.66 0.56* 0.53* 0.64 0.54*  CALCIUM 9.3 8.6* 8.8* 8.5* 8.4*  MG  --  2.0  --   --  2.1  PHOS  --  3.5  --   --  3.0   Liver Function Tests: Recent Labs  Lab 07/13/23 1406 07/17/23 0443  AST 24 17  ALT 24 16  ALKPHOS 59 44  BILITOT 0.6 0.6  PROT 7.9 6.2*  ALBUMIN 4.8 3.5   Recent Labs  Lab 07/13/23 1406  LIPASE 31   No results for input(s): "AMMONIA" in the last 168 hours. CBC: Recent Labs  Lab 07/13/23 1406 07/16/23 1408 07/17/23 0443 07/18/23 0525  WBC 6.8 6.8 5.9 5.6  NEUTROABS 3.8  --   --   --   HGB 14.0 14.1 12.1* 11.6*  HCT 39.3 39.1 35.4* 33.8*  MCV 88.7 87.9 89.2  90.1  PLT 267 286 250 234   Cardiac Enzymes: No results for input(s): "CKTOTAL", "CKMB", "CKMBINDEX", "TROPONINI" in the last 168 hours. BNP: Invalid input(s): "POCBNP" CBG: Recent Labs  Lab 07/16/23 1347  GLUCAP 95   D-Dimer No results for input(s): "DDIMER" in the last 72 hours. Hgb A1c No results for input(s): "HGBA1C" in the last 72 hours. Lipid Profile No results  for input(s): "CHOL", "HDL", "LDLCALC", "TRIG", "CHOLHDL", "LDLDIRECT" in the last 72 hours. Thyroid function studies No results for input(s): "TSH", "T4TOTAL", "T3FREE", "THYROIDAB" in the last 72 hours.  Invalid input(s): "FREET3" Anemia work up Recent Labs    07/17/23 0921  VITAMINB12 211  FOLATE 9.9  TIBC 266  IRON 90   Urinalysis    Component Value Date/Time   COLORURINE YELLOW 07/16/2023 1703   APPEARANCEUR CLEAR 07/16/2023 1703   LABSPEC 1.011 07/16/2023 1703   PHURINE 7.0 07/16/2023 1703   GLUCOSEU NEGATIVE 07/16/2023 1703   HGBUR NEGATIVE 07/16/2023 1703   BILIRUBINUR NEGATIVE 07/16/2023 1703   KETONESUR 5 (A) 07/16/2023 1703   PROTEINUR NEGATIVE 07/16/2023 1703   NITRITE NEGATIVE 07/16/2023 1703   LEUKOCYTESUR NEGATIVE 07/16/2023 1703   Sepsis Labs Recent Labs  Lab 07/13/23 1406 07/16/23 1408 07/17/23 0443 07/18/23 0525  WBC 6.8 6.8 5.9 5.6   Microbiology Recent Results (from the past 240 hour(s))  MRSA Next Gen by PCR, Nasal     Status: None   Collection Time: 07/16/23 10:00 PM   Specimen: Nasal Mucosa; Nasal Swab  Result Value Ref Range Status   MRSA by PCR Next Gen NOT DETECTED NOT DETECTED Final    Comment: (NOTE) The GeneXpert MRSA Assay (FDA approved for NASAL specimens only), is one component of a comprehensive MRSA colonization surveillance program. It is not intended to diagnose MRSA infection nor to guide or monitor treatment for MRSA infections. Test performance is not FDA approved in patients less than 59 years old. Performed at [facility], [address],  [place], [place] [address]    Time coordinating discharge:  41 mins  SIGNED:  Standley Dakins, MD  [facility] 07/19/2023, 11:14 AM How to contact the [facility] Attending or Consulting provider 7A - 7P or covering provider during after hours 7P -7A, for this patient?  Check the care team in [facility] and look for a) attending/consulting [facility] provider listed and b) the [facility] team listed Log into [email or web address] and use [place]'s universal password to access. If you do not have the password, please contact the hospital operator. Locate the [facility] provider you are looking for under [facility] and page to a number that you can be directly reached. If you still have difficulty reaching the provider, please page the [facility] (Director on Call) for the Hospitalists listed on amion for assistance.

## 2023-07-19 NOTE — Care Management Important Message (Signed)
Important Message  Patient Details  Name: Kenneth Lester MRN: [id number] Date of Birth: 1955-11-06   Important Message Given:  N/A - LOS <3 / Initial given by admissions     Corey Harold 07/19/2023, 11:28 AM

## 2023-07-19 NOTE — Discharge Instructions (Signed)
IMPORTANT INFORMATION: PAY CLOSE ATTENTION   PHYSICIAN DISCHARGE INSTRUCTIONS  Follow with Primary care provider  Carylon Perches, MD  and other consultants as instructed by your Hospitalist Physician  SEEK MEDICAL CARE OR RETURN TO EMERGENCY ROOM IF SYMPTOMS COME BACK, WORSEN OR NEW PROBLEM DEVELOPS   Please note: You were cared for by a hospitalist during your hospital stay. Every effort will be made to forward records to your primary care provider.  You can request that your primary care provider send for your hospital records if they have not received them.  Once you are discharged, your primary care physician will handle any further medical issues. Please note that NO REFILLS for any discharge medications will be authorized once you are discharged, as it is imperative that you return to your primary care physician (or establish a relationship with a primary care physician if you do not have one) for your post hospital discharge needs so that they can reassess your need for medications and monitor your lab values.  Please get a complete blood count and chemistry panel checked by your Primary MD at your next visit, and again as instructed by your Primary MD.  Get Medicines reviewed and adjusted: Please take all your medications with you for your next visit with your Primary MD  Laboratory/radiological data: Please request your Primary MD to go over all hospital tests and procedure/radiological results at the follow up, please ask your primary care provider to get all Hospital records sent to his/her office.  In some cases, they will be blood work, cultures and biopsy results pending at the time of your discharge. Please request that your primary care provider follow up on these results.  If you are diabetic, please bring your blood sugar readings with you to your follow up appointment with primary care.    Please call and make your follow up appointments as soon as possible.    Also Note the  following: If you experience worsening of your admission symptoms, develop shortness of breath, life threatening emergency, suicidal or homicidal thoughts you must seek medical attention immediately by calling 911 or calling your MD immediately  if symptoms less severe.  You must read complete instructions/literature along with all the possible adverse reactions/side effects for all the Medicines you take and that have been prescribed to you. Take any new Medicines after you have completely understood and accpet all the possible adverse reactions/side effects.   Do not drive when taking Pain medications or sleeping medications (Benzodiazepines)  Do not take more than prescribed Pain, Sleep and Anxiety Medications. It is not advisable to combine anxiety,sleep and pain medications without talking with your primary care practitioner  Special Instructions: If you have smoked or chewed Tobacco  in the last 2 yrs please stop smoking, stop any regular Alcohol  and or any Recreational drug use.  Wear Seat belts while driving.  Do not drive if taking any narcotic, mind altering or controlled substances or recreational drugs or alcohol.

## 2023-07-19 NOTE — Progress Notes (Signed)
Patient ready to go to [facility] today. Room B3 bed1. Attempted to call report x2 Report number is [phone]. Phone ringing without response.

## 2023-07-19 NOTE — Plan of Care (Signed)
Problem: Clinical Measurements: Goal: Will remain free from infection Outcome: Progressing Goal: Diagnostic test results will improve Outcome: Progressing   Problem: Education: Goal: Knowledge of General Education information will improve Description: Including pain rating scale, medication(s)/side effects and non-pharmacologic comfort measures Outcome: Not Progressing

## 2023-07-19 NOTE — Progress Notes (Signed)
Attempted to call report again,  was transfer to voice mail. HIPAA Compliant message was left

## 2023-07-19 NOTE — Progress Notes (Signed)
Nsg Discharge Note  Admit Date:  07/16/2023 Discharge date: 07/19/2023   ANASTACIO RACCA to be D/C'd Skilled nursing facility per MD order.  AVS completed.  Discharge Medication: Allergies as of 07/19/2023       Reactions   Other Other (See Comments)   Patient states he was allergic to something he was given in an IV at the hospital when being treated for pneumonia, but he doesn't remember what the name of the medication was. 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        Medication List     STOP taking these medications    hydrochlorothiazide 25 MG tablet Commonly known as: HYDRODIURIL       TAKE these medications    amLODipine 5 MG tablet Commonly known as: NORVASC Take 1 tablet (5 mg total) by mouth daily. What changed:  medication strength how much to take   cyanocobalamin 1000 MCG tablet Take 1 tablet (1,000 mcg total) by mouth daily. Start taking on: July 24, 2023   Lactulose 20 GM/30ML Soln Take 30 mLs (20 g total) by mouth daily as needed (constipation). What changed:  when to take this reasons to take this   losartan 25 MG tablet Commonly known as: COZAAR Take 1 tablet (25 mg total) by mouth daily. What changed:  medication strength how much to take   Vitamin D (Ergocalciferol) 1.25 MG (50000 UNIT) Caps capsule Commonly known as: DRISDOL Take 1 capsule (50,000 Units total) by mouth every 7 (seven) days. Start taking on: July 25, 2023        Discharge Assessment: Vitals:   07/18/23 2100 07/19/23 0528  BP: 129/72 128/67  Pulse: 60 61  Resp: 18 18  Temp: 98.1 F (36.7 C) 98.4 F (36.9 C)  SpO2: 100% 99%   Skin clean, dry and intact without evidence of skin break down, no evidence of skin tears noted. IV catheter discontinued intact. Site without signs and symptoms of complications - no redness or edema noted at insertion site, patient denies c/o pain - only slight tenderness at site.  Dressing with slight pressure applied.  D/c  Instructions-Education: Discharge instructions given to patient/family with verbalized understanding. D/c education completed with patient/family including follow up instructions, medication list, d/c activities limitations if indicated, with other d/c instructions as indicated by MD - patient able to verbalize understanding, all questions fully answered. Patient instructed to return to ED, call 911, or call MD for any changes in condition.  Patient escorted by EMS to [facility], Tilford Pillar, RN 07/19/2023 1:47 PM

## 2023-07-19 NOTE — TOC Transition Note (Signed)
Transition of Care [facility]) - CM/SW Discharge Note   Patient Details  Name: Kenneth Lester MRN: [id number] Date of Birth: 06/30/1956  Transition [facility] Care [facility]) CM/SW Contact:  Elliot Gault, LCSW Phone Number: 07/19/2023, 11:06 AM   Clinical Narrative:     Pt medically stable for dc per MD. He has SNF auth and [facility] can take pt today. Pt/wife remain agreeable with dc plan.  DC clinical sent electronically. RN to call report. EMS arranged.  No other TOC needs for dc.  Final next level of care: Skilled Nursing Facility Barriers to Discharge: Barriers Resolved   Patient Goals and CMS Choice CMS Medicare.gov Compare Post Acute Care list provided to:: Patient Choice offered to / list presented to : Spouse  Discharge Placement                Patient chooses bed at: Other - please specify in the comment section below: ([facility] [place]) Patient to be transferred to facility by: EMS Name of family member notified: Malachi Bonds Patient and family notified of of transfer: 07/19/23  Discharge Plan and Services Additional resources added to the After Visit Summary for       Post Acute Care Choice: Skilled Nursing Facility                               Social Determinants of Health (SDOH) Interventions SDOH Screenings   Food Insecurity: No Food Insecurity (07/16/2023)  Housing: Low Risk  (07/16/2023)  Transportation Needs: Unmet Transportation Needs (07/16/2023)  Utilities: Not At Risk (07/16/2023)  Tobacco Use: High Risk (07/16/2023)     Readmission Risk Interventions    07/18/2023    2:27 PM  Readmission Risk Prevention Plan  Transportation Screening Complete  PCP or Specialist Appt within 3-5 Days Complete  HRI or Home Care Consult Complete  Social Work Consult for Recovery Care Planning/Counseling Complete  Palliative Care Screening Not Applicable  Medication Review Oceanographer) Complete

## 2023-07-20 DIAGNOSIS — R531 Weakness: Secondary | ICD-10-CM | POA: Diagnosis not present

## 2023-07-20 DIAGNOSIS — I10 Essential (primary) hypertension: Secondary | ICD-10-CM | POA: Diagnosis not present

## 2023-07-20 DIAGNOSIS — E871 Hypo-osmolality and hyponatremia: Secondary | ICD-10-CM | POA: Diagnosis not present

## 2023-07-25 DIAGNOSIS — R531 Weakness: Secondary | ICD-10-CM | POA: Diagnosis not present

## 2023-07-25 DIAGNOSIS — I10 Essential (primary) hypertension: Secondary | ICD-10-CM | POA: Diagnosis not present

## 2023-07-25 DIAGNOSIS — E871 Hypo-osmolality and hyponatremia: Secondary | ICD-10-CM | POA: Diagnosis not present

## 2023-07-26 DIAGNOSIS — E871 Hypo-osmolality and hyponatremia: Secondary | ICD-10-CM | POA: Diagnosis not present

## 2023-07-26 DIAGNOSIS — I10 Essential (primary) hypertension: Secondary | ICD-10-CM | POA: Diagnosis not present

## 2023-07-26 DIAGNOSIS — R531 Weakness: Secondary | ICD-10-CM | POA: Diagnosis not present

## 2023-07-27 DIAGNOSIS — I10 Essential (primary) hypertension: Secondary | ICD-10-CM | POA: Diagnosis not present

## 2023-07-27 DIAGNOSIS — R531 Weakness: Secondary | ICD-10-CM | POA: Diagnosis not present

## 2023-07-27 DIAGNOSIS — E871 Hypo-osmolality and hyponatremia: Secondary | ICD-10-CM | POA: Diagnosis not present

## 2023-07-30 DIAGNOSIS — R531 Weakness: Secondary | ICD-10-CM | POA: Diagnosis not present

## 2023-07-30 DIAGNOSIS — I10 Essential (primary) hypertension: Secondary | ICD-10-CM | POA: Diagnosis not present

## 2023-07-30 DIAGNOSIS — E871 Hypo-osmolality and hyponatremia: Secondary | ICD-10-CM | POA: Diagnosis not present

## 2023-08-01 DIAGNOSIS — E538 Deficiency of other specified B group vitamins: Secondary | ICD-10-CM | POA: Diagnosis not present

## 2023-08-01 DIAGNOSIS — I10 Essential (primary) hypertension: Secondary | ICD-10-CM | POA: Diagnosis not present

## 2023-08-01 DIAGNOSIS — Z556 Problems related to health literacy: Secondary | ICD-10-CM | POA: Diagnosis not present

## 2023-08-01 DIAGNOSIS — F1010 Alcohol abuse, uncomplicated: Secondary | ICD-10-CM | POA: Diagnosis not present

## 2023-08-01 DIAGNOSIS — F17210 Nicotine dependence, cigarettes, uncomplicated: Secondary | ICD-10-CM | POA: Diagnosis not present

## 2023-08-01 DIAGNOSIS — D649 Anemia, unspecified: Secondary | ICD-10-CM | POA: Diagnosis not present

## 2023-08-01 DIAGNOSIS — E871 Hypo-osmolality and hyponatremia: Secondary | ICD-10-CM | POA: Diagnosis not present

## 2023-08-06 DIAGNOSIS — D649 Anemia, unspecified: Secondary | ICD-10-CM | POA: Diagnosis not present

## 2023-08-06 DIAGNOSIS — F1010 Alcohol abuse, uncomplicated: Secondary | ICD-10-CM | POA: Diagnosis not present

## 2023-08-06 DIAGNOSIS — E871 Hypo-osmolality and hyponatremia: Secondary | ICD-10-CM | POA: Diagnosis not present

## 2023-08-06 DIAGNOSIS — F17210 Nicotine dependence, cigarettes, uncomplicated: Secondary | ICD-10-CM | POA: Diagnosis not present

## 2023-08-06 DIAGNOSIS — Z556 Problems related to health literacy: Secondary | ICD-10-CM | POA: Diagnosis not present

## 2023-08-06 DIAGNOSIS — E538 Deficiency of other specified B group vitamins: Secondary | ICD-10-CM | POA: Diagnosis not present

## 2023-08-06 DIAGNOSIS — I10 Essential (primary) hypertension: Secondary | ICD-10-CM | POA: Diagnosis not present

## 2023-08-07 DIAGNOSIS — I10 Essential (primary) hypertension: Secondary | ICD-10-CM | POA: Diagnosis not present

## 2023-08-07 DIAGNOSIS — E871 Hypo-osmolality and hyponatremia: Secondary | ICD-10-CM | POA: Diagnosis not present

## 2023-08-07 DIAGNOSIS — D519 Vitamin B12 deficiency anemia, unspecified: Secondary | ICD-10-CM | POA: Diagnosis not present

## 2023-08-10 DIAGNOSIS — E538 Deficiency of other specified B group vitamins: Secondary | ICD-10-CM | POA: Diagnosis not present

## 2023-08-10 DIAGNOSIS — F17210 Nicotine dependence, cigarettes, uncomplicated: Secondary | ICD-10-CM | POA: Diagnosis not present

## 2023-08-10 DIAGNOSIS — Z556 Problems related to health literacy: Secondary | ICD-10-CM | POA: Diagnosis not present

## 2023-08-10 DIAGNOSIS — D649 Anemia, unspecified: Secondary | ICD-10-CM | POA: Diagnosis not present

## 2023-08-10 DIAGNOSIS — E871 Hypo-osmolality and hyponatremia: Secondary | ICD-10-CM | POA: Diagnosis not present

## 2023-08-10 DIAGNOSIS — I10 Essential (primary) hypertension: Secondary | ICD-10-CM | POA: Diagnosis not present

## 2023-08-10 DIAGNOSIS — F1010 Alcohol abuse, uncomplicated: Secondary | ICD-10-CM | POA: Diagnosis not present

## 2023-08-13 ENCOUNTER — Emergency Department (HOSPITAL_COMMUNITY)
Admission: EM | Admit: 2023-08-13 | Discharge: 2023-08-13 | Disposition: A | Discharge status: Home / Self Care | Payer: Medicare PPO | Attending: Emergency Medicine | Admitting: Emergency Medicine

## 2023-08-13 ENCOUNTER — Encounter (HOSPITAL_COMMUNITY): Payer: Self-pay

## 2023-08-13 ENCOUNTER — Emergency Department (HOSPITAL_COMMUNITY): Payer: Medicare PPO

## 2023-08-13 ENCOUNTER — Other Ambulatory Visit: Payer: Self-pay

## 2023-08-13 DIAGNOSIS — R55 Syncope and collapse: Secondary | ICD-10-CM | POA: Diagnosis not present

## 2023-08-13 DIAGNOSIS — R7309 Other abnormal glucose: Secondary | ICD-10-CM | POA: Diagnosis not present

## 2023-08-13 DIAGNOSIS — R531 Weakness: Secondary | ICD-10-CM | POA: Insufficient documentation

## 2023-08-13 DIAGNOSIS — R42 Dizziness and giddiness: Secondary | ICD-10-CM | POA: Diagnosis not present

## 2023-08-13 DIAGNOSIS — Z79899 Other long term (current) drug therapy: Secondary | ICD-10-CM | POA: Insufficient documentation

## 2023-08-13 DIAGNOSIS — I10 Essential (primary) hypertension: Secondary | ICD-10-CM | POA: Diagnosis not present

## 2023-08-13 LAB — BASIC METABOLIC PANEL
Anion gap: 8 (ref 5–15)
BUN: 6 mg/dL — ABNORMAL LOW (ref 8–23)
CO2: 24 mmol/L (ref 22–32)
Calcium: 9.5 mg/dL (ref 8.9–10.3)
Chloride: 102 mmol/L (ref 98–111)
Creatinine, Ser: 0.6 mg/dL — ABNORMAL LOW (ref 0.61–1.24)
GFR, Estimated: >60 mL/min (ref >60)
Glucose, Bld: 124 mg/dL — ABNORMAL HIGH (ref 70–99)
Potassium: 3.4 mmol/L — ABNORMAL LOW (ref 3.5–5.1)
Sodium: 134 mmol/L — ABNORMAL LOW (ref 135–145)

## 2023-08-13 LAB — URINALYSIS, ROUTINE W REFLEX MICROSCOPIC
Bilirubin Urine: NEGATIVE
Glucose, UA: NEGATIVE mg/dL
Hgb urine dipstick: NEGATIVE
Ketones, ur: 5 mg/dL — AB
Leukocytes,Ua: NEGATIVE
Nitrite: NEGATIVE
Protein, ur: NEGATIVE mg/dL
Specific Gravity, Urine: 1.009 (ref 1.005–1.030)
pH: 7 (ref 5.0–8.0)

## 2023-08-13 LAB — ETHANOL: Alcohol, Ethyl (B): <10 mg/dL (ref <10)

## 2023-08-13 LAB — HEPATIC FUNCTION PANEL
ALT: 18 U/L (ref 0–44)
AST: 17 U/L (ref 15–41)
Albumin: 4.5 g/dL (ref 3.5–5.0)
Alkaline Phosphatase: 69 U/L (ref 38–126)
Bilirubin, Direct: 0.1 mg/dL (ref 0.0–0.2)
Indirect Bilirubin: 0.4 mg/dL (ref 0.3–0.9)
Total Bilirubin: 0.5 mg/dL (ref 0.3–1.2)
Total Protein: 8 g/dL (ref 6.5–8.1)

## 2023-08-13 LAB — TROPONIN I (HIGH SENSITIVITY)
Troponin I (High Sensitivity): 2 ng/L (ref <18)
Troponin I (High Sensitivity): 3 ng/L (ref <18)

## 2023-08-13 LAB — CBC
HCT: 40.8 % (ref 39.0–52.0)
Hemoglobin: 13.9 g/dL (ref 13.0–17.0)
MCH: 30.9 pg (ref 26.0–34.0)
MCHC: 34.1 g/dL (ref 30.0–36.0)
MCV: 90.7 fL (ref 80.0–100.0)
Platelets: 287 10*3/uL (ref 150–400)
RBC: 4.5 MIL/uL (ref 4.22–5.81)
RDW: 13.6 % (ref 11.5–15.5)
WBC: 7.3 10*3/uL (ref 4.0–10.5)
nRBC: 0 % (ref 0.0–0.2)

## 2023-08-13 NOTE — ED Triage Notes (Signed)
BIBA from home for c/o weakness x "months" Cbg- 102.  Patient denies cp, sob, dizziness.  Hx of ETOH usage.  Ambulates with cane at baseline per ems.

## 2023-08-13 NOTE — ED Notes (Signed)
Pt reports he is not sure how he is getting home because I woke him up too soon so he can not think about it.  Pt wife contacted and she stated she will pick him up.

## 2023-08-13 NOTE — Discharge Instructions (Signed)
Follow-up with your doctor this week for recheck. 

## 2023-08-13 NOTE — ED Provider Notes (Signed)
[place] EMERGENCY DEPARTMENT AT [facility] Provider Note   CSN: [id number] Arrival date & time: 08/13/23  1141     History {Add pertinent medical, surgical, social history, OB history to HPI:1} Chief Complaint  Patient presents with   Weakness    Kenneth Lester is a 67 y.o. male.  Patient has a history of EtOH abuse.  Patient complains of weakness   Weakness      Home Medications Prior to Admission medications   Medication Sig Start Date End Date Taking? Authorizing Provider  amLODipine (NORVASC) 5 MG tablet Take 1 tablet (5 mg total) by mouth daily. 07/19/23   Johnson, Clanford L, MD  cyanocobalamin 1000 MCG tablet Take 1 tablet (1,000 mcg total) by mouth daily. 07/24/23   Johnson, Clanford L, MD  Lactulose 20 GM/30ML SOLN Take 30 mLs (20 g total) by mouth daily as needed (constipation). 07/19/23   Johnson, Clanford L, MD  losartan (COZAAR) 25 MG tablet Take 1 tablet (25 mg total) by mouth daily. 07/19/23   Johnson, Clanford L, MD  Vitamin D, Ergocalciferol, (DRISDOL) 1.25 MG (50000 UNIT) CAPS capsule Take 1 capsule (50,000 Units total) by mouth every 7 (seven) days. 07/25/23 08/24/23  Cleora Fleet, MD      Allergies    Other    Review of Systems   Review of Systems  Neurological:  Positive for weakness.    Physical Exam Updated Vital Signs BP (!) 147/87   Pulse 74   Temp 98.2 F (36.8 C) (Oral)   Resp 18   Wt 61 kg   SpO2 98%   BMI 20.45 kg/m  Physical Exam  ED Results / Procedures / Treatments   Labs (all labs ordered are listed, but only abnormal results are displayed) Labs Reviewed  BASIC METABOLIC PANEL - Abnormal; Notable for the following components:      Result Value   Sodium 134 (*)    Potassium 3.4 (*)    Glucose, Bld 124 (*)    BUN 6 (*)    Creatinine, Ser 0.60 (*)    All other components within normal limits  URINALYSIS, ROUTINE W REFLEX MICROSCOPIC - Abnormal; Notable for the following components:   Ketones, ur 5 (*)     All other components within normal limits  CBC  ETHANOL  HEPATIC FUNCTION PANEL  TROPONIN I (HIGH SENSITIVITY)  TROPONIN I (HIGH SENSITIVITY)    EKG None  Radiology CT Head Wo Contrast  Result Date: 08/13/2023 CLINICAL DATA:  Dizziness, Syncope/presyncope, cerebrovascular cause suspected EXAM: CT HEAD WITHOUT CONTRAST TECHNIQUE: Contiguous axial images were obtained from the base of the skull through the vertex without intravenous contrast. RADIATION DOSE REDUCTION: This exam was performed according to the departmental dose-optimization program which includes automated exposure control, adjustment of the mA and/or kV according to patient size and/or use of iterative reconstruction technique. COMPARISON:  MRI 05/22/2023, CT 04/30/2023 FINDINGS: Brain: Mild parenchymal volume loss is again seen globally, stable since prior examination. 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 No evidence of acute intracranial hemorrhage or infarct. No abnormal mass effect or midline shift. No abnormal intra or extra-axial mass lesion. Ventricular size is normal. Cerebellum is unremarkable. Vascular: No hyperdense vessel or unexpected calcification. Skull: Normal. Negative for fracture or focal lesion.  Sinuses/Orbits: No acute finding. Other: Mastoid air cells and middle ear cavities are clear. IMPRESSION: 1. No acute intracranial abnormality. 2. 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 Electronically Signed   By: Helyn Numbers M.D.   On: 08/13/2023 19:47    Procedures Procedures  {Document cardiac monitor, telemetry assessment procedure when appropriate:1}  Medications Ordered in ED Medications - No data to display  ED Course/ Medical Decision Making/ A&P   {   Click here for ABCD2, HEART and other calculatorsREFRESH Note before signing :1}                              Medical Decision Making Amount and/or Complexity of Data Reviewed Labs: ordered.   Patient with weakness.  Labs unremarkable and vital signs normal.  He will follow-up with PCP  {Document critical care time when appropriate:1} {Document review of labs and clinical decision tools ie heart score, Chads2Vasc2 etc:1}  {Document your independent review of radiology images, and any outside records:1} {Document your discussion with family members, caretakers, and with consultants:1} {Document social determinants of health affecting pt's care:1} {Document your decision making why or why not admission, treatments were needed:1} Final Clinical Impression(s) / ED Diagnoses Final diagnoses:  Weakness    Rx / DC Orders ED Discharge Orders     None

## 2023-08-13 NOTE — ED Provider Triage Note (Signed)
Emergency Medicine Provider Triage Evaluation Note  Kenneth Lester , a 67 y.o. male  was evaluated in triage.  Pt complains of episode of near syncope today.  Patient reports that and have amount of her he stated up then felt he was in a pass out however did not.  He did not have any chest pain or shortness of breath during this.  He did not have any headache or any visual changes.  He reports he has chronic weakness on the righht from an injury years ago.  This is at his baseline.  Review of Systems  Positive:  Negative:   Physical Exam  BP (!) 147/87   Pulse 74   Temp 98.2 F (36.8 C) (Oral)   Resp 18   Wt 61 kg   SpO2 98%   BMI 20.45 kg/m  Gen:   Awake, no distress   Resp:  Normal effort  MSK:   Moves extremities without difficulty  Other:  Does have some weakness in the lower right extremity which patient reports is at his baseline.  Cranial nerves grossly intact.  Patient answering questions appropriately.  Medical Decision Making  Medically screening exam initiated at 4:21 PM.  Appropriate orders placed.  Kenneth Lester was informed that the remainder of the evaluation will be completed by another provider, this initial triage assessment does not replace that evaluation, and the importance of remaining in the ED until their evaluation is complete.  I see the patient was recently admitted for hyponatremia.  Labs and CT head ordered.   Kenneth Lester, [place] 08/13/23 1623

## 2023-08-17 DIAGNOSIS — E871 Hypo-osmolality and hyponatremia: Secondary | ICD-10-CM | POA: Diagnosis not present

## 2023-08-17 DIAGNOSIS — D649 Anemia, unspecified: Secondary | ICD-10-CM | POA: Diagnosis not present

## 2023-08-17 DIAGNOSIS — E538 Deficiency of other specified B group vitamins: Secondary | ICD-10-CM | POA: Diagnosis not present

## 2023-08-17 DIAGNOSIS — F17210 Nicotine dependence, cigarettes, uncomplicated: Secondary | ICD-10-CM | POA: Diagnosis not present

## 2023-08-17 DIAGNOSIS — Z556 Problems related to health literacy: Secondary | ICD-10-CM | POA: Diagnosis not present

## 2023-08-17 DIAGNOSIS — I10 Essential (primary) hypertension: Secondary | ICD-10-CM | POA: Diagnosis not present

## 2023-08-17 DIAGNOSIS — F1010 Alcohol abuse, uncomplicated: Secondary | ICD-10-CM | POA: Diagnosis not present

## 2023-08-22 DIAGNOSIS — F17210 Nicotine dependence, cigarettes, uncomplicated: Secondary | ICD-10-CM | POA: Diagnosis not present

## 2023-08-22 DIAGNOSIS — Z556 Problems related to health literacy: Secondary | ICD-10-CM | POA: Diagnosis not present

## 2023-08-22 DIAGNOSIS — F1010 Alcohol abuse, uncomplicated: Secondary | ICD-10-CM | POA: Diagnosis not present

## 2023-08-22 DIAGNOSIS — I10 Essential (primary) hypertension: Secondary | ICD-10-CM | POA: Diagnosis not present

## 2023-08-22 DIAGNOSIS — E871 Hypo-osmolality and hyponatremia: Secondary | ICD-10-CM | POA: Diagnosis not present

## 2023-08-22 DIAGNOSIS — E538 Deficiency of other specified B group vitamins: Secondary | ICD-10-CM | POA: Diagnosis not present

## 2023-08-22 DIAGNOSIS — D649 Anemia, unspecified: Secondary | ICD-10-CM | POA: Diagnosis not present

## 2023-08-23 ENCOUNTER — Emergency Department (HOSPITAL_COMMUNITY): Payer: Medicare PPO

## 2023-08-23 ENCOUNTER — Observation Stay (HOSPITAL_COMMUNITY)
Admission: EM | Admit: 2023-08-23 | Discharge: 2023-08-27 | Disposition: A | Discharge status: Skilled Nursing Facility | Payer: Medicare PPO | Attending: Internal Medicine | Admitting: Internal Medicine

## 2023-08-23 ENCOUNTER — Other Ambulatory Visit: Payer: Self-pay

## 2023-08-23 ENCOUNTER — Encounter (HOSPITAL_COMMUNITY): Payer: Self-pay | Admitting: Family Medicine

## 2023-08-23 DIAGNOSIS — I10 Essential (primary) hypertension: Secondary | ICD-10-CM | POA: Diagnosis not present

## 2023-08-23 DIAGNOSIS — R531 Weakness: Secondary | ICD-10-CM

## 2023-08-23 DIAGNOSIS — F17200 Nicotine dependence, unspecified, uncomplicated: Secondary | ICD-10-CM | POA: Diagnosis not present

## 2023-08-23 DIAGNOSIS — R42 Dizziness and giddiness: Secondary | ICD-10-CM

## 2023-08-23 DIAGNOSIS — F17210 Nicotine dependence, cigarettes, uncomplicated: Secondary | ICD-10-CM | POA: Diagnosis not present

## 2023-08-23 DIAGNOSIS — Z79899 Other long term (current) drug therapy: Secondary | ICD-10-CM | POA: Insufficient documentation

## 2023-08-23 DIAGNOSIS — E871 Hypo-osmolality and hyponatremia: Secondary | ICD-10-CM | POA: Diagnosis not present

## 2023-08-23 LAB — CBC WITH DIFFERENTIAL/PLATELET
Abs Immature Granulocytes: 0.08 10*3/uL — ABNORMAL HIGH (ref 0.00–0.07)
Basophils Absolute: 0 10*3/uL (ref 0.0–0.1)
Basophils Relative: 0 %
Eosinophils Absolute: 0.5 10*3/uL (ref 0.0–0.5)
Eosinophils Relative: 4 %
HCT: 38.5 % — ABNORMAL LOW (ref 39.0–52.0)
Hemoglobin: 13.1 g/dL (ref 13.0–17.0)
Immature Granulocytes: 1 %
Lymphocytes Relative: 12 %
Lymphs Abs: 1.4 10*3/uL (ref 0.7–4.0)
MCH: 31 pg (ref 26.0–34.0)
MCHC: 34 g/dL (ref 30.0–36.0)
MCV: 91 fL (ref 80.0–100.0)
Monocytes Absolute: 1.1 10*3/uL — ABNORMAL HIGH (ref 0.1–1.0)
Monocytes Relative: 10 %
Neutro Abs: 8.3 10*3/uL — ABNORMAL HIGH (ref 1.7–7.7)
Neutrophils Relative %: 73 %
Platelets: 330 10*3/uL (ref 150–400)
RBC: 4.23 MIL/uL (ref 4.22–5.81)
RDW: 13.6 % (ref 11.5–15.5)
WBC: 11.4 10*3/uL — ABNORMAL HIGH (ref 4.0–10.5)
nRBC: 0 % (ref 0.0–0.2)

## 2023-08-23 LAB — BASIC METABOLIC PANEL
Anion gap: 8 (ref 5–15)
Anion gap: 7 (ref 5–15)
BUN: 6 mg/dL — ABNORMAL LOW (ref 8–23)
BUN: 5 mg/dL — ABNORMAL LOW (ref 8–23)
CO2: 24 mmol/L (ref 22–32)
CO2: 22 mmol/L (ref 22–32)
Calcium: 9.4 mg/dL (ref 8.9–10.3)
Calcium: 9 mg/dL (ref 8.9–10.3)
Chloride: 95 mmol/L — ABNORMAL LOW (ref 98–111)
Chloride: 101 mmol/L (ref 98–111)
Creatinine, Ser: 0.5 mg/dL — ABNORMAL LOW (ref 0.61–1.24)
Creatinine, Ser: 0.43 mg/dL — ABNORMAL LOW (ref 0.61–1.24)
GFR, Estimated: >60 mL/min (ref >60)
GFR, Estimated: >60 mL/min (ref >60)
Glucose, Bld: 100 mg/dL — ABNORMAL HIGH (ref 70–99)
Glucose, Bld: 97 mg/dL (ref 70–99)
Potassium: 3.9 mmol/L (ref 3.5–5.1)
Potassium: 4.3 mmol/L (ref 3.5–5.1)
Sodium: 127 mmol/L — ABNORMAL LOW (ref 135–145)
Sodium: 130 mmol/L — ABNORMAL LOW (ref 135–145)

## 2023-08-23 LAB — MAGNESIUM: Magnesium: 2.2 mg/dL (ref 1.7–2.4)

## 2023-08-23 MED ORDER — SODIUM CHLORIDE 0.9 % IV SOLN: INTRAVENOUS | Status: DC

## 2023-08-23 MED ORDER — LOSARTAN POTASSIUM 50 MG PO TABS
25.0000 mg | ORAL_TABLET | Freq: Every day | ORAL | Status: DC
  Administered 2023-08-24 – 2023-08-27 (×4): 25 mg via ORAL
  Filled 2023-08-23 (×4): qty 1

## 2023-08-23 MED ORDER — ACETAMINOPHEN 650 MG RE SUPP
650.0000 mg | Freq: Four times a day (QID) | RECTAL | Status: DC | PRN
Start: 2023-08-23 — End: 2023-08-27

## 2023-08-23 MED ORDER — SODIUM CHLORIDE 0.9 % IV BOLUS
1000.0000 mL | Freq: Once | INTRAVENOUS | Status: AC
  Administered 2023-08-23: 1000 mL via INTRAVENOUS

## 2023-08-23 MED ORDER — OXYCODONE HCL 5 MG PO TABS: 5.0000 mg | ORAL_TABLET | ORAL | Status: DC | PRN

## 2023-08-23 MED ORDER — ONDANSETRON HCL 4 MG PO TABS: 4.0000 mg | ORAL_TABLET | Freq: Four times a day (QID) | ORAL | Status: DC | PRN

## 2023-08-23 MED ORDER — ACETAMINOPHEN 325 MG PO TABS: 650.0000 mg | ORAL_TABLET | Freq: Four times a day (QID) | ORAL | Status: DC | PRN

## 2023-08-23 MED ORDER — AMLODIPINE BESYLATE 5 MG PO TABS
5.0000 mg | ORAL_TABLET | Freq: Every day | ORAL | Status: DC
  Administered 2023-08-24 – 2023-08-27 (×4): 5 mg via ORAL
  Filled 2023-08-23 (×4): qty 1

## 2023-08-23 MED ORDER — HEPARIN SODIUM (PORCINE) 5000 UNIT/ML IJ SOLN
5000.0000 [IU] | Freq: Three times a day (TID) | INTRAMUSCULAR | Status: DC
  Administered 2023-08-23 – 2023-08-27 (×7): 5000 [IU] via SUBCUTANEOUS
  Filled 2023-08-23 (×7): qty 1

## 2023-08-23 MED ORDER — ONDANSETRON HCL 4 MG/2ML IJ SOLN: 4.0000 mg | Freq: Four times a day (QID) | INTRAMUSCULAR | Status: DC | PRN

## 2023-08-23 MED ORDER — SODIUM CHLORIDE 1 G PO TABS
1.0000 g | ORAL_TABLET | Freq: Two times a day (BID) | ORAL | Status: DC
  Administered 2023-08-24 – 2023-08-25 (×3): 1 g via ORAL
  Filled 2023-08-23 (×3): qty 1

## 2023-08-23 MED ORDER — LACTULOSE 10 GM/15ML PO SOLN: 20.0000 g | Freq: Every day | ORAL | Status: DC | PRN

## 2023-08-23 MED ORDER — MORPHINE SULFATE (PF) 2 MG/ML IV SOLN: 2.0000 mg | INTRAVENOUS | Status: DC | PRN

## 2023-08-23 NOTE — ED Provider Triage Note (Signed)
Emergency Medicine Provider Triage Evaluation Note  Kenneth Lester , a 67 y.o. male  was evaluated in triage.  Pt complains of dizziness.  Patient states that he suffers from this chronically but intermittently.  He states this episode started this morning.  He denies any lightheadedness or room spinning dizziness but feels unsteady on his feet.  He denies any focal weakness or numbness.  He denies any trouble talking.  Review of Systems  Positive:  Negative: See above  Physical Exam  BP 134/85 (BP Location: Right Arm)   Pulse 77   Temp 98.1 F (36.7 C)   Resp 19   Ht 5\' 8"  (1.727 m)   Wt 60.8 kg   SpO2 98%   BMI 20.37 kg/m  Gen:   Awake, no distress   Resp:  Normal effort  MSK:   Moves extremities without difficulty  Other:  Cranial nerves II through XII are intact.  5/5 strength to the upper and lower extremities.  Normal sensation to the upper and lower extremities.  Speech is normal.  Medical Decision Making  Medically screening exam initiated at 1:01 PM.  Appropriate orders placed.  BLUFORD KUBES was informed that the remainder of the evaluation will be completed by another provider, this initial triage assessment does not replace that evaluation, and the importance of remaining in the ED until their evaluation is complete.     Honor Loh [place], [place] 08/23/23 [phone]

## 2023-08-23 NOTE — ED Triage Notes (Signed)
Pt arrived via [facility] from home, stated dizziness started this morning, states "feels like I am drunk but not drunk like that bc I havent consumed any alcohol" states this dizziness has occurred before. Per EMS, cbg 119

## 2023-08-23 NOTE — ED Notes (Signed)
Patient requesting something to eat.  MD made aware.

## 2023-08-23 NOTE — ED Notes (Signed)
MD notified, Patient pulled out IV and not sure how much fluid patient received.

## 2023-08-23 NOTE — Assessment & Plan Note (Signed)
-   Smokes 1 pack/day - Declines nicotine patch at this time

## 2023-08-23 NOTE — Assessment & Plan Note (Signed)
-   Likely related to acute hyponatremia - PT eval and treat - See plan for acute hyponatremia

## 2023-08-23 NOTE — Assessment & Plan Note (Signed)
Continue Cozaar and Norvasc

## 2023-08-23 NOTE — Assessment & Plan Note (Addendum)
-   Acute hyponatremia likely contributing - History is consistent with orthostatic hypotension - Check orthostatic vitals - Continue with IV hydration

## 2023-08-23 NOTE — ED Notes (Signed)
Food tray given to patient 

## 2023-08-23 NOTE — ED Notes (Signed)
Pt was able to use the bathroom after being wheeled into bathroom. Able to stand up on his own.

## 2023-08-23 NOTE — Progress Notes (Signed)
Pt irritable and argumentative with multiple staff members. Pt refused full skin assessment. Pt stated "I am tired of these heparin shots, I am only taking it tonight and that's it and I don't know why ya'll keep asking me the same questions every time I'm here." Pt educated on the importance of medication adherence and the admission process was fully explained to the patient.

## 2023-08-23 NOTE — Assessment & Plan Note (Signed)
-   Sodium 1 down to 127 - 10 days ago sodium was 134 - After 1 L saline sodium is now 130 - Continue IV hydration - Patient has dizziness and generalized weakness associated - May need to be discharged on sodium tabs

## 2023-08-23 NOTE — H&P (Signed)
History and Physical    Patient: Kenneth Lester:[id number] DOB: May 21, 1956 DOA: 08/23/2023 DOS: the patient was seen and examined on 08/23/2023 PCP: Kenneth Perches, MD  Patient coming from: Home  Chief Complaint:  Chief Complaint  Patient presents with   Dizziness   HPI: Kenneth Lester is a 67 y.o. male with medical history significant of history of alcohol abuse, declines alcohol use at this time, hypertension, tobacco use disorder, and more presents to the ED with a chief complaint of dizziness.  Patient reports that he feels unsteady.  It is especially when he goes from sitting to standing.  Patient describes it as feeling unsteady on his feet like he is going to fall down.  He is not actually had a fall because he stayed in the house and close to the walls.  Patient reports he has no trouble with dizziness when he sitting or laying.  Reports the dizziness hits him about 10-15 seconds after he is stood up.  He denies any chest pain, palpitations, dyspnea, headache.  Patient reports that he has had decreased p.o. intake because of limited access to food.  He reports that he feels like he is going to fall he cannot be "hunting food."  Patient denies any nausea or vomiting.  He has no other complaints at this time.  Patient does smoke a pack per day he declines nicotine patch at this time.  Patient does not drink alcohol.  Patient is full code. Review of Systems: As mentioned in the history of present illness. All other systems reviewed and are negative. Past Medical History:  Diagnosis Date   Alcohol abuse    Hypertension    Use of cane as ambulatory aid    Vertigo    Past Surgical History:  Procedure Laterality Date   COLONOSCOPY  2009   Dr. Jena Gauss: difficult prep (patient ate solid food during prep). single hyperplastic polyps removed.   MOUTH SURGERY     teeth pulled for dentures, pt unsure of date   ORIF WRIST FRACTURE Left 09/22/2021   Procedure: OPEN REDUCTION INTERNAL  FIXATION (ORIF) WRIST FRACTURE;  Surgeon: Gomez Cleverly, MD;  Location: [facility] [place];  Service: Orthopedics;  Laterality: Left;  with MAC   right ankle surgery  2006   forklift injury   Social History:  reports that he has been smoking cigarettes. He has a 50 pack-year smoking history. He has never used smokeless tobacco. He reports that he does not currently use alcohol after a past usage of about 7.0 standard drinks of alcohol per week. He reports that he does not use drugs.  Allergies  Allergen Reactions   Other Other (See Comments)    Patient states he was allergic to something he was given in an IV at the hospital when being treated for pneumonia, but he doesn't remember what the name of the medication was. 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    Family History  Problem Relation Age of Onset   Colon cancer Neg Hx     Prior to Admission medications   Medication Sig Start Date End Date Taking? Authorizing Provider  amLODipine (NORVASC) 5 MG tablet Take 1 tablet (5 mg total) by mouth daily. 07/19/23  Yes Johnson, Clanford L, MD  cyanocobalamin 1000 MCG tablet Take 1 tablet (1,000 mcg total) by mouth daily. 07/24/23  Yes Johnson, Clanford L, MD  Lactulose 20 GM/30ML SOLN Take 30 mLs (20 g total) by mouth daily as needed (  constipation). 07/19/23  Yes Johnson, Clanford L, MD  losartan (COZAAR) 25 MG tablet Take 1 tablet (25 mg total) by mouth daily. 07/19/23  Yes Johnson, Clanford L, MD  Vitamin D, Ergocalciferol, (DRISDOL) 1.25 MG (50000 UNIT) CAPS capsule Take 1 capsule (50,000 Units total) by mouth every 7 (seven) days. 07/25/23 08/24/23 Yes Cleora Fleet, MD    Physical Exam: Vitals:   08/23/23 1845 08/23/23 2203 08/23/23 2242 08/23/23 2248  BP: (!) 162/84 (!) 156/82  111/62  Pulse:  78  67  Resp: 16 15  16   Temp:  98.1 F (36.7 C)  98.2 F (36.8 C)  TempSrc:  Oral  Oral  SpO2:  100% 99% 99%  Weight:      Height:       1.  General: Patient lying  supine in bed,  no acute distress   2. Psychiatric: Alert and oriented x 3, mood and behavior normal for situation, pleasant and cooperative with exam   3. Neurologic: Speech and language are normal, face is symmetric, moves all 4 extremities voluntarily, at baseline without acute deficits on limited exam   4. HEENMT:  Head is atraumatic, normocephalic, pupils reactive to light, neck is supple, trachea is midline, mucous membranes are moist   5. Respiratory : Lungs are clear to auscultation bilaterally without wheezing, rhonchi, rales, no cyanosis, no increase in work of breathing or accessory muscle use   6. Cardiovascular : Heart rate normal, rhythm is regular, no murmurs, rubs or gallops, no peripheral edema, peripheral pulses palpated   7. Gastrointestinal:  Abdomen is soft, nondistended, nontender to palpation bowel sounds active, no masses or organomegaly palpated   8. Skin:  Skin is warm, dry and intact without rashes, acute lesions, or ulcers on limited exam   9.Musculoskeletal:  No acute deformities or trauma, no asymmetry in tone, no peripheral edema, peripheral pulses palpated, no tenderness to palpation in the extremities  Data Reviewed: In the ED Vitals are unremarkable Labs are unremarkable aside from a sodium 127 Trope 3, 2 CT head showed no acute abnormality Patient was given 1 L normal saline Admission was requested for acute hyponatremia Assessment and Plan: * Acute hyponatremia - Sodium 1 down to 127 - 10 days ago sodium was 134 - After 1 L saline sodium is now 130 - Continue IV hydration - Patient has dizziness and generalized weakness associated - May need to be discharged on sodium tabs  Generalized weakness - Likely related to acute hyponatremia - PT eval and treat - See plan for acute hyponatremia  Essential hypertension - Continue Cozaar and Norvasc  Tobacco use disorder - Smokes 1 pack/day - Declines nicotine patch at this time       Advance Care Planning:   Code Status: Full Code   Consults: None at this time  Family Communication: No family at bedside  Severity of Illness: The appropriate patient status for this patient is OBSERVATION. Observation status is judged to be reasonable and necessary in order to provide the required intensity of service to ensure the patient's safety. The patient's presenting symptoms, physical exam findings, and initial radiographic and laboratory data in the context of their medical condition is felt to place them at decreased risk for further clinical deterioration. Furthermore, it is anticipated that the patient will be medically stable for discharge from the hospital within 2 midnights of admission.   Author: Lilyan Gilford, DO 08/23/2023 10:56 PM  For on call review [email or web address].

## 2023-08-23 NOTE — ED Provider Notes (Signed)
[place] EMERGENCY DEPARTMENT AT [facility] Provider Note   CSN: [id number] Arrival date & time: 08/23/23  1114     History Chief Complaint  Patient presents with   Dizziness    Kenneth Lester is a 67 y.o. male pt complains of dizziness. Patient states that he suffers from this chronically but intermittently. He states this episode started this morning. He denies any lightheadedness or room spinning dizziness but feels unsteady on his feet. He denies any focal weakness or numbness. He denies any trouble talking.    Dizziness      Home Medications Prior to Admission medications   Medication Sig Start Date End Date Taking? Authorizing Provider  amLODipine (NORVASC) 5 MG tablet Take 1 tablet (5 mg total) by mouth daily. 07/19/23   Johnson, Clanford L, MD  cyanocobalamin 1000 MCG tablet Take 1 tablet (1,000 mcg total) by mouth daily. 07/24/23   Johnson, Clanford L, MD  Lactulose 20 GM/30ML SOLN Take 30 mLs (20 g total) by mouth daily as needed (constipation). 07/19/23   Johnson, Clanford L, MD  losartan (COZAAR) 25 MG tablet Take 1 tablet (25 mg total) by mouth daily. 07/19/23   Johnson, Clanford L, MD  Vitamin D, Ergocalciferol, (DRISDOL) 1.25 MG (50000 UNIT) CAPS capsule Take 1 capsule (50,000 Units total) by mouth every 7 (seven) days. 07/25/23 08/24/23  Cleora Fleet, MD      Allergies    Other    Review of Systems   Review of Systems  Neurological:  Positive for dizziness.  All other systems reviewed and are negative.   Physical Exam Updated Vital Signs BP (!) 158/113   Pulse 72   Temp 98 F (36.7 C) (Oral)   Resp 17   Ht 5\' 8"  (1.727 m)   Wt 60.8 kg   SpO2 98%   BMI 20.37 kg/m  Physical Exam Vitals and nursing note reviewed.  Constitutional:      General: He is not in acute distress.    Appearance: Normal appearance.  HENT:     Head: Normocephalic and atraumatic.  Eyes:     General:        Right eye: No discharge.        Left eye: No  discharge.  Cardiovascular:     Comments: Regular rate and rhythm.  S1/S2 are distinct without any evidence of murmur, rubs, or gallops.  Radial pulses are 2+ bilaterally.  Dorsalis pedis pulses are 2+ bilaterally.  No evidence of pedal edema. Pulmonary:     Comments: Clear to auscultation bilaterally.  Normal effort.  No respiratory distress.  No evidence of wheezes, rales, or rhonchi heard throughout. Abdominal:     General: Abdomen is flat. Bowel sounds are normal. There is no distension.     Tenderness: There is no abdominal tenderness. There is no guarding or rebound.  Musculoskeletal:        General: Normal range of motion.     Cervical back: Neck supple.  Skin:    General: Skin is warm and dry.     Findings: No rash.  Neurological:     General: No focal deficit present.     Mental Status: He is alert.     Comments: Cranial nerves II through XII are intact. EOMI.  5/5 strength to the upper and Lester extremities.  Normal sensation to the upper and Lester extremities.  No dysmetria with finger-to-nose.  Speech is normal.  Psychiatric:        Mood  and Affect: Mood normal.        Behavior: Behavior normal.     ED Results / Procedures / Treatments   Labs (all labs ordered are listed, but only abnormal results are displayed) Labs Reviewed  CBC WITH DIFFERENTIAL/PLATELET - Abnormal; Notable for the following components:      Result Value   WBC 11.4 (*)    HCT 38.5 (*)    Neutro Abs 8.3 (*)    Monocytes Absolute 1.1 (*)    Abs Immature Granulocytes 0.08 (*)    All other components within normal limits  BASIC METABOLIC PANEL - Abnormal; Notable for the following components:   Sodium 127 (*)    Chloride 95 (*)    Glucose, Bld 100 (*)    BUN 6 (*)    Creatinine, Ser 0.50 (*)    All other components within normal limits  BASIC METABOLIC PANEL    EKG None  Radiology CT Head Wo Contrast  Result Date: 08/23/2023 CLINICAL DATA:  Vertigo, central EXAM: CT HEAD WITHOUT  CONTRAST TECHNIQUE: Contiguous axial images were obtained from the base of the skull through the vertex without intravenous contrast. RADIATION DOSE REDUCTION: This exam was performed according to the departmental dose-optimization program which includes automated exposure control, adjustment of the mA and/or kV according to patient size and/or use of iterative reconstruction technique. COMPARISON:  CT head October 21, 24. FINDINGS: Brain: No evidence of acute infarction, hemorrhage, hydrocephalus, extra-axial collection or mass lesion/mass effect. Patchy white matter and deep gray hypodensities are nonspecific but compatible with chronic microvascular ischemic disease. Vascular: No hyperdense vessel. Skull: No acute fracture. Sinuses/Orbits: Mostly clear sinuses.  No acute orbital findings. Other: No mastoid effusions. IMPRESSION: 1. No evidence of acute intracranial abnormality. 2. Similar advanced chronic microvascular ischemic disease, further characterized on prior MRI. Electronically Signed   By: Feliberto Harts M.D.   On: 08/23/2023 16:31    Procedures Procedures    Medications Ordered in ED Medications  sodium chloride 0.9 % bolus 1,000 mL (1,000 mLs Intravenous Bolus 08/23/23 1701)    ED Course/ Medical Decision Making/ A&P Clinical Course as of 08/23/23 1804  Thu Aug 23, 2023  1741 Basic metabolic panel(!) Patient found to be significantly hyponatremic which is likely the cause of his dizziness. [CF]  1741 CBC with Differential(!) Mild leukocytosis.  [CF]  1802 I spoke with Dr. Carren Rang with [facility] who agrees to admit the patient. [CF]    Clinical Course User Index [CF] Kenneth Lower, PA-C   {   Click here for ABCD2, HEART and other calculators  Medical Decision Making Kenneth Lester is a 67 y.o. male patient who presents to the emergency department today for further evaluation of dizziness and unsteadiness on his feet.  I have a low suspicion for stroke at  this time.  Patient found to be hyponatremic with labs drawn in triage.  This is likely the source of his symptoms.  Will plan to get the patient admitted for symptomatic hyponatremia.  Hospitalist agrees to admit the patient.  He is stable for admission.  Amount and/or Complexity of Data Reviewed Labs: ordered. Decision-making details documented in ED Course. Radiology: ordered.  Risk Decision regarding hospitalization.    Final Clinical Impression(s) / ED Diagnoses Final diagnoses:  Hyponatremia    Rx / DC Orders ED Discharge Orders     None         Jolyn Lent 08/23/23 1804    Glendora Score, MD  08/24/23 0141  

## 2023-08-24 DIAGNOSIS — E871 Hypo-osmolality and hyponatremia: Secondary | ICD-10-CM | POA: Diagnosis not present

## 2023-08-24 LAB — CBC WITH DIFFERENTIAL/PLATELET
Abs Immature Granulocytes: 0.04 10*3/uL (ref 0.00–0.07)
Basophils Absolute: 0 10*3/uL (ref 0.0–0.1)
Basophils Relative: 0 %
Eosinophils Absolute: 0.3 10*3/uL (ref 0.0–0.5)
Eosinophils Relative: 4 %
HCT: 34.4 % — ABNORMAL LOW (ref 39.0–52.0)
Hemoglobin: 11.9 g/dL — ABNORMAL LOW (ref 13.0–17.0)
Immature Granulocytes: 1 %
Lymphocytes Relative: 15 %
Lymphs Abs: 1.1 10*3/uL (ref 0.7–4.0)
MCH: 30.9 pg (ref 26.0–34.0)
MCHC: 34.6 g/dL (ref 30.0–36.0)
MCV: 89.4 fL (ref 80.0–100.0)
Monocytes Absolute: 0.8 10*3/uL (ref 0.1–1.0)
Monocytes Relative: 11 %
Neutro Abs: 5.2 10*3/uL (ref 1.7–7.7)
Neutrophils Relative %: 69 %
Platelets: 298 10*3/uL (ref 150–400)
RBC: 3.85 MIL/uL — ABNORMAL LOW (ref 4.22–5.81)
RDW: 13.2 % (ref 11.5–15.5)
WBC: 7.6 10*3/uL (ref 4.0–10.5)
nRBC: 0 % (ref 0.0–0.2)

## 2023-08-24 LAB — COMPREHENSIVE METABOLIC PANEL
ALT: 12 U/L (ref 0–44)
AST: 13 U/L — ABNORMAL LOW (ref 15–41)
Albumin: 3.4 g/dL — ABNORMAL LOW (ref 3.5–5.0)
Alkaline Phosphatase: 51 U/L (ref 38–126)
Anion gap: 8 (ref 5–15)
BUN: 7 mg/dL — ABNORMAL LOW (ref 8–23)
CO2: 21 mmol/L — ABNORMAL LOW (ref 22–32)
Calcium: 8.9 mg/dL (ref 8.9–10.3)
Chloride: 103 mmol/L (ref 98–111)
Creatinine, Ser: 0.49 mg/dL — ABNORMAL LOW (ref 0.61–1.24)
GFR, Estimated: >60 mL/min (ref >60)
Glucose, Bld: 89 mg/dL (ref 70–99)
Potassium: 4 mmol/L (ref 3.5–5.1)
Sodium: 132 mmol/L — ABNORMAL LOW (ref 135–145)
Total Bilirubin: 0.5 mg/dL (ref 0.3–1.2)
Total Protein: 6.3 g/dL — ABNORMAL LOW (ref 6.5–8.1)

## 2023-08-24 LAB — OSMOLALITY: Osmolality: 279 mosm/kg (ref 275–295)

## 2023-08-24 LAB — TSH: TSH: 0.997 u[IU]/mL (ref 0.350–4.500)

## 2023-08-24 MED ORDER — SODIUM CHLORIDE 0.9 % IV SOLN: INTRAVENOUS | Status: DC

## 2023-08-24 NOTE — Care Management Obs Status (Signed)
MEDICARE OBSERVATION STATUS NOTIFICATION   Patient Details  Name: NEFI MUSICH MRN: [id number] Date of Birth: 16-Sep-1956   Medicare Observation Status Notification Given:  Yes    Corey Harold 08/24/2023, 4:04 PM

## 2023-08-24 NOTE — Plan of Care (Signed)
  Problem: Acute Rehab PT Goals(only PT should resolve) Goal: Pt Will Go Supine/Side To Sit Outcome: Progressing Flowsheets (Taken 08/24/2023 1245) Pt will go Supine/Side to Sit:  with modified independence  with supervision Goal: Patient Will Transfer Sit To/From Stand Outcome: Progressing Flowsheets (Taken 08/24/2023 1245) Patient will transfer sit to/from stand:  with modified independence  with supervision Goal: Pt Will Transfer Bed To Chair/Chair To Bed Outcome: Progressing Flowsheets (Taken 08/24/2023 1245) Pt will Transfer Bed to Chair/Chair to Bed:  with modified independence  with supervision Goal: Pt Will Ambulate Outcome: Progressing Flowsheets (Taken 08/24/2023 1245) Pt will Ambulate:  25 feet  with cane  with contact guard assist  with supervision   12:46 PM, 08/24/23 Ocie Bob, MPT Physical Therapist with [facility] [phone] office [phone] mobile phone

## 2023-08-24 NOTE — NC FL2 (Signed)
[place] MEDICAID FL2 LEVEL OF CARE FORM     IDENTIFICATION  Patient Name: Kenneth Lester Birthdate: [birth year] Sex: male Admission Date (Current Location): 08/23/2023  [facility] and [place] Number:  [employer] and Address:  [facility],  [address] [place] [address]      Provider Number: [id number]  Attending Physician Name and Address:  Erick Blinks, DO  Relative Name and Phone Number:       Current Level of Care: Hospital Recommended Level of Care: Skilled Nursing Facility Prior Approval Number:    Date Approved/Denied:   PASRR Number: [id number]  Discharge Plan: SNF    Current Diagnoses: Patient Active Problem List   Diagnosis Date Noted   Generalized weakness 07/17/2023   Acute hyponatremia 05/01/2023   Tobacco use disorder 05/01/2023   Hypokalemia 05/01/2023   Essential hypertension 05/01/2023   Dizziness 05/01/2023   History of alcohol abuse 05/01/2023   Hyponatremia 05/01/2023   Colon cancer screening 12/31/2018   Clavicle fracture 01/15/2014   Closed fracture of acromial end of clavicle 12/22/2013    Orientation RESPIRATION BLADDER Height & Weight     Self, Time, Situation, Place  Normal Continent Weight: 134 lb (60.8 kg) Height:  5\' 8"  (172.7 cm)  BEHAVIORAL SYMPTOMS/MOOD NEUROLOGICAL BOWEL NUTRITION STATUS      Continent Diet (see dc summary)  AMBULATORY STATUS COMMUNICATION OF NEEDS Skin   Extensive Assist Verbally Normal                       Personal Care Assistance Level of Assistance  Bathing, Feeding, Dressing Bathing Assistance: Limited assistance Feeding assistance: Independent Dressing Assistance: Limited assistance     Functional Limitations Info  Sight, Hearing, Speech Sight Info: Adequate Hearing Info: Adequate Speech Info: Adequate    SPECIAL CARE FACTORS FREQUENCY  PT (By licensed PT), OT (By licensed OT)     PT Frequency: 5x week OT Frequency: 5x week             Contractures Contractures Info: Not present    Additional Factors Info  Code Status, Allergies Code Status Info: Full             Current Medications (08/24/2023):  This is the current hospital active medication list Current Facility-Administered Medications  Medication Dose Route Frequency Provider Last Rate Last Admin   acetaminophen (TYLENOL) tablet 650 mg  650 mg Oral Q6H PRN Zierle-Ghosh, Asia B, DO       Or   acetaminophen (TYLENOL) suppository 650 mg  650 mg Rectal Q6H PRN Zierle-Ghosh, Asia B, DO       amLODipine (NORVASC) tablet 5 mg  5 mg Oral Daily Zierle-Ghosh, Asia B, DO   5 mg at 08/24/23 0805   heparin injection 5,000 Units  5,000 Units Subcutaneous Q8H Zierle-Ghosh, Asia B, DO   5,000 Units at 08/24/23 1305   lactulose (CHRONULAC) 10 GM/15ML solution 20 g  20 g Oral Daily PRN Zierle-Ghosh, Asia B, DO       losartan (COZAAR) tablet 25 mg  25 mg Oral Daily Zierle-Ghosh, Asia B, DO   25 mg at 08/24/23 0805   morphine (PF) 2 MG/ML injection 2 mg  2 mg Intravenous Q2H PRN Zierle-Ghosh, Asia B, DO       ondansetron (ZOFRAN) tablet 4 mg  4 mg Oral Q6H PRN Zierle-Ghosh, Asia B, DO       Or   ondansetron (ZOFRAN) injection 4 mg  4 mg Intravenous  Q6H PRN Zierle-Ghosh, Asia B, DO       oxyCODONE (Oxy IR/ROXICODONE) immediate release tablet 5 mg  5 mg Oral Q4H PRN Zierle-Ghosh, Asia B, DO       sodium chloride tablet 1 g  1 g Oral BID WC Zierle-Ghosh, Asia B, DO   1 g at 08/24/23 0805     Discharge Medications: Please see discharge summary for a list of discharge medications.  Relevant Imaging Results:  Relevant Lab Results:   Additional Information SSN: [id number] LCSW

## 2023-08-24 NOTE — TOC Initial Note (Signed)
Transition of Care [facility]) - Initial/Assessment Note    Patient Details  Name: Kenneth Lester MRN: [id number] Date of Birth: Dec 14, 1955  [facility]) CM/SW Contact:    Elliot Gault, LCSW Phone Number: 08/24/2023, 1:21 PM  Clinical Narrative:                  Pt admitted from home. PT recommending SNF rehab at dc.   Met with pt at bedside. He was just finishing his lunch. He voiced multiple complaints about everybody coming in to his room when he was doing something important.   Discussed home living situation with pt who stated that he lives with his wife but that she is employed outside the home and he is on his own all day. Pt states he is unable to walk or prepare his own food. He states he would like to go to SNF at dc.  CMS provider options reviewed. Will refer as requested and start insurance auth.  Attempted to update pt's wife with pt's consent but she did not answer her phone.  TOC will follow.  Expected Discharge Plan: Skilled Nursing Facility Barriers to Discharge: Continued Medical Work up   Patient Goals and CMS Choice Patient states their goals for this hospitalization and ongoing recovery are:: go to rehab CMS Medicare.gov Compare Post Acute Care list provided to:: Patient Choice offered to / list presented to : Patient [place] ownership interest in [facility].provided to:: Patient    Expected Discharge Plan and Services In-house Referral: Clinical Social Work   Post Acute Care Choice: Skilled Nursing Facility Living arrangements for the past 2 months: Single Family Home                                      Prior Living Arrangements/Services Living arrangements for the past 2 months: Single Family Home Lives with:: Spouse Patient language and need for interpreter reviewed:: Yes Do you feel safe going back to the place where you live?: Yes      Need for Family Participation in Patient Care: Yes (Comment) Care giver  support system in place?: No (comment) Current home services: DME Criminal Activity/Legal Involvement Pertinent to Current Situation/Hospitalization: No - Comment as needed  Activities of Daily Living   ADL Screening (condition at time of admission) Independently performs ADLs?: Yes (appropriate for developmental age) Is the patient deaf or have difficulty hearing?: No  Permission Sought/Granted Permission sought to share information with : Facility Industrial/product designer granted to share information with : Yes, Verbal Permission Granted     Permission granted to share info w AGENCY: snf        Emotional Assessment Appearance:: Appears stated age Attitude/Demeanor/Rapport: Reactive Affect (typically observed): Irritable Orientation: : Oriented to Self, Oriented to Place, Oriented to  Time, Oriented to Situation Alcohol / Substance Use: Not Applicable Psych Involvement: No (comment)  Admission diagnosis:  Acute hyponatremia [E87.1] Hyponatremia [E87.1] Patient Active Problem List   Diagnosis Date Noted   Generalized weakness 07/17/2023   Acute hyponatremia 05/01/2023   Tobacco use disorder 05/01/2023   Hypokalemia 05/01/2023   Essential hypertension 05/01/2023   Dizziness 05/01/2023   History of alcohol abuse 05/01/2023   Hyponatremia 05/01/2023   Colon cancer screening 12/31/2018   Clavicle fracture 01/15/2014   Closed fracture of acromial end of clavicle 12/22/2013   PCP:  Carylon Perches, MD Pharmacy:   [id number] - [place] - [address] [place] [address] Phone: [phone] Fax: [phone]     Social Determinants of Health (SDOH) Social History: SDOH Screenings   Food Insecurity: Food Insecurity Present (08/23/2023)  Housing: Low Risk  (08/23/2023)  Transportation Needs: Unmet Transportation Needs (08/23/2023)  Utilities: Not At Risk (08/23/2023)  Tobacco  Use: High Risk (08/23/2023)   SDOH Interventions:     Readmission Risk Interventions    07/18/2023    2:27 PM  Readmission Risk Prevention Plan  Transportation Screening Complete  PCP or Specialist Appt within 3-5 Days Complete  HRI or Home Care Consult Complete  Social Work Consult for Recovery Care Planning/Counseling Complete  Palliative Care Screening Not Applicable  Medication Review Oceanographer) Complete

## 2023-08-24 NOTE — Progress Notes (Signed)
PROGRESS NOTE    Kenneth Lester  NWG:[id number] DOB: 1955-12-14 DOA: 08/23/2023 PCP: Carylon Perches, MD   Brief Narrative:    Kenneth Lester is a 67 y.o. male with medical history significant of history of alcohol abuse, declines alcohol use at this time, hypertension, tobacco use disorder, and more presents to the ED with a chief complaint of dizziness.  He was admitted with generalized weakness in the setting of acute hyponatremia that is slowly improving.  PT recommending SNF.  Assessment & Plan:   Principal Problem:   Acute hyponatremia Active Problems:   Tobacco use disorder   Essential hypertension   Dizziness   Generalized weakness  Assessment and Plan:   Acute hyponatremia - Sodium 1 down to 127 - 10 days ago sodium was 134 - Discontinue further IV fluid and maintain on salt tablets for now - Patient has dizziness and generalized weakness associated - May need to be discharged on sodium tabs, monitor a.m. labs   Generalized weakness - Likely related to acute hyponatremia - PT eval and treat with recommendation for SNF   Essential hypertension - Continue Cozaar and Norvasc   Tobacco use disorder - Smokes 1 pack/day - Declines nicotine patch at this time   DVT prophylaxis:Heparin Code Status: Full Family Communication:  Disposition Plan:  Status is: Observation The patient will require care spanning > 2 midnights and should be moved to inpatient because: Need for placement.   Consultants:  None  Procedures:  None  Antimicrobials:  None   Subjective: Patient seen and evaluated today and is overall in a foul mood and states that he needs to sleep.  He does not give much history as a result and overall is quite irritated.  He seems to state that he is overall weak and has trouble fixing food for himself at home and therefore does not eat very much.  Objective: Vitals:   08/23/23 2248 08/24/23 0318 08/24/23 0805 08/24/23 1306  BP: 111/62 136/74 (!)  150/78 124/70  Pulse: 67 68  64  Resp: 16 18  17   Temp: 98.2 F (36.8 C) 98.8 F (37.1 C)  98.4 F (36.9 C)  TempSrc: Oral Oral  Oral  SpO2: 99% 99%  100%  Weight:      Height:        Intake/Output Summary (Last 24 hours) at 08/24/2023 1354 Last data filed at 08/24/2023 1002 Gross per 24 hour  Intake 576.42 ml  Output --  Net 576.42 ml   Filed Weights   08/23/23 1131  Weight: 60.8 kg    Examination:  General exam: Appears calm and comfortable  Respiratory system: Clear to auscultation. Respiratory effort normal. Cardiovascular system: S1 & S2 heard, RRR.  Gastrointestinal system: Abdomen is soft Central nervous system: Alert and awake Extremities: No edema Skin: No significant lesions noted Psychiatry: Flat affect.    Data Reviewed: I have personally reviewed following labs and imaging studies  CBC: Recent Labs  Lab 08/23/23 1322 08/24/23 0358  WBC 11.4* 7.6  NEUTROABS 8.3* 5.2  HGB 13.1 11.9*  HCT 38.5* 34.4*  MCV 91.0 89.4  PLT 330 298   Basic Metabolic Panel: Recent Labs  Lab 08/23/23 1322 08/23/23 1833 08/24/23 0358  NA 127* 130* 132*  K 3.9 4.3 4.0  CL 95* 101 103  CO2 24 22 21*  GLUCOSE 100* 97 89  BUN 6* 5* 7*  CREATININE 0.50* 0.43* 0.49*  CALCIUM 9.4 9.0 8.9  MG  --  2.2  --  GFR: Estimated Creatinine Clearance: 78.1 mL/min (A) (by C-G formula based on SCr of 0.49 mg/dL (L)). Liver Function Tests: Recent Labs  Lab 08/24/23 0358  AST 13*  ALT 12  ALKPHOS 51  BILITOT 0.5  PROT 6.3*  ALBUMIN 3.4*   No results for input(s): "LIPASE", "AMYLASE" in the last 168 hours. No results for input(s): "AMMONIA" in the last 168 hours. Coagulation Profile: No results for input(s): "INR", "PROTIME" in the last 168 hours. Cardiac Enzymes: No results for input(s): "CKTOTAL", "CKMB", "CKMBINDEX", "TROPONINI" in the last 168 hours. BNP (last 3 results) No results for input(s): "PROBNP" in the last 8760 hours. HbA1C: No results for input(s):  "HGBA1C" in the last 72 hours. CBG: No results for input(s): "GLUCAP" in the last 168 hours. Lipid Profile: No results for input(s): "CHOL", "HDL", "LDLCALC", "TRIG", "CHOLHDL", "LDLDIRECT" in the last 72 hours. Thyroid Function Tests: Recent Labs    08/24/23 0358  TSH 0.997   Anemia Panel: No results for input(s): "VITAMINB12", "FOLATE", "FERRITIN", "TIBC", "IRON", "RETICCTPCT" in the last 72 hours. Sepsis Labs: No results for input(s): "PROCALCITON", "LATICACIDVEN" in the last 168 hours.  No results found for this or any previous visit (from the past 240 hour(s)).       Radiology Studies: CT Head Wo Contrast  Result Date: 08/23/2023 CLINICAL DATA:  Vertigo, central EXAM: CT HEAD WITHOUT CONTRAST TECHNIQUE: Contiguous axial images were obtained from the base of the skull through the vertex without intravenous contrast. RADIATION DOSE REDUCTION: This exam was performed according to the departmental dose-optimization program which includes automated exposure control, adjustment of the mA and/or kV according to patient size and/or use of iterative reconstruction technique. COMPARISON:  CT head October 21, 24. FINDINGS: Brain: No evidence of acute infarction, hemorrhage, hydrocephalus, extra-axial collection or mass lesion/mass effect. Patchy white matter and deep gray hypodensities are nonspecific but compatible with chronic microvascular ischemic disease. Vascular: No hyperdense vessel. Skull: No acute fracture. Sinuses/Orbits: Mostly clear sinuses.  No acute orbital findings. Other: No mastoid effusions. IMPRESSION: 1. No evidence of acute intracranial abnormality. 2. Similar advanced chronic microvascular ischemic disease, further characterized on prior MRI. Electronically Signed   By: Feliberto Harts M.D.   On: 08/23/2023 16:31        Scheduled Meds:  amLODipine  5 mg Oral Daily   heparin  5,000 Units Subcutaneous Q8H   losartan  25 mg Oral Daily   sodium chloride  1 g Oral  BID WC     LOS: 0 days    Time spent: 35 minutes    Adaliah Hiegel Hoover Brunette, DO [facility]  If 7PM-7AM, please contact night-coverage [email or web address] 08/24/2023, 1:54 PM

## 2023-08-24 NOTE — Evaluation (Signed)
Physical Therapy Evaluation Patient Details Name: Kenneth Lester MRN: [id number] DOB: [birth year] Today's Date: 08/24/2023  History of Present Illness  Kenneth Lester is a 67 y.o. male with medical history significant of history of alcohol abuse, declines alcohol use at this time, hypertension, tobacco use disorder, and more presents to the ED with a chief complaint of dizziness.  Patient reports that he feels unsteady.  It is especially when he goes from sitting to standing.  Patient describes it as feeling unsteady on his feet like he is going to fall down.  He is not actually had a fall because he stayed in the house and close to the walls.  Patient reports he has no trouble with dizziness when he sitting or laying.  Reports the dizziness hits him about 10-15 seconds after he is stood up.  He denies any chest pain, palpitations, dyspnea, headache.  Patient reports that he has had decreased p.o. intake because of limited access to food.  He reports that he feels like he is going to fall he cannot be "hunting food."  Patient denies any nausea or vomiting.  He has no other complaints at this time.   Clinical Impression  Patient demonstrates slow labored movement for sitting up at bedside, very unsteady on feet having to lean on walls, IV pole when taking steps using RW and limited for ambulation mostly due to c/o fatigue and fall risk.  Patient states he can't use RW in home due to not enough room to maneuver.  Patient requested to go back to bed after therapy due to c/o fatigue.  Patient will benefit from continued skilled physical therapy in hospital and recommended venue below to increase strength, balance, endurance for safe ADLs and gait.          If plan is discharge home, recommend the following: A little help with walking and/or transfers;A lot of help with walking and/or transfers;A little help with bathing/dressing/bathroom;Help with stairs or ramp for entrance;Assistance with  cooking/housework   Can travel by private vehicle   Yes    Equipment Recommendations None recommended by PT  Recommendations for Other Services       Functional Status Assessment Patient has had a recent decline in their functional status and demonstrates the ability to make significant improvements in function in a reasonable and predictable amount of time.     Precautions / Restrictions Precautions Precautions: Fall Restrictions Weight Bearing Restrictions: No      Mobility  Bed Mobility Overal bed mobility: Needs Assistance Bed Mobility: Supine to Sit, Sit to Supine     Supine to sit: Supervision, HOB elevated Sit to supine: Supervision, HOB elevated   General bed mobility comments: slow labored movement requiring HOB raised    Transfers Overall transfer level: Needs assistance Equipment used: Straight cane Transfers: Sit to/from Stand, Bed to chair/wheelchair/BSC Sit to Stand: Min assist   Step pivot transfers: Min assist       General transfer comment: slow labored movement with frequent rest breaks    Ambulation/Gait Ambulation/Gait assistance: Min assist Gait Distance (Feet): 20 Feet Assistive device: Straight cane, IV Pole Gait Pattern/deviations: Decreased step length - right, Decreased step length - left, Decreased stride length Gait velocity: slow     General Gait Details: limited to walking in room having to hold onto IV pole, lean on walls while using cane with slow labored unsteady movement  Careers information officer  Tilt Bed    Modified Rankin (Stroke Patients Only)       Balance Overall balance assessment: Needs assistance Sitting-balance support: Feet supported, No upper extremity supported Sitting balance-Leahy Scale: Fair Sitting balance - Comments: fair/good seate at EOB   Standing balance support: During functional activity, Bilateral upper extremity supported Standing balance-Leahy Scale:  Poor Standing balance comment: fair/poor using RW                             Pertinent Vitals/Pain Pain Assessment Pain Assessment: Faces Faces Pain Scale: Hurts little more Pain Location: right foot/ankle Pain Descriptors / Indicators: Sore, Grimacing, Guarding Pain Intervention(s): Limited activity within patient's tolerance, Monitored during session, Repositioned    Home Living Family/patient expects to be discharged to:: Private residence Living Arrangements: Spouse/significant other Available Help at Discharge: Family;Available PRN/intermittently Type of Home: House Home Access: Stairs to enter Entrance Stairs-Rails: Doctor, general practice of Steps: 3-4   Home Layout: One level Home Equipment: Cane - single point;Shower seat;Grab bars - tub/shower;Rolling Walker (2 wheels)      Prior Function Prior Level of Function : Needs assist       Physical Assist : ADLs (physical);Mobility (physical) Mobility (physical): Bed mobility;Transfers;Gait;Stairs   Mobility Comments: household and short distanced community ambulator using [facility] ADLs Comments: Independent ADL assist IADL by wife     Extremity/Trunk Assessment   Upper Extremity Assessment Upper Extremity Assessment: Generalized weakness    Lower Extremity Assessment Lower Extremity Assessment: Generalized weakness    Cervical / Trunk Assessment Cervical / Trunk Assessment: Normal  Communication   Communication Communication: No apparent difficulties  Cognition Arousal: Alert Behavior During Therapy: WFL for tasks assessed/performed Overall Cognitive Status: Within Functional Limits for tasks assessed                                          General Comments      Exercises     Assessment/Plan    PT Assessment Patient needs continued PT services  PT Problem List Decreased strength;Decreased activity tolerance;Decreased balance;Decreased mobility       PT  Treatment Interventions DME instruction;Gait training;Stair training;Functional mobility training;Therapeutic activities;Therapeutic exercise;Balance training;Patient/family education    PT Goals (Current goals can be found in the Care Plan section)  Acute Rehab PT Goals Patient Stated Goal: return home able to walk safely without assistance PT Goal Formulation: With patient Time For Goal Achievement: 10/05/23 Potential to Achieve Goals: Good    Frequency Min 3X/week     Co-evaluation               AM-PAC PT "6 Clicks" Mobility  Outcome Measure Help needed turning from your back to your side while in a flat bed without using bedrails?: A Little Help needed moving from lying on your back to sitting on the side of a flat bed without using bedrails?: A Little Help needed moving to and from a bed to a chair (including a wheelchair)?: A Little Help needed standing up from a chair using your arms (e.g., wheelchair or bedside chair)?: A Little Help needed to walk in hospital room?: A Lot Help needed climbing 3-5 steps with a railing? : A Lot 6 Click Score: 16    End of Session   Activity Tolerance: Patient tolerated treatment well;Patient limited by fatigue Patient left: in bed;with call bell/phone within  reach Nurse Communication: Mobility status PT Visit Diagnosis: Unsteadiness on feet (R26.81);Other abnormalities of gait and mobility (R26.89);Muscle weakness (generalized) (M62.81)    Time: 0930-1000 PT Time Calculation (min) (ACUTE ONLY): 30 min   Charges:   PT Evaluation $PT Eval Moderate Complexity: 1 Mod PT Treatments $Therapeutic Activity: 23-37 mins PT General Charges $$ ACUTE PT VISIT: 1 Visit         12:44 PM, 08/24/23 Ocie Bob, MPT Physical Therapist with [facility] [phone] office [phone] mobile phone

## 2023-08-24 NOTE — Progress Notes (Signed)
Pt refused orthostatic vitals this a.m. Pt stated "Yall just aren't gonna let me get any sleep are you?"

## 2023-08-25 DIAGNOSIS — E871 Hypo-osmolality and hyponatremia: Secondary | ICD-10-CM | POA: Diagnosis not present

## 2023-08-25 LAB — BASIC METABOLIC PANEL
Anion gap: 9 (ref 5–15)
BUN: 6 mg/dL — ABNORMAL LOW (ref 8–23)
CO2: 25 mmol/L (ref 22–32)
Calcium: 9.5 mg/dL (ref 8.9–10.3)
Chloride: 102 mmol/L (ref 98–111)
Creatinine, Ser: 0.62 mg/dL (ref 0.61–1.24)
GFR, Estimated: >60 mL/min (ref >60)
Glucose, Bld: 128 mg/dL — ABNORMAL HIGH (ref 70–99)
Potassium: 4.9 mmol/L (ref 3.5–5.1)
Sodium: 136 mmol/L (ref 135–145)

## 2023-08-25 LAB — MAGNESIUM: Magnesium: 2 mg/dL (ref 1.7–2.4)

## 2023-08-25 LAB — CBC
HCT: 37.2 % — ABNORMAL LOW (ref 39.0–52.0)
Hemoglobin: 12.3 g/dL — ABNORMAL LOW (ref 13.0–17.0)
MCH: 30.3 pg (ref 26.0–34.0)
MCHC: 33.1 g/dL (ref 30.0–36.0)
MCV: 91.6 fL (ref 80.0–100.0)
Platelets: 278 10*3/uL (ref 150–400)
RBC: 4.06 MIL/uL — ABNORMAL LOW (ref 4.22–5.81)
RDW: 13.3 % (ref 11.5–15.5)
WBC: 6.2 10*3/uL (ref 4.0–10.5)
nRBC: 0 % (ref 0.0–0.2)

## 2023-08-25 NOTE — Plan of Care (Signed)
  Problem: Education: Goal: Knowledge of General Education information will improve Description: Including pain rating scale, medication(s)/side effects and non-pharmacologic comfort measures Outcome: Not Progressing   Problem: Health Behavior/Discharge Planning: Goal: Ability to manage health-related needs will improve Outcome: Not Progressing   

## 2023-08-25 NOTE — TOC Progression Note (Signed)
Transition of Care [facility]) - Progression Note    Patient Details  Name: Kenneth Lester MRN: [id number] Date of Birth: 1956/03/22  [facility]) CM/SW Contact  Princella Ion, LCSW Phone Number: 08/25/2023, 8:56 AM  Clinical Narrative:    Pt's Berkley Harvey is still pending at this time.    Expected Discharge Plan: Skilled Nursing Facility Barriers to Discharge: Continued Medical Work up  Expected Discharge Plan and Services In-house Referral: Clinical Social Work   Post Acute Care Choice: Skilled Nursing Facility Living arrangements for the past 2 months: Single Family Home                                       Social Determinants of Health (SDOH) Interventions SDOH Screenings   Food Insecurity: Food Insecurity Present (08/23/2023)  Housing: Low Risk  (08/23/2023)  Transportation Needs: Unmet Transportation Needs (08/23/2023)  Utilities: Not At Risk (08/23/2023)  Tobacco Use: High Risk (08/23/2023)    Readmission Risk Interventions    07/18/2023    2:27 PM  Readmission Risk Prevention Plan  Transportation Screening Complete  PCP or Specialist Appt within 3-5 Days Complete  HRI or Home Care Consult Complete  Social Work Consult for Recovery Care Planning/Counseling Complete  Palliative Care Screening Not Applicable  Medication Review Oceanographer) Complete

## 2023-08-25 NOTE — Progress Notes (Signed)
Upon entering patient room patient immediately very irritable, non-compliant. Patient refused for me to do my shift assessment. Patient stated, "No one has done his skin or listened to him since he has been here." Patient also stated, "He just wants to get some damn sleep, that we are making his health worse by constantly coming in his room." Educated patient on why it is important we come in his room. Patient starting to get  argumentative.

## 2023-08-25 NOTE — Progress Notes (Signed)
PROGRESS NOTE    Kenneth Lester  BJY:[id number] DOB: [birth year] DOA: 08/23/2023 PCP: Carylon Perches, MD   Brief Narrative:    COLESON Lester is a 67 y.o. male with medical history significant of history of alcohol abuse, declines alcohol use at this time, hypertension, tobacco use disorder, and more presents to the ED with a chief complaint of dizziness.  He was admitted with generalized weakness in the setting of acute hyponatremia that is slowly improving.  PT recommending SNF and patient currently awaiting placement.  Assessment & Plan:   Principal Problem:   Acute hyponatremia Active Problems:   Tobacco use disorder   Essential hypertension   Dizziness   Generalized weakness  Assessment and Plan:   Acute hyponatremia-resolved - Discontinue further IV fluid and maintain and salt tablets for now - Patient has dizziness and generalized weakness associated - Monitor labs every other day   Generalized weakness - Likely related to acute hyponatremia - PT eval and treat with recommendation for SNF   Essential hypertension - Continue Cozaar and Norvasc   Tobacco use disorder - Smokes 1 pack/day - Declines nicotine patch at this time   DVT prophylaxis:Heparin Code Status: Full Family Communication: None at bedside Disposition Plan:  Status is: Observation The patient will require care spanning > 2 midnights and should be moved to inpatient because: Need for placement    Consultants:  None  Procedures:  None  Antimicrobials:  None   Subjective: Patient seen and evaluated today and continues to overall remain in of home mood, but appears to be feeling better.  No significant complaints or concerns noted.  Objective: Vitals:   08/24/23 1306 08/24/23 2036 08/25/23 0512 08/25/23 0829  BP: 124/70 (!) 151/70 (!) 148/84 139/80  Pulse: 64 65 66 65  Resp: 17 16 17 17   Temp: 98.4 F (36.9 C) 98.4 F (36.9 C) 98.2 F (36.8 C)   TempSrc: Oral Oral Oral   SpO2:  100% 99% 98% 100%  Weight:      Height:        Intake/Output Summary (Last 24 hours) at 08/25/2023 1157 Last data filed at 08/25/2023 0917 Gross per 24 hour  Intake 960 ml  Output --  Net 960 ml   Filed Weights   08/23/23 1131  Weight: 60.8 kg    Examination:  General exam: Appears calm and comfortable  Respiratory system: Clear to auscultation. Respiratory effort normal. Cardiovascular system: S1 & S2 heard, RRR.  Gastrointestinal system: Abdomen is soft Central nervous system: Alert and awake Extremities: No edema Skin: No significant lesions noted Psychiatry: Flat affect.    Data Reviewed: I have personally reviewed following labs and imaging studies  CBC: Recent Labs  Lab 08/23/23 1322 08/24/23 0358 08/25/23 0948  WBC 11.4* 7.6 6.2  NEUTROABS 8.3* 5.2  --   HGB 13.1 11.9* 12.3*  HCT 38.5* 34.4* 37.2*  MCV 91.0 89.4 91.6  PLT 330 298 278   Basic Metabolic Panel: Recent Labs  Lab 08/23/23 1322 08/23/23 1833 08/24/23 0358 08/25/23 0948  NA 127* 130* 132* 136  K 3.9 4.3 4.0 4.9  CL 95* 101 103 102  CO2 24 22 21* 25  GLUCOSE 100* 97 89 128*  BUN 6* 5* 7* 6*  CREATININE 0.50* 0.43* 0.49* 0.62  CALCIUM 9.4 9.0 8.9 9.5  MG  --  2.2  --  2.0   GFR: Estimated Creatinine Clearance: 78.1 mL/min (by C-G formula based on SCr of 0.62 mg/dL). Liver Function Tests: Recent  Labs  Lab 08/24/23 0358  AST 13*  ALT 12  ALKPHOS 51  BILITOT 0.5  PROT 6.3*  ALBUMIN 3.4*   No results for input(s): "LIPASE", "AMYLASE" in the last 168 hours. No results for input(s): "AMMONIA" in the last 168 hours. Coagulation Profile: No results for input(s): "INR", "PROTIME" in the last 168 hours. Cardiac Enzymes: No results for input(s): "CKTOTAL", "CKMB", "CKMBINDEX", "TROPONINI" in the last 168 hours. BNP (last 3 results) No results for input(s): "PROBNP" in the last 8760 hours. HbA1C: No results for input(s): "HGBA1C" in the last 72 hours. CBG: No results for input(s):  "GLUCAP" in the last 168 hours. Lipid Profile: No results for input(s): "CHOL", "HDL", "LDLCALC", "TRIG", "CHOLHDL", "LDLDIRECT" in the last 72 hours. Thyroid Function Tests: Recent Labs    08/24/23 0358  TSH 0.997   Anemia Panel: No results for input(s): "VITAMINB12", "FOLATE", "FERRITIN", "TIBC", "IRON", "RETICCTPCT" in the last 72 hours. Sepsis Labs: No results for input(s): "PROCALCITON", "LATICACIDVEN" in the last 168 hours.  No results found for this or any previous visit (from the past 240 hour(s)).       Radiology Studies: CT Head Wo Contrast  Result Date: 08/23/2023 CLINICAL DATA:  Vertigo, central EXAM: CT HEAD WITHOUT CONTRAST TECHNIQUE: Contiguous axial images were obtained from the base of the skull through the vertex without intravenous contrast. RADIATION DOSE REDUCTION: This exam was performed according to the departmental dose-optimization program which includes automated exposure control, adjustment of the mA and/or kV according to patient size and/or use of iterative reconstruction technique. COMPARISON:  CT head October 21, 24. FINDINGS: Brain: No evidence of acute infarction, hemorrhage, hydrocephalus, extra-axial collection or mass lesion/mass effect. Patchy white matter and deep gray hypodensities are nonspecific but compatible with chronic microvascular ischemic disease. Vascular: No hyperdense vessel. Skull: No acute fracture. Sinuses/Orbits: Mostly clear sinuses.  No acute orbital findings. Other: No mastoid effusions. IMPRESSION: 1. No evidence of acute intracranial abnormality. 2. Similar advanced chronic microvascular ischemic disease, further characterized on prior MRI. Electronically Signed   By: Feliberto Harts M.D.   On: 08/23/2023 16:31        Scheduled Meds:  amLODipine  5 mg Oral Daily   heparin  5,000 Units Subcutaneous Q8H   losartan  25 mg Oral Daily   sodium chloride  1 g Oral BID WC     LOS: 0 days    Time spent: 35  minutes    Priscilla Kirstein Hoover Brunette, DO [facility]  If 7PM-7AM, please contact night-coverage [email or web address] 08/25/2023, 11:57 AM

## 2023-08-26 DIAGNOSIS — E871 Hypo-osmolality and hyponatremia: Secondary | ICD-10-CM | POA: Diagnosis not present

## 2023-08-26 NOTE — TOC Progression Note (Signed)
Transition of Care [facility]) - Progression Note    Patient Details  Name: Kenneth Lester MRN: [id number] Date of Birth: [birth year]  [facility] CM/SW Contact  Larrie Kass, LCSW Phone Number: 08/26/2023, 1:49 PM  Clinical Narrative:     Pt's insurance auth for SNF placement is pending. TOC to follow.  Expected Discharge Plan: Skilled Nursing Facility Barriers to Discharge: Continued Medical Work up  Expected Discharge Plan and Services In-house Referral: Clinical Social Work   Post Acute Care Choice: Skilled Nursing Facility Living arrangements for the past 2 months: Single Family Home                                       Social Determinants of Health (SDOH) Interventions SDOH Screenings   Food Insecurity: Food Insecurity Present (08/23/2023)  Housing: Low Risk  (08/23/2023)  Transportation Needs: Unmet Transportation Needs (08/23/2023)  Utilities: Not At Risk (08/23/2023)  Tobacco Use: High Risk (08/23/2023)    Readmission Risk Interventions    07/18/2023    2:27 PM  Readmission Risk Prevention Plan  Transportation Screening Complete  PCP or Specialist Appt within 3-5 Days Complete  HRI or Home Care Consult Complete  Social Work Consult for Recovery Care Planning/Counseling Complete  Palliative Care Screening Not Applicable  Medication Review Oceanographer) Complete

## 2023-08-26 NOTE — Progress Notes (Signed)
PROGRESS NOTE    Kenneth Lester  ZHY:[id number] DOB: 02/26/1956 DOA: 08/23/2023 PCP: Carylon Perches, MD   Brief Narrative:    Kenneth Lester is a 67 y.o. male with medical history significant of history of alcohol abuse, declines alcohol use at this time, hypertension, tobacco use disorder, and more presents to the ED with a chief complaint of dizziness.  He was admitted with generalized weakness in the setting of acute hyponatremia that is slowly improving.  PT recommending SNF and patient currently awaiting placement.  Assessment & Plan:   Principal Problem:   Acute hyponatremia Active Problems:   Tobacco use disorder   Essential hypertension   Dizziness   Generalized weakness  Assessment and Plan:   Acute hyponatremia-resolved - Discontinue further IV fluid and maintain and salt tablets for now - Patient has dizziness and generalized weakness associated - Monitor labs every other day   Generalized weakness - Likely related to acute hyponatremia - PT eval and treat with recommendation for SNF   Essential hypertension - Continue Cozaar and Norvasc   Tobacco use disorder - Smokes 1 pack/day - Declines nicotine patch at this time   DVT prophylaxis:Heparin Code Status: Full Family Communication: None at bedside Disposition Plan:  Status is: Observation The patient will require care spanning > 2 midnights and should be moved to inpatient because: placement needs.    Consultants:  None  Procedures:  None  Antimicrobials:  None   Subjective: Patient seen and evaluated today and appears to be feeling better.  No significant complaints or concerns noted.  Objective: Vitals:   08/25/23 1450 08/25/23 2018 08/26/23 0600 08/26/23 0917  BP: 136/73 (!) 149/82 132/77 (!) 167/81  Pulse: 65 61 61 75  Resp: 18  16 16   Temp: 98.5 F (36.9 C) 98.3 F (36.8 C) 97.7 F (36.5 C)   TempSrc: Oral Oral Oral   SpO2: 100% 100% 100% 100%  Weight:      Height:         Intake/Output Summary (Last 24 hours) at 08/26/2023 1036 Last data filed at 08/25/2023 1745 Gross per 24 hour  Intake 480 ml  Output --  Net 480 ml   Filed Weights   08/23/23 1131  Weight: 60.8 kg    Examination:  General exam: Appears calm and comfortable  Respiratory system: Clear to auscultation. Respiratory effort normal. Cardiovascular system: S1 & S2 heard, RRR.  Gastrointestinal system: Abdomen is soft Central nervous system: Alert and awake Extremities: No edema Skin: No significant lesions noted Psychiatry: Flat affect.    Data Reviewed: I have personally reviewed following labs and imaging studies  CBC: Recent Labs  Lab 08/23/23 1322 08/24/23 0358 08/25/23 0948  WBC 11.4* 7.6 6.2  NEUTROABS 8.3* 5.2  --   HGB 13.1 11.9* 12.3*  HCT 38.5* 34.4* 37.2*  MCV 91.0 89.4 91.6  PLT 330 298 278   Basic Metabolic Panel: Recent Labs  Lab 08/23/23 1322 08/23/23 1833 08/24/23 0358 08/25/23 0948  NA 127* 130* 132* 136  K 3.9 4.3 4.0 4.9  CL 95* 101 103 102  CO2 24 22 21* 25  GLUCOSE 100* 97 89 128*  BUN 6* 5* 7* 6*  CREATININE 0.50* 0.43* 0.49* 0.62  CALCIUM 9.4 9.0 8.9 9.5  MG  --  2.2  --  2.0   GFR: Estimated Creatinine Clearance: 78.1 mL/min (by C-G formula based on SCr of 0.62 mg/dL). Liver Function Tests: Recent Labs  Lab 08/24/23 0358  AST 13*  ALT  12  ALKPHOS 51  BILITOT 0.5  PROT 6.3*  ALBUMIN 3.4*   No results for input(s): "LIPASE", "AMYLASE" in the last 168 hours. No results for input(s): "AMMONIA" in the last 168 hours. Coagulation Profile: No results for input(s): "INR", "PROTIME" in the last 168 hours. Cardiac Enzymes: No results for input(s): "CKTOTAL", "CKMB", "CKMBINDEX", "TROPONINI" in the last 168 hours. BNP (last 3 results) No results for input(s): "PROBNP" in the last 8760 hours. HbA1C: No results for input(s): "HGBA1C" in the last 72 hours. CBG: No results for input(s): "GLUCAP" in the last 168 hours. Lipid  Profile: No results for input(s): "CHOL", "HDL", "LDLCALC", "TRIG", "CHOLHDL", "LDLDIRECT" in the last 72 hours. Thyroid Function Tests: Recent Labs    08/24/23 0358  TSH 0.997   Anemia Panel: No results for input(s): "VITAMINB12", "FOLATE", "FERRITIN", "TIBC", "IRON", "RETICCTPCT" in the last 72 hours. Sepsis Labs: No results for input(s): "PROCALCITON", "LATICACIDVEN" in the last 168 hours.  No results found for this or any previous visit (from the past 240 hour(s)).       Radiology Studies: No results found.      Scheduled Meds:  amLODipine  5 mg Oral Daily   heparin  5,000 Units Subcutaneous Q8H   losartan  25 mg Oral Daily     LOS: 0 days    Time spent: 35 minutes    Mihaela Fajardo Hoover Brunette, DO [facility]  If 7PM-7AM, please contact night-coverage [email or web address] 08/26/2023, 10:36 AM

## 2023-08-26 NOTE — Progress Notes (Signed)
Upon this RN's assessment patient is alert and oriented x 4. He voices frustration that wants to sleep and is constantly getting woken up. He reports that he wants to be left alone. This RN educated him on purpose of rounding and participating in his care. No evidence of learning or acceptance identified after education. [place] RN

## 2023-08-26 NOTE — Plan of Care (Signed)
  Problem: Clinical Measurements: Goal: Will remain free from infection Outcome: Progressing Goal: Diagnostic test results will improve Outcome: Progressing   

## 2023-08-27 DIAGNOSIS — M6281 Muscle weakness (generalized): Secondary | ICD-10-CM | POA: Diagnosis not present

## 2023-08-27 DIAGNOSIS — R531 Weakness: Secondary | ICD-10-CM | POA: Diagnosis not present

## 2023-08-27 DIAGNOSIS — E876 Hypokalemia: Secondary | ICD-10-CM | POA: Diagnosis not present

## 2023-08-27 DIAGNOSIS — F4323 Adjustment disorder with mixed anxiety and depressed mood: Secondary | ICD-10-CM | POA: Diagnosis not present

## 2023-08-27 DIAGNOSIS — R279 Unspecified lack of coordination: Secondary | ICD-10-CM | POA: Diagnosis not present

## 2023-08-27 DIAGNOSIS — E538 Deficiency of other specified B group vitamins: Secondary | ICD-10-CM | POA: Diagnosis not present

## 2023-08-27 DIAGNOSIS — E871 Hypo-osmolality and hyponatremia: Secondary | ICD-10-CM | POA: Diagnosis not present

## 2023-08-27 DIAGNOSIS — I10 Essential (primary) hypertension: Secondary | ICD-10-CM | POA: Diagnosis not present

## 2023-08-27 DIAGNOSIS — Z79899 Other long term (current) drug therapy: Secondary | ICD-10-CM | POA: Diagnosis not present

## 2023-08-27 DIAGNOSIS — F17210 Nicotine dependence, cigarettes, uncomplicated: Secondary | ICD-10-CM | POA: Diagnosis not present

## 2023-08-27 LAB — BASIC METABOLIC PANEL
Anion gap: 6 (ref 5–15)
BUN: 8 mg/dL (ref 8–23)
CO2: 23 mmol/L (ref 22–32)
Calcium: 8.9 mg/dL (ref 8.9–10.3)
Chloride: 105 mmol/L (ref 98–111)
Creatinine, Ser: 0.57 mg/dL — ABNORMAL LOW (ref 0.61–1.24)
GFR, Estimated: >60 mL/min (ref >60)
Glucose, Bld: 88 mg/dL (ref 70–99)
Potassium: 3.6 mmol/L (ref 3.5–5.1)
Sodium: 134 mmol/L — ABNORMAL LOW (ref 135–145)

## 2023-08-27 MED ORDER — SODIUM CHLORIDE 1 G PO TABS: 1.0000 g | ORAL_TABLET | Freq: Two times a day (BID) | ORAL | 0 refills | Status: AC

## 2023-08-27 MED ORDER — SODIUM CHLORIDE 1 G PO TABS
1.0000 g | ORAL_TABLET | Freq: Two times a day (BID) | ORAL | Status: DC
  Administered 2023-08-27: 1 g via ORAL
  Filled 2023-08-27: qty 1

## 2023-08-27 NOTE — TOC Transition Note (Signed)
Transition of Care [facility]) - CM/SW Discharge Note   Patient Details  Name: Kenneth Lester MRN: [id number] Date of Birth: 25-Jun-1956  Transition of Care [facility]) CM/SW Contact:  Elliot Gault, LCSW Phone Number: 08/27/2023, 10:36 AM   Clinical Narrative:     TOC following. Pt has auth for SNF. MD states pt is medically stable for dc today. Updated Debbie at [facility] who states that they can admit pt today.   Updated pt/wife and they remain in agreement with dc plan.   DC clinical sent electronically. RN to call report. Transport arranged with [facility].  No other TOC needs for dc.  Final next level of care: Skilled Nursing Facility Barriers to Discharge: Barriers Resolved   Patient Goals and CMS Choice CMS Medicare.gov Compare Post Acute Care list provided to:: Patient Choice offered to / list presented to : Patient  Discharge Placement                Patient chooses bed at: Other - please specify in the comment section below: [facility]) Patient to be transferred to facility by: [facility] Name of family member notified: [name] Patient and family notified of of transfer: 08/27/23  Discharge Plan and Services Additional resources added to the After Visit Summary for   In-house Referral: Clinical Social Work   Post Acute Care Choice: Skilled Nursing Facility                               Social Determinants of Health (SDOH) Interventions SDOH Screenings   Food Insecurity: Food Insecurity Present (08/23/2023)  Housing: Low Risk  (08/23/2023)  Transportation Needs: Unmet Transportation Needs (08/23/2023)  Utilities: Not At Risk (08/23/2023)  Tobacco Use: High Risk (08/23/2023)     Readmission Risk Interventions    07/18/2023    2:27 PM  Readmission Risk Prevention Plan  Transportation Screening Complete  PCP or Specialist Appt within 3-5 Days Complete  HRI or Home Care Consult Complete  Social Work Consult for Recovery Care  Planning/Counseling Complete  Palliative Care Screening Not Applicable  Medication Review [employer]) Complete

## 2023-08-27 NOTE — Progress Notes (Signed)
Patient discharged to [facility] skilled Nursing facility,report called and given to Brynda Greathouse LPN. Patient transported by [facility] team to awaiting facility.IV discontinued,catheter intact.Staff to accompany patient to awaiting vehicle.

## 2023-08-27 NOTE — Discharge Summary (Signed)
Physician Discharge Summary  Kenneth Lester ZOX:[id number] DOB: [birth year] DOA: 08/23/2023  PCP: Kenneth Perches, MD  Admit date: 08/23/2023  Discharge date: 08/27/2023  Admitted From:Home  Disposition:  SNF  Recommendations for Outpatient Follow-up:  Follow up with PCP in 1-2 weeks Please obtain BMP in 1 week and remain on sodium chloride tablets as prescribed twice a day for now.  Monitor serum sodium levels to see if further dosages required. Continue other home medications as prior  Home Health: None  Equipment/Devices: None  Discharge Condition:Stable  CODE STATUS: Full  Diet recommendation: Regular diet  Brief/Interim Summary: Kenneth Lester is a 67 y.o. male with medical history significant of history of alcohol abuse, declines alcohol use at this time, hypertension, tobacco use disorder, and more presents to the ED with a chief complaint of dizziness.  He was admitted with generalized weakness in the setting of acute hyponatremia that appear to be related to bland diet.  Patient improved with the use of some IV fluid as well as now maintenance on sodium chloride tablets.  He has been advised to remain on regular diet with sodium chloride supplementation as prescribed for 7 days and he will need repeat monitoring of sodium levels in approximately 1 week.  PT recommending SNF on discharge on account of significant weakness and he is in stable condition for discharge today.  No other acute events or concerns noted throughout the course of this admission.  Discharge Diagnoses:  Principal Problem:   Acute hyponatremia Active Problems:   Tobacco use disorder   Essential hypertension   Dizziness   Generalized weakness  Principal discharge diagnosis: Symptomatic hyponatremia with generalized weakness in the setting of bland diet/poor oral intake.  Discharge Instructions  Discharge Instructions     Diet - low sodium heart healthy   Complete by: As directed    Increase  activity slowly   Complete by: As directed       Allergies as of 08/27/2023       Reactions   Other Other (See Comments)   Patient states he was allergic to something he was given in an IV at the hospital when being treated for pneumonia, but he doesn't remember what the name of the medication was. 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        Medication List     STOP taking these medications    Vitamin D (Ergocalciferol) 1.25 MG (50000 UNIT) Caps capsule Commonly known as: DRISDOL       TAKE these medications    amLODipine 5 MG tablet Commonly known as: NORVASC Take 1 tablet (5 mg total) by mouth daily.   cyanocobalamin 1000 MCG tablet Take 1 tablet (1,000 mcg total) by mouth daily.   Lactulose 20 GM/30ML Soln Take 30 mLs (20 g total) by mouth daily as needed (constipation).   losartan 25 MG tablet Commonly known as: COZAAR Take 1 tablet (25 mg total) by mouth daily.   sodium chloride 1 g tablet Take 1 tablet (1 g total) by mouth 2 (two) times daily with a meal for 7 days.        Follow-up Information     Kenneth Perches, MD. Schedule an appointment as soon as possible for a visit in 1 week(s).   Specialty: Internal Medicine Contact information: [address] [place] [address] [phone]                Allergies  Allergen Reactions   Other Other (  See Comments)    Patient states he was allergic to something he was given in an IV at the hospital when being treated for pneumonia, but he doesn't remember what the name of the medication was. 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    Consultations: None   Procedures/Studies: CT Head Wo Contrast  Result Date: 08/23/2023 CLINICAL DATA:  Vertigo, central EXAM: CT HEAD WITHOUT CONTRAST TECHNIQUE: Contiguous axial images were obtained from the base of the skull through the vertex without intravenous contrast. RADIATION DOSE REDUCTION: This exam was performed according to the  departmental dose-optimization program which includes automated exposure control, adjustment of the mA and/or kV according to patient size and/or use of iterative reconstruction technique. COMPARISON:  CT head October 21, 24. FINDINGS: Brain: No evidence of acute infarction, hemorrhage, hydrocephalus, extra-axial collection or mass lesion/mass effect. Patchy white matter and deep gray hypodensities are nonspecific but compatible with chronic microvascular ischemic disease. Vascular: No hyperdense vessel. Skull: No acute fracture. Sinuses/Orbits: Mostly clear sinuses.  No acute orbital findings. Other: No mastoid effusions. IMPRESSION: 1. No evidence of acute intracranial abnormality. 2. Similar advanced chronic microvascular ischemic disease, further characterized on prior MRI. Electronically Signed   By: Feliberto Harts M.D.   On: 08/23/2023 16:31   CT Head Wo Contrast  Result Date: 08/13/2023 CLINICAL DATA:  Dizziness, Syncope/presyncope, cerebrovascular cause suspected EXAM: CT HEAD WITHOUT CONTRAST TECHNIQUE: Contiguous axial images were obtained from the base of the skull through the vertex without intravenous contrast. RADIATION DOSE REDUCTION: This exam was performed according to the departmental dose-optimization program which includes automated exposure control, adjustment of the mA and/or kV according to patient size and/or use of iterative reconstruction technique. COMPARISON:  MRI 05/22/2023, CT 04/30/2023 FINDINGS: Brain: Mild parenchymal volume loss is again seen globally, stable since prior examination. 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 No evidence of acute intracranial hemorrhage or infarct. No abnormal mass  effect or midline shift. No abnormal intra or extra-axial mass lesion. Ventricular size is normal. Cerebellum is unremarkable. Vascular: No hyperdense vessel or unexpected calcification. Skull: Normal. Negative for fracture or focal lesion. Sinuses/Orbits: No acute finding. Other: Mastoid air cells and middle ear cavities are clear. IMPRESSION: 1. No acute intracranial abnormality. 2. 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 Electronically Signed   By: Helyn Numbers M.D.   On: 08/13/2023 19:47     Discharge Exam: Vitals:   08/27/23 0525 08/27/23 0801  BP: (!) 153/89 137/80  Pulse: 74 61  Resp: 16 18  Temp: 99.2 F (37.3 C) 98.3 F (36.8 C)  SpO2: 100% 100%   Vitals:   08/26/23 0917 08/26/23 2205 08/27/23 0525 08/27/23 0801  BP: (!) 167/81 (!) 154/81 (!) 153/89 137/80  Pulse: 75 62 74 61  Resp: 16 16 16 18   Temp:  98.8 F (37.1 C) 99.2 F (37.3 C) 98.3 F (36.8 C)  TempSrc:  Oral Oral   SpO2: 100% 99% 100% 100%  Weight:      Height:        General: Pt is alert, awake, not  in acute distress Cardiovascular: RRR, S1/S2 +, no rubs, no gallops Respiratory: CTA bilaterally, no wheezing, no rhonchi Abdominal: Soft, NT, ND, bowel sounds + Extremities: no edema, no cyanosis    The results of significant diagnostics from this hospitalization (including imaging, microbiology, ancillary and laboratory) are listed below for reference.     Microbiology: No results found for this or any previous visit (from the past 240 hour(s)).   Labs: BNP (last 3 results) No results for input(s): "BNP" in the last 8760 hours. Basic Metabolic Panel: Recent Labs  Lab 08/23/23 1322 08/23/23 1833 08/24/23 0358 08/25/23 0948  08/27/23 0436  NA 127* 130* 132* 136 134*  K 3.9 4.3 4.0 4.9 3.6  CL 95* 101 103 102 105  CO2 24 22 21* 25 23  GLUCOSE 100* 97 89 128* 88  BUN 6* 5* 7* 6* 8  CREATININE 0.50* 0.43* 0.49* 0.62 0.57*  CALCIUM 9.4 9.0 8.9 9.5 8.9  MG  --  2.2  --  2.0  --    Liver Function Tests: Recent Labs  Lab 08/24/23 0358  AST 13*  ALT 12  ALKPHOS 51  BILITOT 0.5  PROT 6.3*  ALBUMIN 3.4*   No results for input(s): "LIPASE", "AMYLASE" in the last 168 hours. No results for input(s): "AMMONIA" in the last 168 hours. CBC: Recent Labs  Lab 08/23/23 1322 08/24/23 0358 08/25/23 0948  WBC 11.4* 7.6 6.2  NEUTROABS 8.3* 5.2  --   HGB 13.1 11.9* 12.3*  HCT 38.5* 34.4* 37.2*  MCV 91.0 89.4 91.6  PLT 330 298 278   Cardiac Enzymes: No results for input(s): "CKTOTAL", "CKMB", "CKMBINDEX", "TROPONINI" in the last 168 hours. BNP: Invalid input(s): "POCBNP" CBG: No results for input(s): "GLUCAP" in the last 168 hours. D-Dimer No results for input(s): "DDIMER" in the last 72 hours. Hgb A1c No results for input(s): "HGBA1C" in the last 72 hours. Lipid Profile No results for input(s): "CHOL", "HDL", "LDLCALC", "TRIG", "CHOLHDL", "LDLDIRECT" in the last 72 hours. Thyroid function studies No results for input(s): "TSH", "T4TOTAL", "T3FREE", "THYROIDAB" in the last 72 hours.  Invalid input(s): "FREET3" Anemia work up No results for input(s): "VITAMINB12", "FOLATE", "FERRITIN", "TIBC", "IRON", "RETICCTPCT" in the last 72 hours. Urinalysis    Component Value Date/Time   COLORURINE YELLOW 08/13/2023 1659   APPEARANCEUR CLEAR 08/13/2023 1659   LABSPEC 1.009 08/13/2023 1659   PHURINE 7.0 08/13/2023 1659   GLUCOSEU NEGATIVE 08/13/2023 1659   HGBUR NEGATIVE 08/13/2023 1659   BILIRUBINUR NEGATIVE 08/13/2023 1659   KETONESUR 5 (A) 08/13/2023 1659   PROTEINUR NEGATIVE 08/13/2023 1659   NITRITE NEGATIVE 08/13/2023 1659   LEUKOCYTESUR NEGATIVE 08/13/2023 1659   Sepsis Labs Recent Labs  Lab  08/23/23 1322 08/24/23 0358 08/25/23 0948  WBC 11.4* 7.6 6.2   Microbiology No results found for this or any previous visit (from the past 240 hour(s)).   Time coordinating discharge: 35 minutes  SIGNED:   Erick Blinks, DO [facility] 08/27/2023, 10:28 AM  If 7PM-7AM, please contact night-coverage [email or web address]

## 2023-08-29 DIAGNOSIS — R531 Weakness: Secondary | ICD-10-CM | POA: Diagnosis not present

## 2023-08-29 DIAGNOSIS — E538 Deficiency of other specified B group vitamins: Secondary | ICD-10-CM | POA: Diagnosis not present

## 2023-08-29 DIAGNOSIS — I10 Essential (primary) hypertension: Secondary | ICD-10-CM | POA: Diagnosis not present

## 2023-08-29 DIAGNOSIS — E871 Hypo-osmolality and hyponatremia: Secondary | ICD-10-CM | POA: Diagnosis not present

## 2023-08-30 DIAGNOSIS — E538 Deficiency of other specified B group vitamins: Secondary | ICD-10-CM | POA: Diagnosis not present

## 2023-08-30 DIAGNOSIS — R531 Weakness: Secondary | ICD-10-CM | POA: Diagnosis not present

## 2023-08-30 DIAGNOSIS — E871 Hypo-osmolality and hyponatremia: Secondary | ICD-10-CM | POA: Diagnosis not present

## 2023-08-30 DIAGNOSIS — I10 Essential (primary) hypertension: Secondary | ICD-10-CM | POA: Diagnosis not present

## 2023-08-31 DIAGNOSIS — I10 Essential (primary) hypertension: Secondary | ICD-10-CM | POA: Diagnosis not present

## 2023-08-31 DIAGNOSIS — E871 Hypo-osmolality and hyponatremia: Secondary | ICD-10-CM | POA: Diagnosis not present

## 2023-08-31 DIAGNOSIS — E538 Deficiency of other specified B group vitamins: Secondary | ICD-10-CM | POA: Diagnosis not present

## 2023-08-31 DIAGNOSIS — R531 Weakness: Secondary | ICD-10-CM | POA: Diagnosis not present

## 2023-09-03 DIAGNOSIS — F4323 Adjustment disorder with mixed anxiety and depressed mood: Secondary | ICD-10-CM | POA: Diagnosis not present

## 2023-09-03 DIAGNOSIS — E871 Hypo-osmolality and hyponatremia: Secondary | ICD-10-CM | POA: Diagnosis not present

## 2023-09-03 DIAGNOSIS — I10 Essential (primary) hypertension: Secondary | ICD-10-CM | POA: Diagnosis not present

## 2023-09-03 DIAGNOSIS — E538 Deficiency of other specified B group vitamins: Secondary | ICD-10-CM | POA: Diagnosis not present

## 2023-09-03 DIAGNOSIS — R531 Weakness: Secondary | ICD-10-CM | POA: Diagnosis not present

## 2023-09-05 DIAGNOSIS — R531 Weakness: Secondary | ICD-10-CM | POA: Diagnosis not present

## 2023-09-05 DIAGNOSIS — E538 Deficiency of other specified B group vitamins: Secondary | ICD-10-CM | POA: Diagnosis not present

## 2023-09-05 DIAGNOSIS — E871 Hypo-osmolality and hyponatremia: Secondary | ICD-10-CM | POA: Diagnosis not present

## 2023-09-05 DIAGNOSIS — I10 Essential (primary) hypertension: Secondary | ICD-10-CM | POA: Diagnosis not present

## 2023-09-07 DIAGNOSIS — E871 Hypo-osmolality and hyponatremia: Secondary | ICD-10-CM | POA: Diagnosis not present

## 2023-09-07 DIAGNOSIS — R531 Weakness: Secondary | ICD-10-CM | POA: Diagnosis not present

## 2023-09-07 DIAGNOSIS — E538 Deficiency of other specified B group vitamins: Secondary | ICD-10-CM | POA: Diagnosis not present

## 2023-09-07 DIAGNOSIS — I10 Essential (primary) hypertension: Secondary | ICD-10-CM | POA: Diagnosis not present

## 2023-09-15 DIAGNOSIS — Z556 Problems related to health literacy: Secondary | ICD-10-CM | POA: Diagnosis not present

## 2023-09-15 DIAGNOSIS — B538 Other malaria, not elsewhere classified: Secondary | ICD-10-CM | POA: Diagnosis not present

## 2023-09-15 DIAGNOSIS — I10 Essential (primary) hypertension: Secondary | ICD-10-CM | POA: Diagnosis not present

## 2023-09-15 DIAGNOSIS — K5900 Constipation, unspecified: Secondary | ICD-10-CM | POA: Diagnosis not present

## 2023-09-15 DIAGNOSIS — Z8781 Personal history of (healed) traumatic fracture: Secondary | ICD-10-CM | POA: Diagnosis not present

## 2023-09-15 DIAGNOSIS — F1010 Alcohol abuse, uncomplicated: Secondary | ICD-10-CM | POA: Diagnosis not present

## 2023-09-15 DIAGNOSIS — E538 Deficiency of other specified B group vitamins: Secondary | ICD-10-CM | POA: Diagnosis not present

## 2023-09-15 DIAGNOSIS — E871 Hypo-osmolality and hyponatremia: Secondary | ICD-10-CM | POA: Diagnosis not present

## 2023-09-15 DIAGNOSIS — F17200 Nicotine dependence, unspecified, uncomplicated: Secondary | ICD-10-CM | POA: Diagnosis not present

## 2023-09-18 DIAGNOSIS — F1020 Alcohol dependence, uncomplicated: Secondary | ICD-10-CM | POA: Diagnosis not present

## 2023-09-19 DIAGNOSIS — I10 Essential (primary) hypertension: Secondary | ICD-10-CM | POA: Diagnosis not present

## 2023-09-19 DIAGNOSIS — K5900 Constipation, unspecified: Secondary | ICD-10-CM | POA: Diagnosis not present

## 2023-09-19 DIAGNOSIS — E538 Deficiency of other specified B group vitamins: Secondary | ICD-10-CM | POA: Diagnosis not present

## 2023-09-19 DIAGNOSIS — F1010 Alcohol abuse, uncomplicated: Secondary | ICD-10-CM | POA: Diagnosis not present

## 2023-09-19 DIAGNOSIS — F17200 Nicotine dependence, unspecified, uncomplicated: Secondary | ICD-10-CM | POA: Diagnosis not present

## 2023-09-19 DIAGNOSIS — E871 Hypo-osmolality and hyponatremia: Secondary | ICD-10-CM | POA: Diagnosis not present

## 2023-09-19 DIAGNOSIS — Z8781 Personal history of (healed) traumatic fracture: Secondary | ICD-10-CM | POA: Diagnosis not present

## 2023-09-19 DIAGNOSIS — Z556 Problems related to health literacy: Secondary | ICD-10-CM | POA: Diagnosis not present

## 2023-09-24 DIAGNOSIS — Z8781 Personal history of (healed) traumatic fracture: Secondary | ICD-10-CM | POA: Diagnosis not present

## 2023-09-24 DIAGNOSIS — K5900 Constipation, unspecified: Secondary | ICD-10-CM | POA: Diagnosis not present

## 2023-09-24 DIAGNOSIS — F1010 Alcohol abuse, uncomplicated: Secondary | ICD-10-CM | POA: Diagnosis not present

## 2023-09-24 DIAGNOSIS — E871 Hypo-osmolality and hyponatremia: Secondary | ICD-10-CM | POA: Diagnosis not present

## 2023-09-24 DIAGNOSIS — Z556 Problems related to health literacy: Secondary | ICD-10-CM | POA: Diagnosis not present

## 2023-09-24 DIAGNOSIS — E538 Deficiency of other specified B group vitamins: Secondary | ICD-10-CM | POA: Diagnosis not present

## 2023-09-24 DIAGNOSIS — F17200 Nicotine dependence, unspecified, uncomplicated: Secondary | ICD-10-CM | POA: Diagnosis not present

## 2023-09-24 DIAGNOSIS — I10 Essential (primary) hypertension: Secondary | ICD-10-CM | POA: Diagnosis not present

## 2023-09-27 DIAGNOSIS — F1010 Alcohol abuse, uncomplicated: Secondary | ICD-10-CM | POA: Diagnosis not present

## 2023-09-27 DIAGNOSIS — E871 Hypo-osmolality and hyponatremia: Secondary | ICD-10-CM | POA: Diagnosis not present

## 2023-09-27 DIAGNOSIS — E538 Deficiency of other specified B group vitamins: Secondary | ICD-10-CM | POA: Diagnosis not present

## 2023-09-27 DIAGNOSIS — Z556 Problems related to health literacy: Secondary | ICD-10-CM | POA: Diagnosis not present

## 2023-09-27 DIAGNOSIS — K5900 Constipation, unspecified: Secondary | ICD-10-CM | POA: Diagnosis not present

## 2023-09-27 DIAGNOSIS — I10 Essential (primary) hypertension: Secondary | ICD-10-CM | POA: Diagnosis not present

## 2023-09-27 DIAGNOSIS — Z8781 Personal history of (healed) traumatic fracture: Secondary | ICD-10-CM | POA: Diagnosis not present

## 2023-09-27 DIAGNOSIS — F17200 Nicotine dependence, unspecified, uncomplicated: Secondary | ICD-10-CM | POA: Diagnosis not present

## 2023-09-28 DIAGNOSIS — Z8781 Personal history of (healed) traumatic fracture: Secondary | ICD-10-CM | POA: Diagnosis not present

## 2023-09-28 DIAGNOSIS — E871 Hypo-osmolality and hyponatremia: Secondary | ICD-10-CM | POA: Diagnosis not present

## 2023-09-28 DIAGNOSIS — F17200 Nicotine dependence, unspecified, uncomplicated: Secondary | ICD-10-CM | POA: Diagnosis not present

## 2023-09-28 DIAGNOSIS — K5900 Constipation, unspecified: Secondary | ICD-10-CM | POA: Diagnosis not present

## 2023-09-28 DIAGNOSIS — Z556 Problems related to health literacy: Secondary | ICD-10-CM | POA: Diagnosis not present

## 2023-09-28 DIAGNOSIS — E538 Deficiency of other specified B group vitamins: Secondary | ICD-10-CM | POA: Diagnosis not present

## 2023-09-28 DIAGNOSIS — I10 Essential (primary) hypertension: Secondary | ICD-10-CM | POA: Diagnosis not present

## 2023-09-28 DIAGNOSIS — F1010 Alcohol abuse, uncomplicated: Secondary | ICD-10-CM | POA: Diagnosis not present

## 2023-10-02 DIAGNOSIS — F1010 Alcohol abuse, uncomplicated: Secondary | ICD-10-CM | POA: Diagnosis not present

## 2023-10-02 DIAGNOSIS — F17200 Nicotine dependence, unspecified, uncomplicated: Secondary | ICD-10-CM | POA: Diagnosis not present

## 2023-10-02 DIAGNOSIS — K5900 Constipation, unspecified: Secondary | ICD-10-CM | POA: Diagnosis not present

## 2023-10-02 DIAGNOSIS — E538 Deficiency of other specified B group vitamins: Secondary | ICD-10-CM | POA: Diagnosis not present

## 2023-10-02 DIAGNOSIS — Z556 Problems related to health literacy: Secondary | ICD-10-CM | POA: Diagnosis not present

## 2023-10-02 DIAGNOSIS — Z8781 Personal history of (healed) traumatic fracture: Secondary | ICD-10-CM | POA: Diagnosis not present

## 2023-10-02 DIAGNOSIS — E871 Hypo-osmolality and hyponatremia: Secondary | ICD-10-CM | POA: Diagnosis not present

## 2023-10-02 DIAGNOSIS — I10 Essential (primary) hypertension: Secondary | ICD-10-CM | POA: Diagnosis not present

## 2023-10-09 DIAGNOSIS — E538 Deficiency of other specified B group vitamins: Secondary | ICD-10-CM | POA: Diagnosis not present

## 2023-10-09 DIAGNOSIS — E871 Hypo-osmolality and hyponatremia: Secondary | ICD-10-CM | POA: Diagnosis not present

## 2023-10-09 DIAGNOSIS — F1010 Alcohol abuse, uncomplicated: Secondary | ICD-10-CM | POA: Diagnosis not present

## 2023-10-09 DIAGNOSIS — K5900 Constipation, unspecified: Secondary | ICD-10-CM | POA: Diagnosis not present

## 2023-10-09 DIAGNOSIS — Z8781 Personal history of (healed) traumatic fracture: Secondary | ICD-10-CM | POA: Diagnosis not present

## 2023-10-09 DIAGNOSIS — I10 Essential (primary) hypertension: Secondary | ICD-10-CM | POA: Diagnosis not present

## 2023-10-09 DIAGNOSIS — F17200 Nicotine dependence, unspecified, uncomplicated: Secondary | ICD-10-CM | POA: Diagnosis not present

## 2023-10-09 DIAGNOSIS — Z556 Problems related to health literacy: Secondary | ICD-10-CM | POA: Diagnosis not present

## 2023-10-15 DIAGNOSIS — E538 Deficiency of other specified B group vitamins: Secondary | ICD-10-CM | POA: Diagnosis not present

## 2023-10-15 DIAGNOSIS — Z8781 Personal history of (healed) traumatic fracture: Secondary | ICD-10-CM | POA: Diagnosis not present

## 2023-10-15 DIAGNOSIS — I10 Essential (primary) hypertension: Secondary | ICD-10-CM | POA: Diagnosis not present

## 2023-10-15 DIAGNOSIS — F1010 Alcohol abuse, uncomplicated: Secondary | ICD-10-CM | POA: Diagnosis not present

## 2023-10-15 DIAGNOSIS — E871 Hypo-osmolality and hyponatremia: Secondary | ICD-10-CM | POA: Diagnosis not present

## 2023-10-15 DIAGNOSIS — F17200 Nicotine dependence, unspecified, uncomplicated: Secondary | ICD-10-CM | POA: Diagnosis not present

## 2023-10-15 DIAGNOSIS — K5900 Constipation, unspecified: Secondary | ICD-10-CM | POA: Diagnosis not present

## 2023-10-15 DIAGNOSIS — Z556 Problems related to health literacy: Secondary | ICD-10-CM | POA: Diagnosis not present

## 2023-10-19 DIAGNOSIS — F17200 Nicotine dependence, unspecified, uncomplicated: Secondary | ICD-10-CM | POA: Diagnosis not present

## 2023-10-19 DIAGNOSIS — Z556 Problems related to health literacy: Secondary | ICD-10-CM | POA: Diagnosis not present

## 2023-10-19 DIAGNOSIS — I10 Essential (primary) hypertension: Secondary | ICD-10-CM | POA: Diagnosis not present

## 2023-10-19 DIAGNOSIS — K5900 Constipation, unspecified: Secondary | ICD-10-CM | POA: Diagnosis not present

## 2023-10-19 DIAGNOSIS — Z8781 Personal history of (healed) traumatic fracture: Secondary | ICD-10-CM | POA: Diagnosis not present

## 2023-10-19 DIAGNOSIS — E538 Deficiency of other specified B group vitamins: Secondary | ICD-10-CM | POA: Diagnosis not present

## 2023-10-19 DIAGNOSIS — F1010 Alcohol abuse, uncomplicated: Secondary | ICD-10-CM | POA: Diagnosis not present

## 2023-10-19 DIAGNOSIS — E871 Hypo-osmolality and hyponatremia: Secondary | ICD-10-CM | POA: Diagnosis not present

## 2023-10-25 DIAGNOSIS — I10 Essential (primary) hypertension: Secondary | ICD-10-CM | POA: Diagnosis not present

## 2023-10-25 DIAGNOSIS — F1010 Alcohol abuse, uncomplicated: Secondary | ICD-10-CM | POA: Diagnosis not present

## 2023-10-25 DIAGNOSIS — Z8781 Personal history of (healed) traumatic fracture: Secondary | ICD-10-CM | POA: Diagnosis not present

## 2023-10-25 DIAGNOSIS — Z556 Problems related to health literacy: Secondary | ICD-10-CM | POA: Diagnosis not present

## 2023-10-25 DIAGNOSIS — E871 Hypo-osmolality and hyponatremia: Secondary | ICD-10-CM | POA: Diagnosis not present

## 2023-10-25 DIAGNOSIS — F17200 Nicotine dependence, unspecified, uncomplicated: Secondary | ICD-10-CM | POA: Diagnosis not present

## 2023-10-25 DIAGNOSIS — K5900 Constipation, unspecified: Secondary | ICD-10-CM | POA: Diagnosis not present

## 2023-10-25 DIAGNOSIS — E538 Deficiency of other specified B group vitamins: Secondary | ICD-10-CM | POA: Diagnosis not present

## 2023-10-30 DIAGNOSIS — F1010 Alcohol abuse, uncomplicated: Secondary | ICD-10-CM | POA: Diagnosis not present

## 2023-10-30 DIAGNOSIS — E871 Hypo-osmolality and hyponatremia: Secondary | ICD-10-CM | POA: Diagnosis not present

## 2023-10-30 DIAGNOSIS — K5900 Constipation, unspecified: Secondary | ICD-10-CM | POA: Diagnosis not present

## 2023-10-30 DIAGNOSIS — Z8781 Personal history of (healed) traumatic fracture: Secondary | ICD-10-CM | POA: Diagnosis not present

## 2023-10-30 DIAGNOSIS — F17200 Nicotine dependence, unspecified, uncomplicated: Secondary | ICD-10-CM | POA: Diagnosis not present

## 2023-10-30 DIAGNOSIS — I10 Essential (primary) hypertension: Secondary | ICD-10-CM | POA: Diagnosis not present

## 2023-10-30 DIAGNOSIS — E538 Deficiency of other specified B group vitamins: Secondary | ICD-10-CM | POA: Diagnosis not present

## 2023-10-30 DIAGNOSIS — Z556 Problems related to health literacy: Secondary | ICD-10-CM | POA: Diagnosis not present

## 2023-11-09 DIAGNOSIS — E538 Deficiency of other specified B group vitamins: Secondary | ICD-10-CM | POA: Diagnosis not present

## 2023-11-09 DIAGNOSIS — E871 Hypo-osmolality and hyponatremia: Secondary | ICD-10-CM | POA: Diagnosis not present

## 2023-11-09 DIAGNOSIS — K5900 Constipation, unspecified: Secondary | ICD-10-CM | POA: Diagnosis not present

## 2023-11-09 DIAGNOSIS — F17200 Nicotine dependence, unspecified, uncomplicated: Secondary | ICD-10-CM | POA: Diagnosis not present

## 2023-11-09 DIAGNOSIS — I10 Essential (primary) hypertension: Secondary | ICD-10-CM | POA: Diagnosis not present

## 2023-11-09 DIAGNOSIS — Z556 Problems related to health literacy: Secondary | ICD-10-CM | POA: Diagnosis not present

## 2023-11-09 DIAGNOSIS — Z8781 Personal history of (healed) traumatic fracture: Secondary | ICD-10-CM | POA: Diagnosis not present

## 2023-11-09 DIAGNOSIS — F1010 Alcohol abuse, uncomplicated: Secondary | ICD-10-CM | POA: Diagnosis not present

## 2024-02-12 ENCOUNTER — Observation Stay (HOSPITAL_COMMUNITY)
Admission: EM | Admit: 2024-02-12 | Discharge: 2024-02-14 | Disposition: A | Discharge status: Home / Self Care | Attending: Family Medicine | Admitting: Family Medicine

## 2024-02-12 ENCOUNTER — Emergency Department (HOSPITAL_COMMUNITY)

## 2024-02-12 ENCOUNTER — Other Ambulatory Visit: Payer: Self-pay

## 2024-02-12 ENCOUNTER — Observation Stay (HOSPITAL_COMMUNITY)

## 2024-02-12 ENCOUNTER — Encounter (HOSPITAL_COMMUNITY): Payer: Self-pay

## 2024-02-12 DIAGNOSIS — E871 Hypo-osmolality and hyponatremia: Principal | ICD-10-CM | POA: Insufficient documentation

## 2024-02-12 DIAGNOSIS — I6782 Cerebral ischemia: Secondary | ICD-10-CM | POA: Diagnosis not present

## 2024-02-12 DIAGNOSIS — F17210 Nicotine dependence, cigarettes, uncomplicated: Secondary | ICD-10-CM | POA: Diagnosis not present

## 2024-02-12 DIAGNOSIS — I10 Essential (primary) hypertension: Secondary | ICD-10-CM | POA: Diagnosis not present

## 2024-02-12 DIAGNOSIS — Z79899 Other long term (current) drug therapy: Secondary | ICD-10-CM | POA: Diagnosis not present

## 2024-02-12 DIAGNOSIS — R42 Dizziness and giddiness: Principal | ICD-10-CM

## 2024-02-12 DIAGNOSIS — R2681 Unsteadiness on feet: Secondary | ICD-10-CM | POA: Insufficient documentation

## 2024-02-12 DIAGNOSIS — Z8659 Personal history of other mental and behavioral disorders: Secondary | ICD-10-CM | POA: Insufficient documentation

## 2024-02-12 DIAGNOSIS — R531 Weakness: Secondary | ICD-10-CM | POA: Diagnosis not present

## 2024-02-12 DIAGNOSIS — F17200 Nicotine dependence, unspecified, uncomplicated: Secondary | ICD-10-CM | POA: Diagnosis not present

## 2024-02-12 DIAGNOSIS — Z1152 Encounter for screening for COVID-19: Secondary | ICD-10-CM | POA: Diagnosis not present

## 2024-02-12 DIAGNOSIS — R55 Syncope and collapse: Secondary | ICD-10-CM | POA: Diagnosis not present

## 2024-02-12 LAB — TSH: TSH: 1.289 u[IU]/mL (ref 0.350–4.500)

## 2024-02-12 LAB — COMPREHENSIVE METABOLIC PANEL WITH GFR
ALT: 25 U/L (ref 0–44)
AST: 20 U/L (ref 15–41)
Albumin: 4.4 g/dL (ref 3.5–5.0)
Alkaline Phosphatase: 64 U/L (ref 38–126)
Anion gap: 11 (ref 5–15)
BUN: 9 mg/dL (ref 8–23)
CO2: 23 mmol/L (ref 22–32)
Calcium: 9.5 mg/dL (ref 8.9–10.3)
Chloride: 93 mmol/L — ABNORMAL LOW (ref 98–111)
Creatinine, Ser: 0.53 mg/dL — ABNORMAL LOW (ref 0.61–1.24)
GFR, Estimated: >60 mL/min (ref >60)
Glucose, Bld: 89 mg/dL (ref 70–99)
Potassium: 3.8 mmol/L (ref 3.5–5.1)
Sodium: 127 mmol/L — ABNORMAL LOW (ref 135–145)
Total Bilirubin: 0.7 mg/dL (ref 0.0–1.2)
Total Protein: 7.9 g/dL (ref 6.5–8.1)

## 2024-02-12 LAB — URINALYSIS, ROUTINE W REFLEX MICROSCOPIC
Bilirubin Urine: NEGATIVE
Glucose, UA: NEGATIVE mg/dL
Hgb urine dipstick: NEGATIVE
Ketones, ur: NEGATIVE mg/dL
Leukocytes,Ua: NEGATIVE
Nitrite: NEGATIVE
Protein, ur: NEGATIVE mg/dL
Specific Gravity, Urine: 1.011 (ref 1.005–1.030)
pH: 7 (ref 5.0–8.0)

## 2024-02-12 LAB — CK: Total CK: 95 U/L (ref 49–397)

## 2024-02-12 LAB — CBG MONITORING, ED: Glucose-Capillary: 109 mg/dL — ABNORMAL HIGH (ref 70–99)

## 2024-02-12 LAB — CBC WITH DIFFERENTIAL/PLATELET
Abs Immature Granulocytes: 0.06 10*3/uL (ref 0.00–0.07)
Basophils Absolute: 0 10*3/uL (ref 0.0–0.1)
Basophils Relative: 0 %
Eosinophils Absolute: 0.9 10*3/uL — ABNORMAL HIGH (ref 0.0–0.5)
Eosinophils Relative: 10 %
HCT: 44.8 % (ref 39.0–52.0)
Hemoglobin: 15.1 g/dL (ref 13.0–17.0)
Immature Granulocytes: 1 %
Lymphocytes Relative: 22 %
Lymphs Abs: 2 10*3/uL (ref 0.7–4.0)
MCH: 29.8 pg (ref 26.0–34.0)
MCHC: 33.7 g/dL (ref 30.0–36.0)
MCV: 88.4 fL (ref 80.0–100.0)
Monocytes Absolute: 0.6 10*3/uL (ref 0.1–1.0)
Monocytes Relative: 7 %
Neutro Abs: 5.3 10*3/uL (ref 1.7–7.7)
Neutrophils Relative %: 60 %
Platelets: 253 10*3/uL (ref 150–400)
RBC: 5.07 MIL/uL (ref 4.22–5.81)
RDW: 14.8 % (ref 11.5–15.5)
WBC: 8.8 10*3/uL (ref 4.0–10.5)
nRBC: 0 % (ref 0.0–0.2)

## 2024-02-12 LAB — RESP PANEL BY RT-PCR (RSV, FLU A&B, COVID)  RVPGX2
Influenza A by PCR: NEGATIVE
Influenza B by PCR: NEGATIVE
Resp Syncytial Virus by PCR: NEGATIVE
SARS Coronavirus 2 by RT PCR: NEGATIVE

## 2024-02-12 LAB — CREATININE, URINE, RANDOM: Creatinine, Urine: 74 mg/dL

## 2024-02-12 LAB — VITAMIN B12: Vitamin B-12: 331 pg/mL (ref 180–914)

## 2024-02-12 LAB — TROPONIN I (HIGH SENSITIVITY): Troponin I (High Sensitivity): 3 ng/L (ref <18)

## 2024-02-12 LAB — ETHANOL: Alcohol, Ethyl (B): <15 mg/dL (ref <15)

## 2024-02-12 LAB — FOLATE: Folate: 10.3 ng/mL (ref >5.9)

## 2024-02-12 LAB — SODIUM, URINE, RANDOM: Sodium, Ur: 45 mmol/L

## 2024-02-12 MED ORDER — ONDANSETRON HCL 4 MG/2ML IJ SOLN: 4.0000 mg | Freq: Four times a day (QID) | INTRAMUSCULAR | Status: DC | PRN

## 2024-02-12 MED ORDER — ACETAMINOPHEN 650 MG RE SUPP: 650.0000 mg | Freq: Four times a day (QID) | RECTAL | Status: DC | PRN

## 2024-02-12 MED ORDER — HEPARIN SODIUM (PORCINE) 5000 UNIT/ML IJ SOLN
5000.0000 [IU] | Freq: Three times a day (TID) | INTRAMUSCULAR | Status: DC
  Administered 2024-02-12 – 2024-02-13 (×3): 5000 [IU] via SUBCUTANEOUS
  Filled 2024-02-12 (×4): qty 1

## 2024-02-12 MED ORDER — POTASSIUM CHLORIDE IN NACL 20-0.9 MEQ/L-% IV SOLN: INTRAVENOUS | Status: AC

## 2024-02-12 MED ORDER — SODIUM CHLORIDE 0.9 % IV BOLUS
1000.0000 mL | Freq: Once | INTRAVENOUS | Status: AC
  Administered 2024-02-12: 1000 mL via INTRAVENOUS

## 2024-02-12 MED ORDER — ACETAMINOPHEN 325 MG PO TABS: 650.0000 mg | ORAL_TABLET | Freq: Four times a day (QID) | ORAL | Status: DC | PRN

## 2024-02-12 MED ORDER — ONDANSETRON HCL 4 MG PO TABS
4.0000 mg | ORAL_TABLET | Freq: Four times a day (QID) | ORAL | Status: DC | PRN
Start: 2024-02-12 — End: 2024-02-14

## 2024-02-12 NOTE — Hospital Course (Signed)
 68 year old male with a history of alcohol abuse, tobacco abuse, hypertension presenting with dizziness and generalized weakness for about a week.  He states that his dizziness has worsened in the past 24 hours.  He is having difficulty standing up.  He states that changing any positions and moving his head also triggers worsening dizziness.  He denies any new medications.  He continues to smoke 1 pack/day.  He denies any headache, chest pain, worsening shortness of breath, nausea, vomiting, diarrhea.  He states that his appetite is good.  He denies any focal extremity weakness.  He denies any new dysesthesias.  He denies any dysarthria. Notably, the patient was admitted to the hospital from 08/23/2023 to 08/27/2023 secondary to similar presentation.  At that time, he was discharged to a skilled nursing facility.  It was felt that his hyponatremia may have been due to Poor solute intake.  The patient was started on sodium chloride  tablets time.  Nevertheless, he has not followed up with his primary care provider since then.  He denies any illicit drug use. In the ED, the patient was afebrile and hemodynamically stable with oxygen saturation 100% room air.  WBC 8.8, hemoglobin 15.1, platelets 253.  Sodium 127, potassium 3.8, bicarbonate 23, serum creatinine 0.53.  LFTs were unremarkable.  CT of the brain was negative for any acute findings.  It was felt that the patient was unsafe for discharge home.  As result the patient was admitted for further evaluation and treatment of his unsteady gait, generalized weakness, and dizziness.

## 2024-02-12 NOTE — H&P (Signed)
 History and Physical    Patient: Kenneth Lester:[id number] DOB: 12/05/1955 DOA: 02/12/2024 DOS: the patient was seen and examined on 02/12/2024 PCP: [name], MD  Patient coming from: Home  Chief Complaint:  Chief Complaint  Patient presents with   Dizziness   HPI: Kenneth Lester is a 68 year old male with a history of alcohol abuse, tobacco abuse, hypertension presenting with dizziness and generalized weakness for about a week.  He states that his dizziness has worsened in the past 24 hours.  He is having difficulty standing up.  He states that changing any positions and moving his head also triggers worsening dizziness.  He denies any new medications.  He continues to smoke 1 pack/day.  He denies any headache, chest pain, worsening shortness of breath, nausea, vomiting, diarrhea.  He states that his appetite is good.  He denies any focal extremity weakness.  He denies any new dysesthesias.  He denies any dysarthria. Notably, the patient was admitted to the hospital from 08/23/2023 to 08/27/2023 secondary to similar presentation.  At that time, he was discharged to a skilled nursing facility.  It was felt that his hyponatremia may have been due to Poor solute intake.  The patient was started on sodium chloride  tablets time.  Nevertheless, he has not followed up with his primary care provider since then.  He denies any illicit drug use. In the ED, the patient was afebrile and hemodynamically stable with oxygen saturation 100% room air.  WBC 8.8, hemoglobin 15.1, platelets 253.  Sodium 127, potassium 3.8, bicarbonate 23, serum creatinine 0.53.  LFTs were unremarkable.  CT of the brain was negative for any acute findings.  It was felt that the patient was unsafe for discharge home.  As result the patient was admitted for further evaluation and treatment of his unsteady gait, generalized weakness, and dizziness.  Review of Systems: As mentioned in the history of present illness. All other  systems reviewed and are negative. Past Medical History:  Diagnosis Date   Alcohol abuse    Hypertension    Use of cane as ambulatory aid    Vertigo    Past Surgical History:  Procedure Laterality Date   COLONOSCOPY  2009   Dr. Riley Cheadle: difficult prep (patient ate solid food during prep). single hyperplastic polyps removed.   MOUTH SURGERY     teeth pulled for dentures, pt unsure of date   ORIF WRIST FRACTURE Left 09/22/2021   Procedure: OPEN REDUCTION INTERNAL FIXATION (ORIF) WRIST FRACTURE;  Surgeon: Ltanya Rummer, MD;  Location: [facility];  Service: Orthopedics;  Laterality: Left;  with MAC   right ankle surgery  2006   forklift injury   Social History:  reports that he has been smoking cigarettes. He has a 50 pack-year smoking history. He has never been exposed to tobacco smoke. He has never used smokeless tobacco. He reports that he does not currently use alcohol after a past usage of about 7.0 standard drinks of alcohol per week. He reports that he does not use drugs.  Allergies  Allergen Reactions   Other Other (See Comments)    Patient states he was allergic to something he was given in an IV at the hospital when being treated for pneumonia, but he doesn't remember what the name of the medication was. 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    Family History  Problem Relation Age of Onset   Colon cancer Neg Hx     Prior to  Admission medications   Medication Sig Start Date End Date Taking? Authorizing Provider  sodium chloride  1 g tablet Take 1 g by mouth 2 (two) times daily. 09/10/23  Yes [provider]  Vitamin D , Ergocalciferol , (DRISDOL ) 1.25 MG (50000 UNIT) CAPS capsule Take 50,000 Units by mouth once a week. 09/17/23  Yes [provider]  amLODipine  (NORVASC ) 5 MG tablet Take 1 tablet (5 mg total) by mouth daily. 07/19/23   Johnson, Clanford L, MD  cyanocobalamin  1000 MCG tablet Take 1 tablet (1,000 mcg total) by mouth daily.  07/24/23   Johnson, Clanford L, MD  lactulose , encephalopathy, (CHRONULAC ) 10 GM/15ML SOLN Take 10 g by mouth daily as needed (constipation). 12/11/23   [provider]  losartan  (COZAAR ) 25 MG tablet Take 1 tablet (25 mg total) by mouth daily. 07/19/23   Rayfield Cairo, MD    Physical Exam: Vitals:   02/12/24 1236 02/12/24 1237  BP: 134/83   Pulse: 72   Resp: 18   Temp: (!) 97.5 F (36.4 C)   TempSrc: Oral   SpO2: 100%   Weight:  60.8 kg  Height:  5\' 8"  (1.727 m)   GENERAL:  A&O x 3, NAD, well developed, cooperative, follows commands HEENT: [place]/AT, No thrush, No icterus, No oral ulcers Neck:  No neck mass, No meningismus, soft, supple CV: RRR, no S3, no S4, no rub, no JVD Lungs:  CTA, no wheeze, no rhonchi, good air movement Abd: soft/NT +BS, nondistended Ext: No edema, no lymphangitis, no cyanosis, no rashes Neuro:  CN II-XII intact, strength 4/5 in RUE, RLE, strength 4/5 LUE, LLE; sensation intact bilateral; no dysmetria; babinski equivocal  Data Reviewed: Data reviewed above in the history  Assessment and Plan: Dizziness/generalized weakness -This is likely multifactorial including hyponatremia, deconditioning versus neurologic syndrome versus vestibular neuritis - Check orthostatic vital sign - B12 - Folic acid  - TSH - MRI brain - Thiamine  level - CPK - Check COVID-19  Hyponatremia - There appears to be a chronic component dating back to 03/2022 - Urine osmolarity - Serum osmolarity - Urine sodium - 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 cortisol - Start isotonic fluid for now and monitor - Obtain chest x-ray  Tobacco abuse - Tobacco cessation discussed  Essential hypertension - Holding ARB - Holding amlodipine  temporarily  Gait instability - PT evaluation        Advance Care Planning: FULL  Consults: none  Family Communication: none  Severity of Illness: The appropriate patient status for this patient is OBSERVATION. Observation  status is judged to be reasonable and necessary in order to provide the required intensity of service to ensure the patient's safety. The patient's presenting symptoms, physical exam findings, and initial radiographic and laboratory data in the context of their medical condition is felt to place them at decreased risk for further clinical deterioration. Furthermore, it is anticipated that the patient will be medically stable for discharge from the hospital within 2 midnights of admission.   Author: Demaris Fillers, MD 02/12/2024 5:29 PM  For on call review [email or web address].

## 2024-02-12 NOTE — Progress Notes (Signed)
 Notified Dr. Adefeso that patient refused to go down to radiology for tests. No new orders received at this time.  Patient did allow covid swab and blood work to be done at this time.

## 2024-02-12 NOTE — ED Provider Notes (Signed)
 [facility] AT [facility] Provider Note   CSN: [id number] Arrival date & time: 02/12/24  1231     History  Chief Complaint  Patient presents with   Dizziness    Kenneth Lester is a 68 y.o. male.  Patient is a 68 year old male who presents to the emergency department the chief complaint of generalized weakness and dizziness.  Patient notes that symptoms have been ongoing for the past few weeks but have been getting progressively worse.  He notes that he is unable to ambulate at his baseline secondary to the weakness and dizziness.  Patient denies any recent alcohol use.  He denies any associated chest pain, shortness of breath, Donnell pain, nausea, vomiting, diarrhea.  He denies any numbness or paresthesias.  He denies any unilateral weakness.  He has had no associated falls.   Dizziness      Home Medications Prior to Admission medications   Medication Sig Start Date End Date Taking? Authorizing Provider  amLODipine  (NORVASC ) 5 MG tablet Take 1 tablet (5 mg total) by mouth daily. 07/19/23   Johnson, Clanford L, MD  cyanocobalamin  1000 MCG tablet Take 1 tablet (1,000 mcg total) by mouth daily. 07/24/23   Johnson, Clanford L, MD  Lactulose  20 GM/30ML SOLN Take 30 mLs (20 g total) by mouth daily as needed (constipation). 07/19/23   Johnson, Clanford L, MD  losartan  (COZAAR ) 25 MG tablet Take 1 tablet (25 mg total) by mouth daily. 07/19/23   Rayfield Cairo, MD      Allergies    Other    Review of Systems   Review of Systems  Neurological:  Positive for dizziness.    Physical Exam Updated Vital Signs BP 134/83 (BP Location: Right Arm)   Pulse 72   Temp (!) 97.5 F (36.4 C) (Oral)   Resp 18   Ht 5\' 8"  (1.727 m)   Wt 60.8 kg   SpO2 100%   BMI 20.38 kg/m  Physical Exam Vitals and nursing note reviewed.  Constitutional:      Appearance: Normal appearance.  HENT:     Head: Normocephalic and atraumatic.     Nose: Nose normal.      Mouth/Throat:     Mouth: Mucous membranes are moist.  Eyes:     Extraocular Movements: Extraocular movements intact.     Conjunctiva/sclera: Conjunctivae normal.     Pupils: Pupils are equal, round, and reactive to light.  Cardiovascular:     Rate and Rhythm: Normal rate and regular rhythm.     Pulses: Normal pulses.     Heart sounds: Normal heart sounds. No murmur heard.    No gallop.  Pulmonary:     Effort: Pulmonary effort is normal. No respiratory distress.     Breath sounds: Normal breath sounds. No stridor. No wheezing, rhonchi or rales.  Abdominal:     General: Abdomen is flat. Bowel sounds are normal. There is no distension.     Palpations: Abdomen is soft.     Tenderness: There is no abdominal tenderness. There is no guarding.  Musculoskeletal:        General: Normal range of motion.     Cervical back: Normal range of motion and neck supple.  Skin:    General: Skin is warm and dry.  Neurological:     General: No focal deficit present.     Mental Status: He is alert and oriented to person, place, and time. Mental status is at baseline.  Cranial Nerves: No cranial nerve deficit.     Sensory: No sensory deficit.     Motor: No weakness.     Coordination: Coordination normal.     Gait: Gait abnormal.  Psychiatric:        Mood and Affect: Mood normal.        Behavior: Behavior normal.        Thought Content: Thought content normal.        Judgment: Judgment normal.     ED Results / Procedures / Treatments   Labs (all labs ordered are listed, but only abnormal results are displayed) Labs Reviewed  CBC WITH DIFFERENTIAL/PLATELET - Abnormal; Notable for the following components:      Result Value   Eosinophils Absolute 0.9 (*)    All other components within normal limits  COMPREHENSIVE METABOLIC PANEL WITH GFR - Abnormal; Notable for the following components:   Sodium 127 (*)    Chloride 93 (*)    Creatinine, Ser 0.53 (*)    All other components within normal  limits  CBG MONITORING, ED - Abnormal; Notable for the following components:   Glucose-Capillary 109 (*)    All other components within normal limits  TROPONIN I (HIGH SENSITIVITY)    EKG None  Radiology No results found.  Procedures Procedures    Medications Ordered in ED Medications  sodium chloride  0.9 % bolus 1,000 mL (has no administration in time range)    ED Course/ Medical Decision Making/ A&P                                 Medical Decision Making Amount and/or Complexity of Data Reviewed Labs: ordered. Radiology: ordered.  Risk Decision regarding hospitalization.   This patient presents to the ED for concern of, generalized weakness, this involves an extensive number of treatment options, and is a complaint that carries with it a high risk of complications and morbidity.  The differential diagnosis includes electrolyte derangement, acute kidney injury, sepsis, pneumonia, urinary tract infection, ACS, CVA, TIA   Co morbidities that complicate the patient evaluation  Previous hyponatremia, noncompliant with home medications   Additional history obtained:  Additional history obtained from medical records External records from outside source obtained and reviewed including none   Lab Tests:  I Ordered, and personally interpreted labs.  The pertinent results include: Hyponatremia, normal kidney function liver function, no leukocytosis, no anemia, normal troponin   Imaging Studies ordered:  I ordered imaging studies including CT scan of the head I independently visualized and interpreted imaging which showed no acute intracranial process I agree with the radiologist interpretation   Cardiac Monitoring: / EKG:  The patient was maintained on a cardiac monitor.  I personally viewed and interpreted the cardiac monitored which showed an underlying rhythm of: Normal sinus rhythm, no ST/T wave changes, no ischemic changes, no STEMI   Consultations  Obtained:  I requested consultation with the hospitalist,  and discussed lab and imaging findings as well as pertinent plan - they recommend: Admission   Problem List / ED Course / Critical interventions / Medication management  Patient does remain stable at this time.  Discussed with patient we will plan for admission to the hospital service given his symptomatic hyponatremia.  Patient notes that symptoms feel similar to his previous admission for this.  Patient adamantly denies alcohol use.  CT scan of the head demonstrated no signs of acute process.  Feel  that CVA or TIA is less likely at this point.  Neurological exam is otherwise unremarkable.  Patient has had no other associated symptoms suggest an underlying infectious cause of his symptoms.  Urinalysis is pending at this time.  Did discuss patient case with Dr. Winferd Hatter who has excepted for admission. I ordered medication including normal saline for hyponatremia Reevaluation of the patient after these medicines showed that the patient improved I have reviewed the patients home medicines and have made adjustments as needed   Social Determinants of Health:  None   Test / Admission - Considered:  Admission        Final Clinical Impression(s) / ED Diagnoses Final diagnoses:  None    Rx / DC Orders ED Discharge Orders     None         Emmalene Hare 02/12/24 1748    Carin Charleston, MD 02/13/24 [phone]

## 2024-02-12 NOTE — ED Triage Notes (Signed)
 Pt arrived via [facility] from home, c/o dizziness, weakness, and reports he has not seen his PCP in about a year. Pt reports he is hungry in Triage.

## 2024-02-12 NOTE — Progress Notes (Signed)
 Pt very frustrated and irritated stating he will not be answering any admission questions until he gets something to eat and some rest. He is very tiered and people has been talking and asking questions all day long.   Frozen meal tray with crackers and drink provided. Will attempt later in the shift to ask admission questions.   Belonging at bedside includes, eyeglasses. Cane, house shoes, and clothing.

## 2024-02-13 ENCOUNTER — Observation Stay (HOSPITAL_COMMUNITY)

## 2024-02-13 DIAGNOSIS — R0602 Shortness of breath: Secondary | ICD-10-CM | POA: Diagnosis not present

## 2024-02-13 DIAGNOSIS — R42 Dizziness and giddiness: Secondary | ICD-10-CM | POA: Diagnosis not present

## 2024-02-13 DIAGNOSIS — R9089 Other abnormal findings on diagnostic imaging of central nervous system: Secondary | ICD-10-CM | POA: Diagnosis not present

## 2024-02-13 DIAGNOSIS — F17200 Nicotine dependence, unspecified, uncomplicated: Secondary | ICD-10-CM | POA: Diagnosis not present

## 2024-02-13 DIAGNOSIS — E871 Hypo-osmolality and hyponatremia: Secondary | ICD-10-CM | POA: Diagnosis not present

## 2024-02-13 DIAGNOSIS — R531 Weakness: Secondary | ICD-10-CM | POA: Diagnosis not present

## 2024-02-13 DIAGNOSIS — I10 Essential (primary) hypertension: Secondary | ICD-10-CM | POA: Diagnosis not present

## 2024-02-13 LAB — BASIC METABOLIC PANEL WITH GFR
Anion gap: 6 (ref 5–15)
BUN: 9 mg/dL (ref 8–23)
CO2: 22 mmol/L (ref 22–32)
Calcium: 8.6 mg/dL — ABNORMAL LOW (ref 8.9–10.3)
Chloride: 104 mmol/L (ref 98–111)
Creatinine, Ser: 0.55 mg/dL — ABNORMAL LOW (ref 0.61–1.24)
GFR, Estimated: >60 mL/min (ref >60)
Glucose, Bld: 89 mg/dL (ref 70–99)
Potassium: 4.4 mmol/L (ref 3.5–5.1)
Sodium: 132 mmol/L — ABNORMAL LOW (ref 135–145)

## 2024-02-13 LAB — CORTISOL-AM, BLOOD: Cortisol - AM: 11.1 ug/dL (ref 6.7–22.6)

## 2024-02-13 LAB — T4, FREE: Free T4: 0.89 ng/dL (ref 0.61–1.12)

## 2024-02-13 LAB — OSMOLALITY, URINE: Osmolality, Ur: 387 mosm/kg (ref 300–900)

## 2024-02-13 LAB — OSMOLALITY: Osmolality: 278 mosm/kg (ref 275–295)

## 2024-02-13 MED ORDER — AMLODIPINE BESYLATE 5 MG PO TABS
5.0000 mg | ORAL_TABLET | Freq: Every day | ORAL | Status: DC
  Administered 2024-02-13 – 2024-02-14 (×2): 5 mg via ORAL
  Filled 2024-02-13 (×2): qty 1

## 2024-02-13 MED ORDER — MECLIZINE HCL 12.5 MG PO TABS
12.5000 mg | ORAL_TABLET | Freq: Three times a day (TID) | ORAL | Status: DC
  Administered 2024-02-13 – 2024-02-14 (×2): 12.5 mg via ORAL
  Filled 2024-02-13 (×3): qty 1

## 2024-02-13 NOTE — Progress Notes (Signed)
 Pt irritated but agreeable for sacral and groin skin check this morning. This care RN did this assessment without witness of another RN/LPN as patient refused another person being in the room.   No abnormal skin issues noted during exam of sacral, buttocks, and groin area

## 2024-02-13 NOTE — Discharge Instructions (Addendum)
 Food Agency Name: [facility] Address: [address] [place], [place] [address] Phone: [phone] Website: www.adtsrc.org Service(s) Offered: Meals on [employer] and Meals with Friends.  Home care, at home assisted living, volunteer services, Center  for Active Retirement, transportation  Agency Name: [facility] Address: Sites vary. Much call first. Food Pantry locations: [address] [place], [place] [address] [employer] Phone: [phone] Website: none Service(s) Offered: Museum/gallery curator, utility assistance if funds available Careers adviser for all of [place], [employer], [employer], Office manager and [employer] for [employer] area only) Walk-in  current Id and current address verification required. WedThurs: 9:30-12:00  Agency Name: [facility] Address: [address] [place], [place] [address] Phone: [phone] or [phone] Website: none Service(s) Offered: Food assistance.  Agency Name: [facility] Address: [address] [place], [place] [address] Phone: [phone] Website: [email or web address].[email or web address] Service(s) Offered: Food assistance Monday Nights 5:00PM-5:30PM  Agency Name: [facility] Address: [address], [place], [place] [address] Phone: [phone] or [phone] Website: none Service(s) Offered: Serves 1 hot meal a day at 11:00 am Monday-Sunday and 5 pm  on the second and fourth Sunday of each month  Agency Name: [facility] Department of Health and Human/Social Services Address: [address], [place], [place] [address] Phone: [phone] Website: [email or web address]  Service(s) Offered: Sales executive, WIC program.  Agency Name: Holiday representative of [facility] Address: [address], [place], [place] / [address] [place], [place]  February 12, 2023 7 Phone: [phone] [place] / [phone] [place] Website:  OpinionTrades.tn [email or web address].co.za  Service(s) Offered: Civil Service fast streamer, food pantry, soup kitchen ([place]) emergency financial  assistance, thrift stores, showers & hygiene products ([place]),  Christmas Assistance, spiritual help.  Agency Name: [facility] Address: [address] [place], [place] [address] Phone: [phone] Website: [email or web address] Service(s) Offered: Food pantry Tuesday, Wednesday and Thursday 9am-11:30am  (need appointment) and health clinic (9:00am-11:00am)  Transportation Agency Name: [facility] [place].  Address: [address] [place], [place] [address]  Phone: [phone]  Website: www.adtsrc.org Services Offered: Meals on [employer]. Home care, at home assisted  living, volunteer services, Center for Active Retirement,   RCATS and SKAT transportation system.  Agency Name: [facility] Transportation  Address: [address], [place], [place] [address]  Phone: [phone]  Website: www.[place]transportation.[email or web address] Services Offered: Transportation for a fee  [employer] (wheelchair transport) [phone] Private pay: $65 round-trip  [employer]  [phone]     IMPORTANT INFORMATION: PAY CLOSE ATTENTION   PHYSICIAN DISCHARGE INSTRUCTIONS  Follow with Primary care provider  [name], MD  and other consultants as instructed by your Hospitalist Physician  SEEK MEDICAL CARE OR RETURN TO EMERGENCY ROOM IF SYMPTOMS COME BACK, WORSEN OR NEW PROBLEM DEVELOPS   Please note: You were cared for by a hospitalist during your hospital stay. Every effort will be made to forward records to your primary care provider.  You can request that your primary care provider send for your hospital records if they have not received them.  Once you are discharged, your primary care physician will handle any further medical issues. Please note that NO REFILLS for any  discharge medications will be authorized once you are discharged, as it is imperative that you return to your primary care physician (or establish a relationship with a primary care physician if you do not have one) for your post hospital discharge needs  so that they can reassess your need for medications and monitor your lab values.  Please get a complete blood count and chemistry panel checked by your Primary MD at your next visit, and again as instructed by your Primary MD.  Get Medicines reviewed and adjusted: Please take all your medications with you for your next visit with your Primary MD  Laboratory/radiological data: Please request your Primary MD to go over all hospital tests and procedure/radiological results at the follow up, please ask your primary care provider to get all Hospital records sent to his/her office.  In some cases, they will be blood work, cultures and biopsy results pending at the time of your discharge. Please request that your primary care provider follow up on these results.  If you are diabetic, please bring your blood sugar readings with you to your follow up appointment with primary care.    Please call and make your follow up appointments as soon as possible.    Also Note the following: If you experience worsening of your admission symptoms, develop shortness of breath, life threatening emergency, suicidal or homicidal thoughts you must seek medical attention immediately by calling 911 or calling your MD immediately  if symptoms less severe.  You must read complete instructions/literature along with all the possible adverse reactions/side effects for all the Medicines you take and that have been prescribed to you. Take any new Medicines after you have completely understood and accpet all the possible adverse reactions/side effects.   Do not drive when taking Pain medications or sleeping medications (Benzodiazepines)  Do not take more than prescribed Pain,  Sleep and Anxiety Medications. It is not advisable to combine anxiety,sleep and pain medications without talking with your primary care practitioner  Special Instructions: If you have smoked or chewed Tobacco  in the last 2 yrs please stop smoking, stop any regular Alcohol  and or any Recreational drug use.  Wear Seat belts while driving.  Do not drive if taking any narcotic, mind altering or controlled substances or recreational drugs or alcohol.

## 2024-02-13 NOTE — TOC Progression Note (Signed)
 Transition of Care [facility]) - Progression Note    Patient Details  Name: Kenneth Lester MRN: [id number] Date of Birth: 1955/12/17  [facility] CM/SW Contact  Geraldina Klinefelter, RN Phone Number: 02/13/2024, 8:17 PM  Clinical Narrative:    Attempted to speak c/pt about discharge plan. Pt is obstinate, his attention wanders and he has difficulty answering basic questions. Pt granted verbal permission to speak c/his wife about HHPT. Spoke c/Gloria Chad at [phone]. Mrs. Moodie stated pt has had HHPT before and was uncooperative and she doesn't  think this time will be any different. It was agreed that pt will dc s/HHPT. Informed wife that should the need arise for PT after dc, she can contact pt's PCP for a HHPT referral. Wife verbalized understanding. Also spoke c/wife about pt's reported food insecurity and that he had implied that he lives alone.  Wife stated that there is plenty of food in the house at any given time. She works first shift and makes sure pt is provided for while she is gone and she is home for dinner in the evenings.    Barriers to Discharge: Continued Medical Work up  Expected Discharge Plan and Services   Social Determinants of Health (SDOH) Interventions SDOH Screenings   Food Insecurity: Food Insecurity Present (08/23/2023)  Housing: Low Risk  (08/23/2023)  Transportation Needs: Unmet Transportation Needs (08/23/2023)  Utilities: Not At Risk (08/23/2023)  Tobacco Use: High Risk (02/12/2024)    Readmission Risk Interventions

## 2024-02-13 NOTE — Plan of Care (Signed)

## 2024-02-13 NOTE — Progress Notes (Signed)
 MRI attempted to get patient down for MRI of head but patient refused. Care RN attempted to reason with patient but patient stated,"I am here to rest and you all will not let me rest".   MRI tech will be notifying Dr. Winferd Hatter via message system. They will attempt again later in the day if patient is willing.  Dr. Elyse Hand notified of refusal.

## 2024-02-13 NOTE — Progress Notes (Signed)
 Patient refusing to come down to MRI. Patient does not wish to have test. RN and MD aware. MD will re-order exam if needed at a later time and if patient is agreeable.

## 2024-02-13 NOTE — Progress Notes (Signed)
 Pt refused complete skin check. Care nurse was able to only perform skin check of visible  torso and bilateral feet.   Will notify Charge RN Bree of this refusal.

## 2024-02-13 NOTE — Progress Notes (Signed)
 Assisted patient to and from bathroom,using front wheel walker,patient has some generalized weakness and pain he c/o to the right foot when walking.Plan of care on going.

## 2024-02-13 NOTE — Progress Notes (Signed)
 Nurse at bedside,patient refused Heparin  injection at this time,Dr Tat notified.OPlaqn of care on going.

## 2024-02-13 NOTE — Progress Notes (Addendum)
 PROGRESS NOTE  Kenneth Lester OZH:[id number] DOB: 30-May-1956 DOA: 02/12/2024 PCP: [name], MD  Brief History:  68 year old male with a history of alcohol abuse, tobacco abuse, hypertension presenting with dizziness and generalized weakness for about a week.  He states that his dizziness has worsened in the past 24 hours.  He is having difficulty standing up.  He states that changing any positions and moving his head also triggers worsening dizziness.  He denies any new medications.  He continues to smoke 1 pack/day.  He denies any headache, chest pain, worsening shortness of breath, nausea, vomiting, diarrhea.  He states that his appetite is good.  He denies any focal extremity weakness.  He denies any new dysesthesias.  He denies any dysarthria. Notably, the patient was admitted to the hospital from 08/23/2023 to 08/27/2023 secondary to similar presentation.  At that time, he was discharged to a skilled nursing facility.  It was felt that his hyponatremia may have been due to Poor solute intake.  The patient was started on sodium chloride  tablets time.  Nevertheless, he has not followed up with his primary care provider since then.  He denies any illicit drug use. In the ED, the patient was afebrile and hemodynamically stable with oxygen saturation 100% room air.  WBC 8.8, hemoglobin 15.1, platelets 253.  Sodium 127, potassium 3.8, bicarbonate 23, serum creatinine 0.53.  LFTs were unremarkable.  CT of the brain was negative for any acute findings.  It was felt that the patient was unsafe for discharge home.  As result the patient was admitted for further evaluation and treatment of his unsteady gait, generalized weakness, and dizziness.   Assessment/Plan: Dizziness/generalized weakness -This is likely multifactorial including hyponatremia, deconditioning versus BPPV - Check orthostatic vital sign--neg - G29-528 - Folic acid --10.3 - TSH--1.289 - MRI brain--initially refused.  No  agrees - Thiamine  level--pending - CPK--95 - Check COVID-19--neg -PT eval   Hyponatremia - There appears to be a chronic component dating back to 03/2022 - Urine osmolarity--pending - Serum osmolarity--result pending - Urine sodium--45 - A.m. cortisol--11.1 - Start isotonic fluid for now and monitor>>improving - Obtain chest x-ray--results pending   Tobacco abuse - Tobacco cessation discussed   Essential hypertension - Holding ARB - restart amlodipine    Gait instability - PT evaluation              Family Communication:  no Family at bedside  Consultants:  none  Code Status:  FULL   DVT Prophylaxis:  [place] Heparin    Procedures: As Listed in Progress Note Above  Antibiotics: None      Subjective: Pt states his dizziness persists, particularly with positional change and lifting and moving his head  Objective: Vitals:   02/13/24 0001 02/13/24 0428 02/13/24 0735 02/13/24 0942  BP: 97/62 103/70 113/63 118/65  Pulse: 83 62 60 74  Resp: 16 14 18 18   Temp: 98.5 F (36.9 C) 97.8 F (36.6 C) 98.3 F (36.8 C) 98.6 F (37 C)  TempSrc:   Oral Oral  SpO2: 98% 100% 99% 99%  Weight:  60.8 kg    Height:        Intake/Output Summary (Last 24 hours) at 02/13/2024 1153 Last data filed at 02/13/2024 0500 Gross per 24 hour  Intake 480 ml  Output 300 ml  Net 180 ml   Weight change:  Exam:  General:  Pt is alert, follows commands appropriately, not in acute distress HEENT: No icterus, No  thrush, No neck mass, [place]/AT Cardiovascular: RRR, S1/S2, no rubs, no gallops Respiratory: CTA bilaterally, no wheezing, no crackles, no rhonchi Abdomen: Soft/+BS, non tender, non distended, no guarding Extremities: No edema, No lymphangitis, No petechiae, No rashes, no synovitis Neuro:  CN II-XII intact, strength 4/5 in RUE, RLE, strength 4/5 LUE, LLE; sensation intact bilateral; no dysmetria; babinski equivocal    Data Reviewed: I have personally reviewed following labs  and imaging studies Basic Metabolic Panel: Recent Labs  Lab 02/12/24 1413 02/13/24 0423  NA 127* 132*  K 3.8 4.4  CL 93* 104  CO2 23 22  GLUCOSE 89 89  BUN 9 9  CREATININE 0.53* 0.55*  CALCIUM 9.5 8.6*   Liver Function Tests: Recent Labs  Lab 02/12/24 1413  AST 20  ALT 25  ALKPHOS 64  BILITOT 0.7  PROT 7.9  ALBUMIN 4.4   No results for input(s): "LIPASE", "AMYLASE" in the last 168 hours. No results for input(s): "AMMONIA" in the last 168 hours. Coagulation Profile: No results for input(s): "INR", "PROTIME" in the last 168 hours. CBC: Recent Labs  Lab 02/12/24 1413  WBC 8.8  NEUTROABS 5.3  HGB 15.1  HCT 44.8  MCV 88.4  PLT 253   Cardiac Enzymes: Recent Labs  Lab 02/12/24 1619  CKTOTAL 95   BNP: Invalid input(s): "POCBNP" CBG: Recent Labs  Lab 02/12/24 1241  GLUCAP 109*   HbA1C: No results for input(s): "HGBA1C" in the last 72 hours. Urine analysis:    Component Value Date/Time   COLORURINE YELLOW 02/12/2024 2215   APPEARANCEUR CLEAR 02/12/2024 2215   LABSPEC 1.011 02/12/2024 2215   PHURINE 7.0 02/12/2024 2215   GLUCOSEU NEGATIVE 02/12/2024 2215   HGBUR NEGATIVE 02/12/2024 2215   BILIRUBINUR NEGATIVE 02/12/2024 2215   KETONESUR NEGATIVE 02/12/2024 2215   PROTEINUR NEGATIVE 02/12/2024 2215   NITRITE NEGATIVE 02/12/2024 2215   LEUKOCYTESUR NEGATIVE 02/12/2024 2215   Sepsis Labs: @LABRCNTIP (procalcitonin:4,lacticidven:4) ) Recent Results (from the past 240 hours)  Resp panel by RT-PCR (RSV, Flu A&B, Covid) Anterior Nasal Swab     Status: None   Collection Time: 02/12/24  8:45 PM   Specimen: Anterior Nasal Swab  Result Value Ref Range Status   SARS Coronavirus 2 by RT PCR NEGATIVE NEGATIVE Final    Comment: (NOTE) SARS-CoV-2 target nucleic acids are NOT DETECTED.  The SARS-CoV-2 RNA is generally detectable in upper respiratory specimens during the acute phase of infection. The lowest concentration of SARS-CoV-2 viral copies this assay  can detect is 138 copies/mL. A negative result does not preclude SARS-Cov-2 infection and should not be used as the sole basis for treatment or other patient management decisions. A negative result may occur with  improper specimen collection/handling, submission of specimen other than nasopharyngeal swab, presence of viral mutation(s) within the areas targeted by this assay, and inadequate number of viral copies(<138 copies/mL). A negative result must be combined with clinical observations, patient history, and epidemiological information. The expected result is Negative.  Fact Sheet for Patients:  BloggerCourse.com  Fact Sheet for Healthcare Providers:  SeriousBroker.it  This test is no t yet approved or cleared by the [place]  FDA and  has been authorized for detection and/or diagnosis of SARS-CoV-2 by FDA under an Emergency Use Authorization (EUA). This EUA will remain  in effect (meaning this test can be used) for the duration of the COVID-19 declaration under Section 564(b)(1) of the Act, 21 U.S.C.section 360bbb-3(b)(1), unless the authorization is terminated  or revoked sooner.  Influenza A by PCR NEGATIVE NEGATIVE Final   Influenza B by PCR NEGATIVE NEGATIVE Final    Comment: (NOTE) The Xpert Xpress SARS-CoV-2/FLU/RSV plus assay is intended as an aid in the diagnosis of influenza from Nasopharyngeal swab specimens and should not be used as a sole basis for treatment. Nasal washings and aspirates are unacceptable for Xpert Xpress SARS-CoV-2/FLU/RSV testing.  Fact Sheet for Patients: BloggerCourse.com  Fact Sheet for Healthcare Providers: SeriousBroker.it  This test is not yet approved or cleared by the [place]  FDA and has been authorized for detection and/or diagnosis of SARS-CoV-2 by FDA under an Emergency Use Authorization (EUA). This EUA will  remain in effect (meaning this test can be used) for the duration of the COVID-19 declaration under Section 564(b)(1) of the Act, 21 U.S.C. section 360bbb-3(b)(1), unless the authorization is terminated or revoked.     Resp Syncytial Virus by PCR NEGATIVE NEGATIVE Final    Comment: (NOTE) Fact Sheet for Patients: BloggerCourse.com  Fact Sheet for Healthcare Providers: SeriousBroker.it  This test is not yet approved or cleared by the [place]  FDA and has been authorized for detection and/or diagnosis of SARS-CoV-2 by FDA under an Emergency Use Authorization (EUA). This EUA will remain in effect (meaning this test can be used) for the duration of the COVID-19 declaration under Section 564(b)(1) of the Act, 21 U.S.C. section 360bbb-3(b)(1), unless the authorization is terminated or revoked.  Performed at [facility], [address], [place], [place] [address]      Scheduled Meds:  heparin   5,000 Units Subcutaneous Q8H   Continuous Infusions:  0.9 % NaCl with KCl 20 mEq / L 75 mL/hr at 02/12/24 2151    Procedures/Studies: DG Chest 2 View Result Date: 02/13/2024 CLINICAL DATA:  Shortness of breath EXAM: CHEST - 2 VIEW COMPARISON:  None Available. FINDINGS: The heart size and mediastinal contours are within normal limits. Both lungs are clear. The visualized skeletal structures show old rib fractures on the left. IMPRESSION: No active cardiopulmonary disease. Electronically Signed   By: Violeta Grey M.D.   On: 02/13/2024 11:21   CT Head Wo Contrast Result Date: 02/12/2024 CLINICAL DATA:  Syncope. EXAM: CT HEAD WITHOUT CONTRAST TECHNIQUE: Contiguous axial images were obtained from the base of the skull through the vertex without intravenous contrast. RADIATION DOSE REDUCTION: This exam was performed according to the departmental dose-optimization program which includes automated exposure control, adjustment of the mA and/or kV  according to patient size and/or use of iterative reconstruction technique. COMPARISON:  Head CT dated 08/23/2023. FINDINGS: Brain: Moderate age-related atrophy and advanced chronic microvascular ischemic changes. There is no acute intracranial hemorrhage. No mass effect midline shift. No extra-axial fluid collection. Vascular: No hyperdense vessel or unexpected calcification. Skull: Normal. Negative for fracture or focal lesion. Sinuses/Orbits: No acute finding. Other: None IMPRESSION: 1. No acute intracranial pathology. 2. Moderate age-related atrophy and advanced chronic microvascular ischemic changes. Electronically Signed   By: Angus Bark M.D.   On: 02/12/2024 16:58    Demaris Fillers, DO  [facility]  If 7PM-7AM, please contact night-coverage [email or web address] Password [facility] 02/13/2024, 11:53 AM   LOS: 0 days

## 2024-02-13 NOTE — Progress Notes (Signed)
 Will add food/transportation resources to AVS. PT pending. TOC will follow.    02/13/24 1446  TOC Brief Assessment  Insurance and Status Reviewed  Patient has primary care physician Yes  Home environment has been reviewed Lives with wife.  Prior level of function: Wife assists.  Prior/Current Home Services No current home services  Social Drivers of Health Review SDOH reviewed interventions complete  Readmission risk has been reviewed Yes  Transition of care needs no transition of care needs at this time

## 2024-02-13 NOTE — Evaluation (Signed)
 Physical Therapy Evaluation Patient Details Name: Kenneth Lester MRN: [id number] DOB: 22-Feb-1956 Today's Date: 02/13/2024  History of Present Illness  Kenneth Lester is a 68 year old male with a history of alcohol abuse, tobacco abuse, hypertension presenting with dizziness and generalized weakness for about a week.  He states that his dizziness has worsened in the past 24 hours.  He is having difficulty standing up.  He states that changing any positions and moving his head also triggers worsening dizziness.  He denies any new medications.  He continues to smoke 1 pack/day.  He denies any headache, chest pain, worsening shortness of breath, nausea, vomiting, diarrhea.  He states that his appetite is good.  He denies any focal extremity weakness.  He denies any new dysesthesias.  He denies any dysarthria.  Notably, the patient was admitted to the hospital from 08/23/2023 to 08/27/2023 secondary to similar presentation.  At that time, he was discharged to a skilled nursing facility.  It was felt that his hyponatremia may have been due to Poor solute intake.  The patient was started on sodium chloride  tablets time.  Nevertheless, he has not followed up with his primary care provider since then.  He denies any illicit drug use.   Clinical Impression  Patient demonstrates slow labored movement during functional mobility and ambulating in room with c/o mild dizziness, no loss of balance, declined to ambulate out of room due to feeling dizzy and went back to bed after therapy. Patient will benefit from continued skilled physical therapy in hospital and recommended venue below to increase strength, balance, endurance for safe ADLs and gait.         If plan is discharge home, recommend the following: A little help with walking and/or transfers;A little help with bathing/dressing/bathroom;Help with stairs or ramp for entrance;Assistance with cooking/housework   Can travel by private vehicle         Equipment Recommendations None recommended by PT  Recommendations for Other Services       Functional Status Assessment Patient has had a recent decline in their functional status and demonstrates the ability to make significant improvements in function in a reasonable and predictable amount of time.     Precautions / Restrictions Precautions Precautions: Fall Restrictions Weight Bearing Restrictions Per Provider Order: No      Mobility  Bed Mobility Overal bed mobility: Modified Independent             General bed mobility comments: HOB raised    Transfers Overall transfer level: Needs assistance Equipment used: Rolling walker (2 wheels) Transfers: Sit to/from Stand, Bed to chair/wheelchair/BSC Sit to Stand: Supervision   Step pivot transfers: Supervision       General transfer comment: slow labored movement with occasional leaning on walls while using RW    Ambulation/Gait Ambulation/Gait assistance: Supervision Gait Distance (Feet): 22 Feet Assistive device: Rolling walker (2 wheels) Gait Pattern/deviations: Decreased step length - left, Decreased stance time - right, Decreased stride length Gait velocity: slow     General Gait Details: unsteady labored movement without loss of balance, limited mostly due to c/o fatigue and mild dizziness  Stairs            Wheelchair Mobility     Tilt Bed    Modified Rankin (Stroke Patients Only)       Balance Overall balance assessment: Needs assistance Sitting-balance support: Feet supported, No upper extremity supported Sitting balance-Leahy Scale: Fair Sitting balance - Comments: fair/good seated at EOB   Standing  balance support: During functional activity, Bilateral upper extremity supported Standing balance-Leahy Scale: Fair Standing balance comment: using RW                             Pertinent Vitals/Pain Pain Assessment Pain Assessment: Faces Faces Pain Scale: Hurts little  more Pain Location: Chronic right foot, toe pain due to having foot ran over by a fork-lift, "per patient" Pain Descriptors / Indicators: Sharp, Sore, Dull, Guarding    Home Living Family/patient expects to be discharged to:: Private residence Living Arrangements: Spouse/significant other Available Help at Discharge: Family;Available PRN/intermittently Type of Home: House Home Access: Stairs to enter Entrance Stairs-Rails: Doctor, general practice of Steps: 3-4   Home Layout: One level Home Equipment: Cane - single point;Shower seat;Grab bars - tub/shower;Rolling Walker (2 wheels)      Prior Function Prior Level of Function : Needs assist       Physical Assist : ADLs (physical);Mobility (physical)     Mobility Comments: household and short distanced community ambulator using SPC or RW ADLs Comments: Independent ADL assist IADL by wife     Extremity/Trunk Assessment   Upper Extremity Assessment Upper Extremity Assessment: Overall WFL for tasks assessed    Lower Extremity Assessment Lower Extremity Assessment: Generalized weakness    Cervical / Trunk Assessment Cervical / Trunk Assessment: Normal  Communication   Communication Communication: No apparent difficulties    Cognition Arousal: Alert Behavior During Therapy: WFL for tasks assessed/performed   PT - Cognitive impairments: No apparent impairments                         Following commands: Intact       Cueing Cueing Techniques: Verbal cues     General Comments      Exercises     Assessment/Plan    PT Assessment Patient needs continued PT services  PT Problem List Decreased strength;Decreased activity tolerance;Decreased balance;Decreased mobility;Pain       PT Treatment Interventions DME instruction;Gait training;Stair training;Functional mobility training;Therapeutic activities;Therapeutic exercise;Balance training;Patient/family education    PT Goals (Current goals can  be found in the Care Plan section)  Acute Rehab PT Goals Patient Stated Goal: return home with no dizziness PT Goal Formulation: With patient Time For Goal Achievement: 02/16/24 Potential to Achieve Goals: Good    Frequency Min 3X/week     Co-evaluation               AM-PAC PT "6 Clicks" Mobility  Outcome Measure Help needed turning from your back to your side while in a flat bed without using bedrails?: None Help needed moving from lying on your back to sitting on the side of a flat bed without using bedrails?: A Little Help needed moving to and from a bed to a chair (including a wheelchair)?: A Little Help needed standing up from a chair using your arms (e.g., wheelchair or bedside chair)?: A Little Help needed to walk in hospital room?: A Little Help needed climbing 3-5 steps with a railing? : A Lot 6 Click Score: 18    End of Session   Activity Tolerance: Patient tolerated treatment well;Patient limited by fatigue Patient left: in bed;with call bell/phone within reach Nurse Communication: Mobility status PT Visit Diagnosis: Unsteadiness on feet (R26.81);Other abnormalities of gait and mobility (R26.89);Muscle weakness (generalized) (M62.81)    Time: 1610-9604 PT Time Calculation (min) (ACUTE ONLY): 23 min   Charges:   PT  Evaluation $PT Eval Moderate Complexity: 1 Mod PT Treatments $Therapeutic Activity: 23-37 mins PT General Charges $$ ACUTE PT VISIT: 1 Visit         3:46 PM, 02/13/24 Walton Guppy, MPT Physical Therapist with [facility] [phone] office [phone] mobile phone

## 2024-02-13 NOTE — Care Management Obs Status (Signed)
 MEDICARE OBSERVATION STATUS NOTIFICATION   Patient Details  Name: Kenneth Lester MRN: [id number] Date of Birth: Jun 30, 1956   Medicare Observation Status Notification Given:  Yes    Geraldina Klinefelter, RN 02/13/2024, 3:41 PM

## 2024-02-13 NOTE — Plan of Care (Signed)
  Problem: Acute Rehab PT Goals(only PT should resolve) Goal: Pt Will Go Supine/Side To Sit Outcome: Progressing Flowsheets (Taken 02/13/2024 1548) Pt will go Supine/Side to Sit: with modified independence Goal: Patient Will Transfer Sit To/From Stand Outcome: Progressing Flowsheets (Taken 02/13/2024 1548) Patient will transfer sit to/from stand: with modified independence Goal: Pt Will Transfer Bed To Chair/Chair To Bed Outcome: Progressing Flowsheets (Taken 02/13/2024 1548) Pt will Transfer Bed to Chair/Chair to Bed: with modified independence Goal: Pt Will Ambulate Outcome: Progressing Flowsheets (Taken 02/13/2024 1548) Pt will Ambulate:  50 feet  with modified independence  with supervision  with rolling walker   3:48 PM, 02/13/24 Walton Guppy, MPT Physical Therapist with [facility] [phone] office [phone] mobile phone

## 2024-02-13 NOTE — Progress Notes (Signed)
 Nurse at bedside,patient alert to self,place,and situation,confused to time.No c/o pain or discomfort noted. Patient irritable this am,stated"Why can't I get some rest and be left alone". Patient informed that we are here to help him,and the doctor may order test.Emotional support provided to patient.Plan of care on going.

## 2024-02-14 DIAGNOSIS — I10 Essential (primary) hypertension: Secondary | ICD-10-CM | POA: Diagnosis not present

## 2024-02-14 DIAGNOSIS — R531 Weakness: Secondary | ICD-10-CM | POA: Diagnosis not present

## 2024-02-14 DIAGNOSIS — R42 Dizziness and giddiness: Secondary | ICD-10-CM | POA: Diagnosis not present

## 2024-02-14 DIAGNOSIS — E871 Hypo-osmolality and hyponatremia: Secondary | ICD-10-CM | POA: Diagnosis not present

## 2024-02-14 DIAGNOSIS — F17200 Nicotine dependence, unspecified, uncomplicated: Secondary | ICD-10-CM | POA: Diagnosis not present

## 2024-02-14 LAB — BASIC METABOLIC PANEL WITH GFR
Anion gap: 7 (ref 5–15)
BUN: 8 mg/dL (ref 8–23)
CO2: 21 mmol/L — ABNORMAL LOW (ref 22–32)
Calcium: 8.8 mg/dL — ABNORMAL LOW (ref 8.9–10.3)
Chloride: 103 mmol/L (ref 98–111)
Creatinine, Ser: 0.46 mg/dL — ABNORMAL LOW (ref 0.61–1.24)
GFR, Estimated: >60 mL/min (ref >60)
Glucose, Bld: 90 mg/dL (ref 70–99)
Potassium: 3.8 mmol/L (ref 3.5–5.1)
Sodium: 131 mmol/L — ABNORMAL LOW (ref 135–145)

## 2024-02-14 LAB — MAGNESIUM: Magnesium: 2 mg/dL (ref 1.7–2.4)

## 2024-02-14 MED ORDER — MECLIZINE HCL 12.5 MG PO TABS: 12.5000 mg | ORAL_TABLET | Freq: Three times a day (TID) | ORAL | 1 refills | Status: DC | PRN

## 2024-02-14 NOTE — Progress Notes (Signed)
 Nurse and tech at the bedside with phlebotomy. Patient is refusing to allow tech to obtain vitals and phlebotomy to draw his labs. This RN explained to patient that labs were needed to eval his sodium levels. Patient agreed to labs as long as nobody talked in the room stating "Shut up, I just want to sleep. You all have been waking me up all night." He refused multiple times to allow the tech to obtain his vitals.

## 2024-02-14 NOTE — Discharge Summary (Signed)
 Physician Discharge Summary  Kenneth Lester:[id number] DOB: 07/16/1956 DOA: 02/12/2024  PCP: Kenneth Bile, MD  Admit date: 02/12/2024 Discharge date: 02/14/2024  Disposition:   Home with [name]   Recommendations for Outpatient Follow-up:  Follow up with PCP in 1 weeks Please obtain BMP/CBC in 1-2 weeks  Home Health:  PT   Discharge Condition: STABLE   CODE STATUS: FULL DIET:  heart healthy foods recommended    Brief Hospitalization Summary: Please see all hospital notes, images, labs for full details of the hospitalization. Admission provider HPI:  68 year old male with a history of alcohol abuse, tobacco abuse, hypertension presenting with dizziness and generalized weakness for about a week.  He states that his dizziness has worsened in the past 24 hours.  He is having difficulty standing up.  He states that changing any positions and moving his head also triggers worsening dizziness.  He denies any new medications.  He continues to smoke 1 pack/day.  He denies any headache, chest pain, worsening shortness of breath, nausea, vomiting, diarrhea.  He states that his appetite is good.  He denies any focal extremity weakness.  He denies any new dysesthesias.  He denies any dysarthria.  He states his dizziness persisted even since d/c from hospital in Nov 2024, worsen with positional changes or lifting his head. Notably, the patient was admitted to the hospital from 08/23/2023 to 08/27/2023 secondary to similar presentation.  At that time, he was discharged to a skilled nursing facility.  It was felt that his hyponatremia may have been due to Poor solute intake.  The patient was started on sodium chloride  tablets time.  Nevertheless, he has not followed up with his primary care provider since then.  He denies any illicit drug use. In the ED, the patient was afebrile and hemodynamically stable with oxygen saturation 100% room air.  WBC 8.8, hemoglobin 15.1, platelets 253.  Sodium 127, potassium 3.8,  bicarbonate 23, serum creatinine 0.53.  LFTs were unremarkable.  CT of the brain was negative for any acute findings.  It was felt that the patient was unsafe for discharge home.  As result the patient was admitted for further evaluation and treatment of his unsteady gait, generalized weakness, and dizziness.  Hospital Course by listed problem   Dizziness/generalized weakness -This is likely multifactorial including hyponatremia, deconditioning versus BPPV - Check orthostatic vital sign--neg - B12-331 - Folic acid --10.3 - TSH--1.289 - MRI brain--no acute findings reported  - Thiamine  level--pending - CPK--95 - Check COVID-19--neg - PT eval -- HHPT recommended and ordered    Hyponatremia - There appears to be a chronic component dating back to 03/2022 - Urine sodium--45 - A.m. cortisol--11.1 - Start isotonic fluid for now and monitor>>improved - Obtain chest x-ray--no acute findings reported    Tobacco abuse - Tobacco cessation discussed   Essential hypertension - restarted home medications   Gait instability - PT evaluation---HHPT recommended/ordered   Discharge Diagnoses:  Principal Problem:   Hyponatremia Active Problems:   Acute hyponatremia   Tobacco use disorder   Essential hypertension   Dizziness   Generalized weakness   Discharge Instructions:  Allergies as of 02/14/2024       Reactions   Other Other (See Comments)   Patient states he was allergic to something he was given in an IV at the hospital when being treated for pneumonia, but he doesn't remember what the name of the medication was. 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  Medication List     TAKE these medications    amLODipine  5 MG tablet Commonly known as: NORVASC  Take 1 tablet (5 mg total) by mouth daily.   lactulose  (encephalopathy) 10 GM/15ML Soln Commonly known as: CHRONULAC  Take 10 g by mouth daily as needed (constipation).   losartan  25 MG tablet Commonly known as:  COZAAR  Take 1 tablet (25 mg total) by mouth daily.   meclizine  12.5 MG tablet Commonly known as: ANTIVERT  Take 1 tablet (12.5 mg total) by mouth 3 (three) times daily as needed for dizziness.        Allergies  Allergen Reactions   Other Other (See Comments)    Patient states he was allergic to something he was given in an IV at the hospital when being treated for pneumonia, but he doesn't remember what the name of the medication was. 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   Allergies as of 02/14/2024       Reactions   Other Other (See Comments)   Patient states he was allergic to something he was given in an IV at the hospital when being treated for pneumonia, but he doesn't remember what the name of the medication was. 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        Medication List     TAKE these medications    amLODipine  5 MG tablet Commonly known as: NORVASC  Take 1 tablet (5 mg total) by mouth daily.   lactulose  (encephalopathy) 10 GM/15ML Soln Commonly known as: CHRONULAC  Take 10 g by mouth daily as needed (constipation).   losartan  25 MG tablet Commonly known as: COZAAR  Take 1 tablet (25 mg total) by mouth daily.   meclizine  12.5 MG tablet Commonly known as: ANTIVERT  Take 1 tablet (12.5 mg total) by mouth 3 (three) times daily as needed for dizziness.        Procedures/Studies: MR BRAIN WO CONTRAST Result Date: 02/13/2024 CLINICAL DATA:  Dizziness EXAM: MRI HEAD WITHOUT CONTRAST TECHNIQUE: Multiplanar, multiecho pulse sequences of the brain and surrounding structures were obtained without intravenous contrast. COMPARISON:  05/22/2023 FINDINGS: Brain: No acute infarct, mass effect or extra-axial collection. Fewer than 5 scattered chronic microhemorrhages in a nonspecific pattern. There is unchanged confluent hyperintense T2-weighted signal within the periventricular and deep white matter and within the brainstem. Mild volume loss. The midline  structures are normal. Vascular: Normal flow voids. Skull and upper cervical spine: Normal calvarium and skull base. Visualized upper cervical spine and soft tissues are normal. Sinuses/Orbits:No paranasal sinus fluid levels or advanced mucosal thickening. No mastoid or middle ear effusion. Normal orbits. IMPRESSION: 1. No acute intracranial abnormality. 2. Unchanged findings of chronic microvascular ischemia and volume loss. Electronically Signed   By: Juanetta Nordmann M.D.   On: 02/13/2024 20:09   DG Chest 2 View Result Date: 02/13/2024 CLINICAL DATA:  Shortness of breath EXAM: CHEST - 2 VIEW COMPARISON:  None Available. FINDINGS: The heart size and mediastinal contours are within normal limits. Both lungs are clear. The visualized skeletal structures show old rib fractures on the left. IMPRESSION: No active cardiopulmonary disease. Electronically Signed   By: Violeta Grey M.D.   On: 02/13/2024 11:21   CT Head Wo Contrast Result Date: 02/12/2024 CLINICAL DATA:  Syncope. EXAM: CT HEAD WITHOUT CONTRAST TECHNIQUE: Contiguous axial images were obtained from the base of the skull through the vertex without intravenous contrast. RADIATION DOSE REDUCTION: This exam was performed according to the departmental dose-optimization program which includes automated  exposure control, adjustment of the mA and/or kV according to patient size and/or use of iterative reconstruction technique. COMPARISON:  Head CT dated 08/23/2023. FINDINGS: Brain: Moderate age-related atrophy and advanced chronic microvascular ischemic changes. There is no acute intracranial hemorrhage. No mass effect midline shift. No extra-axial fluid collection. Vascular: No hyperdense vessel or unexpected calcification. Skull: Normal. Negative for fracture or focal lesion. Sinuses/Orbits: No acute finding. Other: None IMPRESSION: 1. No acute intracranial pathology. 2. Moderate age-related atrophy and advanced chronic microvascular ischemic changes.  Electronically Signed   By: Angus Bark M.D.   On: 02/12/2024 16:58     Subjective: Pt complaining about the quantity of food being brought to him, no other complaints.    Discharge Exam: Vitals:   02/13/24 1258 02/13/24 1941  BP: 122/62 (!) 150/84  Pulse: 64 67  Resp: 18 18  Temp: 98.4 F (36.9 C) 98.5 F (36.9 C)  SpO2: 99% 100%   Vitals:   02/13/24 0735 02/13/24 0942 02/13/24 1258 02/13/24 1941  BP: 113/63 118/65 122/62 (!) 150/84  Pulse: 60 74 64 67  Resp: 18 18 18 18   Temp: 98.3 F (36.8 C) 98.6 F (37 C) 98.4 F (36.9 C) 98.5 F (36.9 C)  TempSrc: Oral Oral Oral Oral  SpO2: 99% 99% 99% 100%  Weight:      Height:       General: Pt is alert, awake, not in acute distress Cardiovascular: normal S1/S2 +, no rubs, no gallops Respiratory: CTA bilaterally, no wheezing, no rhonchi Abdominal: Soft, NT, ND, bowel sounds + Extremities: no edema, no cyanosis Neurological: nonfocal exam.    The results of significant diagnostics from this hospitalization (including imaging, microbiology, ancillary and laboratory) are listed below for reference.     Microbiology: Recent Results (from the past 240 hours)  Resp panel by RT-PCR (RSV, Flu A&B, Covid) Anterior Nasal Swab     Status: None   Collection Time: 02/12/24  8:45 PM   Specimen: Anterior Nasal Swab  Result Value Ref Range Status   SARS Coronavirus 2 by RT PCR NEGATIVE NEGATIVE Final    Comment: (NOTE) SARS-CoV-2 target nucleic acids are NOT DETECTED.  The SARS-CoV-2 RNA is generally detectable in upper respiratory specimens during the acute phase of infection. The lowest concentration of SARS-CoV-2 viral copies this assay can detect is 138 copies/mL. A negative result does not preclude SARS-Cov-2 infection and should not be used as the sole basis for treatment or other patient management decisions. A negative result may occur with  improper specimen collection/handling, submission of specimen other than  nasopharyngeal swab, presence of viral mutation(s) within the areas targeted by this assay, and inadequate number of viral copies(<138 copies/mL). A negative result must be combined with clinical observations, patient history, and epidemiological information. The expected result is Negative.  Fact Sheet for Patients:  BloggerCourse.com  Fact Sheet for Healthcare Providers:  SeriousBroker.it  This test is no t yet approved or cleared by the [place]  FDA and  has been authorized for detection and/or diagnosis of SARS-CoV-2 by FDA under an Emergency Use Authorization (EUA). This EUA will remain  in effect (meaning this test can be used) for the duration of the COVID-19 declaration under Section 564(b)(1) of the Act, 21 U.S.C.section 360bbb-3(b)(1), unless the authorization is terminated  or revoked sooner.       Influenza A by PCR NEGATIVE NEGATIVE Final   Influenza B by PCR NEGATIVE NEGATIVE Final    Comment: (NOTE) The Xpert Xpress SARS-CoV-2/FLU/RSV plus assay  is intended as an aid in the diagnosis of influenza from Nasopharyngeal swab specimens and should not be used as a sole basis for treatment. Nasal washings and aspirates are unacceptable for Xpert Xpress SARS-CoV-2/FLU/RSV testing.  Fact Sheet for Patients: BloggerCourse.com  Fact Sheet for Healthcare Providers: SeriousBroker.it  This test is not yet approved or cleared by the [place]  FDA and has been authorized for detection and/or diagnosis of SARS-CoV-2 by FDA under an Emergency Use Authorization (EUA). This EUA will remain in effect (meaning this test can be used) for the duration of the COVID-19 declaration under Section 564(b)(1) of the Act, 21 U.S.C. section 360bbb-3(b)(1), unless the authorization is terminated or revoked.     Resp Syncytial Virus by PCR NEGATIVE NEGATIVE Final    Comment:  (NOTE) Fact Sheet for Patients: BloggerCourse.com  Fact Sheet for Healthcare Providers: SeriousBroker.it  This test is not yet approved or cleared by the [place]  FDA and has been authorized for detection and/or diagnosis of SARS-CoV-2 by FDA under an Emergency Use Authorization (EUA). This EUA will remain in effect (meaning this test can be used) for the duration of the COVID-19 declaration under Section 564(b)(1) of the Act, 21 U.S.C. section 360bbb-3(b)(1), unless the authorization is terminated or revoked.  Performed at [facility], [address], [place], [place] [address]      Labs: BNP (last 3 results) No results for input(s): "BNP" in the last 8760 hours. Basic Metabolic Panel: Recent Labs  Lab 02/12/24 1413 02/13/24 0423 02/14/24 0437  NA 127* 132* 131*  K 3.8 4.4 3.8  CL 93* 104 103  CO2 23 22 21*  GLUCOSE 89 89 90  BUN 9 9 8   CREATININE 0.53* 0.55* 0.46*  CALCIUM 9.5 8.6* 8.8*  MG  --   --  2.0   Liver Function Tests: Recent Labs  Lab 02/12/24 1413  AST 20  ALT 25  ALKPHOS 64  BILITOT 0.7  PROT 7.9  ALBUMIN 4.4   No results for input(s): "LIPASE", "AMYLASE" in the last 168 hours. No results for input(s): "AMMONIA" in the last 168 hours. CBC: Recent Labs  Lab 02/12/24 1413  WBC 8.8  NEUTROABS 5.3  HGB 15.1  HCT 44.8  MCV 88.4  PLT 253   Cardiac Enzymes: Recent Labs  Lab 02/12/24 1619  CKTOTAL 95   BNP: Invalid input(s): "POCBNP" CBG: Recent Labs  Lab 02/12/24 1241  GLUCAP 109*   D-Dimer No results for input(s): "DDIMER" in the last 72 hours. Hgb A1c No results for input(s): "HGBA1C" in the last 72 hours. Lipid Profile No results for input(s): "CHOL", "HDL", "LDLCALC", "TRIG", "CHOLHDL", "LDLDIRECT" in the last 72 hours. Thyroid  function studies Recent Labs    02/12/24 1619  TSH 1.289   Anemia work up Recent Labs    02/12/24 1619  VITAMINB12 331  FOLATE 10.3    Urinalysis    Component Value Date/Time   COLORURINE YELLOW 02/12/2024 2215   APPEARANCEUR CLEAR 02/12/2024 2215   LABSPEC 1.011 02/12/2024 2215   PHURINE 7.0 02/12/2024 2215   GLUCOSEU NEGATIVE 02/12/2024 2215   HGBUR NEGATIVE 02/12/2024 2215   BILIRUBINUR NEGATIVE 02/12/2024 2215   KETONESUR NEGATIVE 02/12/2024 2215   PROTEINUR NEGATIVE 02/12/2024 2215   NITRITE NEGATIVE 02/12/2024 2215   LEUKOCYTESUR NEGATIVE 02/12/2024 2215   Sepsis Labs Recent Labs  Lab 02/12/24 1413  WBC 8.8   Microbiology Recent Results (from the past 240 hours)  Resp panel by RT-PCR (RSV, Flu A&B, Covid) Anterior Nasal Swab  Status: None   Collection Time: 02/12/24  8:45 PM   Specimen: Anterior Nasal Swab  Result Value Ref Range Status   SARS Coronavirus 2 by RT PCR NEGATIVE NEGATIVE Final    Comment: (NOTE) SARS-CoV-2 target nucleic acids are NOT DETECTED.  The SARS-CoV-2 RNA is generally detectable in upper respiratory specimens during the acute phase of infection. The lowest concentration of SARS-CoV-2 viral copies this assay can detect is 138 copies/mL. A negative result does not preclude SARS-Cov-2 infection and should not be used as the sole basis for treatment or other patient management decisions. A negative result may occur with  improper specimen collection/handling, submission of specimen other than nasopharyngeal swab, presence of viral mutation(s) within the areas targeted by this assay, and inadequate number of viral copies(<138 copies/mL). A negative result must be combined with clinical observations, patient history, and epidemiological information. The expected result is Negative.  Fact Sheet for Patients:  BloggerCourse.com  Fact Sheet for Healthcare Providers:  SeriousBroker.it  This test is no t yet approved or cleared by the [place]  FDA and  has been authorized for detection and/or diagnosis of SARS-CoV-2  by FDA under an Emergency Use Authorization (EUA). This EUA will remain  in effect (meaning this test can be used) for the duration of the COVID-19 declaration under Section 564(b)(1) of the Act, 21 U.S.C.section 360bbb-3(b)(1), unless the authorization is terminated  or revoked sooner.       Influenza A by PCR NEGATIVE NEGATIVE Final   Influenza B by PCR NEGATIVE NEGATIVE Final    Comment: (NOTE) The Xpert Xpress SARS-CoV-2/FLU/RSV plus assay is intended as an aid in the diagnosis of influenza from Nasopharyngeal swab specimens and should not be used as a sole basis for treatment. Nasal washings and aspirates are unacceptable for Xpert Xpress SARS-CoV-2/FLU/RSV testing.  Fact Sheet for Patients: BloggerCourse.com  Fact Sheet for Healthcare Providers: SeriousBroker.it  This test is not yet approved or cleared by the [place]  FDA and has been authorized for detection and/or diagnosis of SARS-CoV-2 by FDA under an Emergency Use Authorization (EUA). This EUA will remain in effect (meaning this test can be used) for the duration of the COVID-19 declaration under Section 564(b)(1) of the Act, 21 U.S.C. section 360bbb-3(b)(1), unless the authorization is terminated or revoked.     Resp Syncytial Virus by PCR NEGATIVE NEGATIVE Final    Comment: (NOTE) Fact Sheet for Patients: BloggerCourse.com  Fact Sheet for Healthcare Providers: SeriousBroker.it  This test is not yet approved or cleared by the [place]  FDA and has been authorized for detection and/or diagnosis of SARS-CoV-2 by FDA under an Emergency Use Authorization (EUA). This EUA will remain in effect (meaning this test can be used) for the duration of the COVID-19 declaration under Section 564(b)(1) of the Act, 21 U.S.C. section 360bbb-3(b)(1), unless the authorization is terminated or revoked.  Performed at  [facility], [address], [place], [place] [address]    Time coordinating discharge: 38 mins   SIGNED:  Faustino Hook, MD  [facility] 02/14/2024, 10:55 AM How to contact the [facility] Attending or Consulting provider 7A - 7P or covering provider during after hours 7P -7A, for this patient?  Check the care team in [facility] and look for a) attending/consulting [facility] provider listed and b) the [facility] team listed Log into [email or web address] and use 's universal password to access. If you do not have the password, please contact the hospital operator. Locate the [facility] provider you are looking for under [facility] and page to a number that you can be directly reached. If you still have difficulty reaching the provider, please page the [facility] (Director on Call) for the Hospitalists listed on amion for assistance.

## 2024-02-14 NOTE — Plan of Care (Signed)

## 2024-02-14 NOTE — Progress Notes (Signed)
 Patient  cursing at staff, redirectable stating he " had not received his paperwork " this writer had given his paperwork and removed his IV per his request at 1135. Patient continues to yell and curse at volunteers and staff. I attempted to call his wife and the voicemail box is full. Patient can't safely go home alone. Will try wife later or patient can be picked up by wife

## 2024-02-14 NOTE — Plan of Care (Signed)
°  Problem: Education: °Goal: Knowledge of General Education information will improve °Description: Including pain rating scale, medication(s)/side effects and non-pharmacologic comfort measures °Outcome: Adequate for Discharge °  °Problem: Clinical Measurements: °Goal: Ability to maintain clinical measurements within normal limits will improve °Outcome: Adequate for Discharge °Goal: Will remain free from infection °Outcome: Adequate for Discharge °Goal: Diagnostic test results will improve °Outcome: Adequate for Discharge °Goal: Respiratory complications will improve °Outcome: Adequate for Discharge °Goal: Cardiovascular complication will be avoided °Outcome: Adequate for Discharge °  °

## 2024-02-15 ENCOUNTER — Telehealth: Payer: Self-pay

## 2024-02-15 DIAGNOSIS — I10 Essential (primary) hypertension: Secondary | ICD-10-CM

## 2024-02-15 LAB — VITAMIN B1: Vitamin B1 (Thiamine): 83.5 nmol/L (ref 66.5–200.0)

## 2024-02-19 DIAGNOSIS — R42 Dizziness and giddiness: Secondary | ICD-10-CM | POA: Diagnosis not present

## 2024-02-19 DIAGNOSIS — E871 Hypo-osmolality and hyponatremia: Secondary | ICD-10-CM | POA: Diagnosis not present

## 2024-02-20 ENCOUNTER — Telehealth: Payer: Self-pay | Admitting: *Deleted

## 2024-02-20 NOTE — Progress Notes (Signed)
 Complex Care Management Note Care Guide Note  02/20/2024 Name: Kenneth Lester MRN: [id number] DOB: 11-28-1955  Kenneth Lester is a 68 y.o. year old male who is a primary care patient of [name], MD . The community resource team was consulted for assistance with Transportation Needs   SDOH screenings and interventions completed:  Yes     SDOH Interventions Today    Flowsheet Row Most Recent Value  SDOH Interventions   Transportation Interventions Community Resources Provided  [phone] TO SPEAK WITH A TRANSPORTATION at [facility] [place]]        Care guide performed the following interventions: Patient provided with information about care guide support team and interviewed to confirm resource needs.  Follow Up Plan:  No further follow up planned at this time. The patient has been provided with needed resources.  Encounter Outcome:  Patient Visit Completed  Kenneth Lester  [name]  Direct Dial:[phone] Fax:[phone] Website: [email or web address]

## 2024-02-28 ENCOUNTER — Other Ambulatory Visit: Payer: Self-pay

## 2024-02-28 ENCOUNTER — Inpatient Hospital Stay (HOSPITAL_COMMUNITY)
Admission: EM | Admit: 2024-02-28 | Discharge: 2024-03-03 | DRG: 644 | Disposition: A | Discharge status: Skilled Nursing Facility | Attending: Internal Medicine | Admitting: Internal Medicine

## 2024-02-28 ENCOUNTER — Encounter (HOSPITAL_COMMUNITY): Payer: Self-pay | Admitting: Emergency Medicine

## 2024-02-28 ENCOUNTER — Emergency Department (HOSPITAL_COMMUNITY)

## 2024-02-28 ENCOUNTER — Observation Stay (HOSPITAL_COMMUNITY)

## 2024-02-28 DIAGNOSIS — K5900 Constipation, unspecified: Secondary | ICD-10-CM | POA: Diagnosis present

## 2024-02-28 DIAGNOSIS — I10 Essential (primary) hypertension: Secondary | ICD-10-CM | POA: Diagnosis present

## 2024-02-28 DIAGNOSIS — E876 Hypokalemia: Secondary | ICD-10-CM | POA: Diagnosis not present

## 2024-02-28 DIAGNOSIS — R630 Anorexia: Secondary | ICD-10-CM | POA: Diagnosis present

## 2024-02-28 DIAGNOSIS — E871 Hypo-osmolality and hyponatremia: Principal | ICD-10-CM | POA: Diagnosis present

## 2024-02-28 DIAGNOSIS — Z681 Body mass index (BMI) 19 or less, adult: Secondary | ICD-10-CM

## 2024-02-28 DIAGNOSIS — Z5941 Food insecurity: Secondary | ICD-10-CM

## 2024-02-28 DIAGNOSIS — R531 Weakness: Secondary | ICD-10-CM

## 2024-02-28 DIAGNOSIS — Z1152 Encounter for screening for COVID-19: Secondary | ICD-10-CM

## 2024-02-28 DIAGNOSIS — M79674 Pain in right toe(s): Secondary | ICD-10-CM | POA: Diagnosis present

## 2024-02-28 DIAGNOSIS — Z79899 Other long term (current) drug therapy: Secondary | ICD-10-CM

## 2024-02-28 DIAGNOSIS — I959 Hypotension, unspecified: Secondary | ICD-10-CM | POA: Diagnosis not present

## 2024-02-28 DIAGNOSIS — F17210 Nicotine dependence, cigarettes, uncomplicated: Secondary | ICD-10-CM | POA: Diagnosis present

## 2024-02-28 DIAGNOSIS — E222 Syndrome of inappropriate secretion of antidiuretic hormone: Principal | ICD-10-CM | POA: Diagnosis present

## 2024-02-28 LAB — CBC
HCT: 39.4 % (ref 39.0–52.0)
Hemoglobin: 14 g/dL (ref 13.0–17.0)
MCH: 31 pg (ref 26.0–34.0)
MCHC: 35.5 g/dL (ref 30.0–36.0)
MCV: 87.4 fL (ref 80.0–100.0)
Platelets: 284 10*3/uL (ref 150–400)
RBC: 4.51 MIL/uL (ref 4.22–5.81)
RDW: 14.2 % (ref 11.5–15.5)
WBC: 8.5 10*3/uL (ref 4.0–10.5)
nRBC: 0 % (ref 0.0–0.2)

## 2024-02-28 LAB — URINALYSIS, ROUTINE W REFLEX MICROSCOPIC
Bilirubin Urine: NEGATIVE
Glucose, UA: NEGATIVE mg/dL
Hgb urine dipstick: NEGATIVE
Ketones, ur: NEGATIVE mg/dL
Leukocytes,Ua: NEGATIVE
Nitrite: NEGATIVE
Protein, ur: NEGATIVE mg/dL
Specific Gravity, Urine: 1.004 — ABNORMAL LOW (ref 1.005–1.030)
pH: 7 (ref 5.0–8.0)

## 2024-02-28 LAB — COMPREHENSIVE METABOLIC PANEL WITH GFR
ALT: 17 U/L (ref 0–44)
AST: 16 U/L (ref 15–41)
Albumin: 4.1 g/dL (ref 3.5–5.0)
Alkaline Phosphatase: 52 U/L (ref 38–126)
Anion gap: 7 (ref 5–15)
BUN: 12 mg/dL (ref 8–23)
CO2: 23 mmol/L (ref 22–32)
Calcium: 9.3 mg/dL (ref 8.9–10.3)
Chloride: 92 mmol/L — ABNORMAL LOW (ref 98–111)
Creatinine, Ser: 0.55 mg/dL — ABNORMAL LOW (ref 0.61–1.24)
GFR, Estimated: >60 mL/min (ref >60)
Glucose, Bld: 101 mg/dL — ABNORMAL HIGH (ref 70–99)
Potassium: 3.8 mmol/L (ref 3.5–5.1)
Sodium: 122 mmol/L — ABNORMAL LOW (ref 135–145)
Total Bilirubin: 0.7 mg/dL (ref 0.0–1.2)
Total Protein: 7.1 g/dL (ref 6.5–8.1)

## 2024-02-28 LAB — SODIUM: Sodium: 127 mmol/L — ABNORMAL LOW (ref 135–145)

## 2024-02-28 LAB — RAPID URINE DRUG SCREEN, HOSP PERFORMED
Amphetamines: NOT DETECTED
Barbiturates: NOT DETECTED
Benzodiazepines: NOT DETECTED
Cocaine: NOT DETECTED
Opiates: NOT DETECTED
Tetrahydrocannabinol: NOT DETECTED

## 2024-02-28 LAB — LIPASE, BLOOD: Lipase: 40 U/L (ref 11–51)

## 2024-02-28 LAB — SODIUM, URINE, RANDOM: Sodium, Ur: 34 mmol/L

## 2024-02-28 MED ORDER — DOCUSATE SODIUM 100 MG PO CAPS
100.0000 mg | ORAL_CAPSULE | Freq: Two times a day (BID) | ORAL | Status: DC
  Administered 2024-02-28 – 2024-03-03 (×5): 100 mg via ORAL
  Filled 2024-02-28 (×6): qty 1

## 2024-02-28 MED ORDER — BISACODYL 10 MG RE SUPP
10.0000 mg | Freq: Every day | RECTAL | Status: AC
  Administered 2024-02-28: 10 mg via RECTAL
  Filled 2024-02-28 (×2): qty 1

## 2024-02-28 MED ORDER — ENOXAPARIN SODIUM 40 MG/0.4ML IJ SOSY
40.0000 mg | PREFILLED_SYRINGE | INTRAMUSCULAR | Status: DC
  Administered 2024-02-29 – 2024-03-01 (×2): 40 mg via SUBCUTANEOUS
  Filled 2024-02-28 (×4): qty 0.4

## 2024-02-28 MED ORDER — LACTATED RINGERS IV BOLUS
1000.0000 mL | Freq: Once | INTRAVENOUS | Status: AC
  Administered 2024-02-28: 1000 mL via INTRAVENOUS

## 2024-02-28 MED ORDER — BISACODYL 10 MG RE SUPP: 10.0000 mg | Freq: Every day | RECTAL | Status: DC | PRN

## 2024-02-28 MED ORDER — AMLODIPINE BESYLATE 5 MG PO TABS
5.0000 mg | ORAL_TABLET | Freq: Every day | ORAL | Status: DC
  Administered 2024-02-29 – 2024-03-03 (×3): 5 mg via ORAL
  Filled 2024-02-28 (×4): qty 1

## 2024-02-28 MED ORDER — MAGNESIUM HYDROXIDE 400 MG/5ML PO SUSP
30.0000 mL | Freq: Every day | ORAL | Status: AC
  Administered 2024-02-28 – 2024-02-29 (×2): 30 mL via ORAL
  Filled 2024-02-28 (×2): qty 30

## 2024-02-28 MED ORDER — NICOTINE 21 MG/24HR TD PT24
21.0000 mg | MEDICATED_PATCH | Freq: Every day | TRANSDERMAL | Status: DC
  Filled 2024-02-28 (×2): qty 1

## 2024-02-28 MED ORDER — LACTULOSE 10 GM/15ML PO SOLN
30.0000 g | Freq: Three times a day (TID) | ORAL | Status: AC
  Administered 2024-02-28: 30 g via ORAL
  Filled 2024-02-28 (×2): qty 60

## 2024-02-28 MED ORDER — LOSARTAN POTASSIUM 25 MG PO TABS
25.0000 mg | ORAL_TABLET | Freq: Every day | ORAL | Status: DC
  Administered 2024-02-29 – 2024-03-01 (×2): 25 mg via ORAL
  Filled 2024-02-28 (×2): qty 1

## 2024-02-28 MED ORDER — SMOG ENEMA
960.0000 mL | Freq: Once | RECTAL | Status: DC
  Filled 2024-02-28: qty 960

## 2024-02-28 MED ORDER — ACETAMINOPHEN 325 MG PO TABS
650.0000 mg | ORAL_TABLET | Freq: Four times a day (QID) | ORAL | Status: DC | PRN
  Administered 2024-02-28: 650 mg via ORAL
  Filled 2024-02-28: qty 2

## 2024-02-28 MED ORDER — SODIUM CHLORIDE 0.9 % IV SOLN: INTRAVENOUS | Status: DC

## 2024-02-28 MED ORDER — ACETAMINOPHEN 650 MG RE SUPP: 650.0000 mg | Freq: Four times a day (QID) | RECTAL | Status: DC | PRN

## 2024-02-28 NOTE — ED Notes (Signed)
 Pt assisted to the bathroom with NT and RN for bowel movement.

## 2024-02-28 NOTE — ED Notes (Signed)
 Pt assisted to bathroom

## 2024-02-28 NOTE — ED Triage Notes (Signed)
 Pt c/o of dizziness, weakness and havent had a BM in 2 days

## 2024-02-28 NOTE — H&P (Signed)
 [facility] H&P   Patient Demographics:    Kenneth Lester, is a 68 y.o. male  MRN: [id number]   DOB - 01-04-1956  Admit Date - 02/28/2024  Outpatient Primary MD for the patient is [name], MD  Referring MD/NP/PA: Dr. Harless Lien  Patient coming from: Home  No chief complaint on file.     HPI:    Kenneth Lester  is a 67 y.o. male, with past medical history of alcohol abuse, tobacco abuse, hypertension, recent admission secondary to hyponatremia thought to be secondary to poor oral intake, resolved at time of discharge, workup then significant for normal TSH and cortisol level, patient reports generalized weakness, fatigue, poor appetite over last couple days, dizziness as well so he presents to ED. - In ED workup significant for sodium of 122, no hypotension, no hypoxia, CT abdomen pelvis significant for diverticulosis and constipation, [facility] hospitalist consulted to admit    Review of systems:     A full 10 point Review of Systems was done, except as stated above, all other Review of Systems were negative.   With Past History of the following :    Past Medical History:  Diagnosis Date   Alcohol abuse    Hypertension    Use of cane as ambulatory aid    Vertigo       Past Surgical History:  Procedure Laterality Date   COLONOSCOPY  2009   Dr. Riley Cheadle: difficult prep (patient ate solid food during prep). single hyperplastic polyps removed.   MOUTH SURGERY     teeth pulled for dentures, pt unsure of date   ORIF WRIST FRACTURE Left 09/22/2021   Procedure: OPEN REDUCTION INTERNAL FIXATION (ORIF) WRIST FRACTURE;  Surgeon: Ltanya Rummer, MD;  Location: [facility];  Service: Orthopedics;  Laterality: Left;  with MAC   right ankle surgery  2006   forklift injury      Social History:     Social History   Tobacco Use   Smoking status: Every Day    Current packs/day:  1.00    Average packs/day: 1 pack/day for 50.0 years (50.0 ttl pk-yrs)    Types: Cigarettes    Passive exposure: Never   Smokeless tobacco: Never  Substance Use Topics   Alcohol use: Not Currently    Alcohol/week: 7.0 standard drinks of alcohol    Types: 7 Cans of beer per week    Comment: denies use       Family History :     Family History  Problem Relation Age of Onset   Colon cancer Neg Hx      Home Medications:   Prior to Admission medications   Medication Sig Start Date End Date Taking? Authorizing Provider  amLODipine  (NORVASC ) 5 MG tablet Take 1 tablet (5 mg total) by mouth daily. 07/19/23  Yes Johnson, Clanford L, MD  lactulose , encephalopathy, (CHRONULAC ) 10 GM/15ML SOLN Take  10 g by mouth daily as needed (constipation). 12/11/23  Yes [provider]  losartan  (COZAAR ) 25 MG tablet Take 1 tablet (25 mg total) by mouth daily. 07/19/23  Yes Johnson, Clanford L, MD  meclizine  (ANTIVERT ) 12.5 MG tablet Take 1 tablet (12.5 mg total) by mouth 3 (three) times daily as needed for dizziness. 02/14/24  Yes Rayfield Cairo, MD     Allergies:     Allergies  Allergen Reactions   Other Other (See Comments)    Patient states he was allergic to something he was given in an IV at the hospital when being treated for pneumonia, but he doesn't remember what the name of the medication was. 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     Physical Exam:   Vitals  Blood pressure (!) 143/88, pulse 64, temperature 97.8 F (36.6 C), temperature source Oral, resp. rate 18, SpO2 97%.   1. General Frail elderly male, laying in bed, no apparent distress*  2. Normal affect and insight, Not Suicidal or Homicidal, Awake Alert, Oriented X 3.  3. No F.N deficits, ALL C.Nerves Intact, Strength 5/5 all 4 extremities, Sensation intact all 4 extremities, Plantars down going.  4. Ears and Eyes appear Normal, Conjunctivae clear, PERRLA. Moist Oral Mucosa.  5. Supple Neck, No JVD, No  cervical lymphadenopathy appriciated, No Carotid Bruits.  6. Symmetrical Chest wall movement, Good air movement bilaterally, CTAB.  7. RRR, No Gallops, Rubs or Murmurs, No Parasternal Heave.  8. Positive Bowel Sounds, Abdomen Soft, No tenderness, No organomegaly appriciated,No rebound -guarding or rigidity.  9.  No Cyanosis, Normal Skin Turgor, No Skin Rash or Bruise.  10. Good muscle tone,  joints appear normal , no effusions, Normal ROM.    Data Review:    CBC Recent Labs  Lab 02/28/24 1717  WBC 8.5  HGB 14.0  HCT 39.4  PLT 284  MCV 87.4  MCH 31.0  MCHC 35.5  RDW 14.2   ------------------------------------------------------------------------------------------------------------------  Chemistries  Recent Labs  Lab 02/28/24 1717 02/28/24 1935  NA 122* 127*  K 3.8  --   CL 92*  --   CO2 23  --   GLUCOSE 101*  --   BUN 12  --   CREATININE 0.55*  --   CALCIUM 9.3  --   AST 16  --   ALT 17  --   ALKPHOS 52  --   BILITOT 0.7  --    ------------------------------------------------------------------------------------------------------------------ CrCl cannot be calculated (Unknown ideal weight.). ------------------------------------------------------------------------------------------------------------------ No results for input(s): "TSH", "T4TOTAL", "T3FREE", "THYROIDAB" in the last 72 hours.  Invalid input(s): "FREET3"  Coagulation profile No results for input(s): "INR", "PROTIME" in the last 168 hours. ------------------------------------------------------------------------------------------------------------------- No results for input(s): "DDIMER" in the last 72 hours. -------------------------------------------------------------------------------------------------------------------  Cardiac Enzymes No results for input(s): "CKMB", "TROPONINI", "MYOGLOBIN" in the last 168 hours.  Invalid input(s):  "CK" ------------------------------------------------------------------------------------------------------------------ No results found for: "BNP"   ---------------------------------------------------------------------------------------------------------------  Urinalysis    Component Value Date/Time   COLORURINE STRAW (A) 02/28/2024 1927   APPEARANCEUR CLEAR 02/28/2024 1927   LABSPEC 1.004 (L) 02/28/2024 1927   PHURINE 7.0 02/28/2024 1927   GLUCOSEU NEGATIVE 02/28/2024 1927   HGBUR NEGATIVE 02/28/2024 1927   BILIRUBINUR NEGATIVE 02/28/2024 1927   KETONESUR NEGATIVE 02/28/2024 1927   PROTEINUR NEGATIVE 02/28/2024 1927   NITRITE NEGATIVE 02/28/2024 1927   LEUKOCYTESUR NEGATIVE 02/28/2024 1927    ----------------------------------------------------------------------------------------------------------------   Imaging Results:    CT ABDOMEN PELVIS WO CONTRAST Result Date: 02/28/2024 CLINICAL  DATA:  Acute generalized abdominal pain. EXAM: CT ABDOMEN AND PELVIS WITHOUT CONTRAST TECHNIQUE: Multidetector CT imaging of the abdomen and pelvis was performed following the standard protocol without IV contrast. RADIATION DOSE REDUCTION: This exam was performed according to the departmental dose-optimization program which includes automated exposure control, adjustment of the mA and/or kV according to patient size and/or use of iterative reconstruction technique. COMPARISON:  July 13, 2023. FINDINGS: Lower chest: No acute abnormality. Hepatobiliary: No focal liver abnormality is seen. No gallstones, gallbladder wall thickening, or biliary dilatation. Pancreas: Unremarkable. No pancreatic ductal dilatation or surrounding inflammatory changes. Spleen: Normal in size without focal abnormality. Adrenals/Urinary Tract: Adrenal glands are unremarkable. Kidneys are normal, without renal calculi, focal lesion, or hydronephrosis. Bladder is unremarkable. Stomach/Bowel: The stomach is unremarkable. Small  bowel is unremarkable. The appendix is not visualized. Large amount of stool seen throughout the colon and rectum concerning for constipation. Vascular/Lymphatic: Aortic atherosclerosis. No enlarged abdominal or pelvic lymph nodes. Reproductive: Prostate is unremarkable. Other: No ascites or hernia is noted. Musculoskeletal: No acute or significant osseous findings. IMPRESSION: Large amount of stool seen throughout the colon and rectum concerning for constipation. Aortic Atherosclerosis (ICD10-I70.0). Electronically Signed   By: Rosalene Colon M.D.   On: 02/28/2024 18:16     Assessment & Plan:    Principal Problem:   Hyponatremia Active Problems:   Essential hypertension   Generalized weakness    Recurrent hyponatremia - Patient with history of recurrent hyponatremia in the past, felt secondary to poor oral intake and decreased solute intake. - He reports poor appetite, over the last few days, but like related to constipation. 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 - Received 1 L in ED, sodium up from 122-127, will avoid rapid correction, will continue with gentle IV fluid resuscitation at 50 cc/h  Constipation - Started on bowel regimen  Hypertension - Pressure acceptable, continue with home medications  Generalized weakness - Will consult PT  Tobacco abuse - Counseled, will start on nicotine  patch  DVT Prophylaxis Heparin  -  Lovenox   AM Labs Ordered, also please review Full Orders  Family Communication: Admission, patients condition and plan of care including tests being ordered have been discussed with the patient  who indicate understanding and agree with the plan and Code Status.  Code Status full code  Likely DC to home   Consults called: None  Admission status: Observation  Time spent in minutes : 55 minutes   Seena Dadds M.D on 02/28/2024 at  9:40 PM   [facility] - Office  [phone]

## 2024-02-28 NOTE — ED Notes (Signed)
 Pt had successful bowel movement. Reports he is feeling much better after the suppository and BM.

## 2024-02-28 NOTE — ED Provider Notes (Signed)
 [facility]-EMERGENCY DEPT [facility] Emergency Department Provider Note MRN:  [id number]  Arrival date & time: 02/28/24     Chief Complaint   Constipation History of Present Illness   Kenneth Lester is a 68 y.o. year-old male with a history of hypertension presenting to the ED with chief complaint of constipation.  No bowel movement for the past 2 to 3 days causing bloating and discomfort.  Also continues to feel dizzy, was in the hospital for this same dizziness that has not really gotten better.  Denies fever, no nausea vomiting.  Review of Systems  A thorough review of systems was obtained and all systems are negative except as noted in the HPI and PMH.   Patient's Health History    Past Medical History:  Diagnosis Date   Alcohol abuse    Hypertension    Use of cane as ambulatory aid    Vertigo     Past Surgical History:  Procedure Laterality Date   COLONOSCOPY  2009   Dr. Riley Cheadle: difficult prep (patient ate solid food during prep). single hyperplastic polyps removed.   MOUTH SURGERY     teeth pulled for dentures, pt unsure of date   ORIF WRIST FRACTURE Left 09/22/2021   Procedure: OPEN REDUCTION INTERNAL FIXATION (ORIF) WRIST FRACTURE;  Surgeon: Ltanya Rummer, MD;  Location: [facility] [place];  Service: Orthopedics;  Laterality: Left;  with MAC   right ankle surgery  2006   forklift injury    Family History  Problem Relation Age of Onset   Colon cancer Neg Hx     Social History   Socioeconomic History   Marital status: Married    Spouse name: Not on file   Number of children: Not on file   Years of education: Not on file   Highest education level: Not on file  Occupational History   Not on file  Tobacco Use   Smoking status: Every Day    Current packs/day: 1.00    Average packs/day: 1 pack/day for 50.0 years (50.0 ttl pk-yrs)    Types: Cigarettes    Passive exposure: Never   Smokeless tobacco: Never  Vaping Use   Vaping status: Never Used   Substance and Sexual Activity   Alcohol use: Not Currently    Alcohol/week: 7.0 standard drinks of alcohol    Types: 7 Cans of beer per week    Comment: denies use   Drug use: No   Sexual activity: Not on file  Other Topics Concern   Not on file  Social History Narrative   Not on file   Social Drivers of Health   Financial Resource Strain: Not on file  Food Insecurity: Food Insecurity Present (08/23/2023)   Hunger Vital Sign    Worried About Running Out of Food in the Last Year: Sometimes true    Ran Out of Food in the Last Year: Sometimes true  Transportation Needs: Unmet Transportation Needs (08/23/2023)   PRAPARE - Administrator, Civil Service (Medical): Yes    Lack of Transportation (Non-Medical): Yes  Physical Activity: Not on file  Stress: Not on file  Social Connections: Not on file  Intimate Partner Violence: Not At Risk (08/23/2023)   Humiliation, Afraid, Rape, and Kick questionnaire    Fear of Current or Ex-Partner: No    Emotionally Abused: No    Physically Abused: No    Sexually Abused: No     Physical Exam   Vitals:   02/28/24 1845 02/28/24  1900  BP: (!) 149/91 (!) 143/88  Pulse: 71 64  Resp:  18  Temp:    SpO2: 98% 97%    CONSTITUTIONAL: Well-appearing, NAD NEURO/PSYCH:  Alert and oriented x 3, normal and symmetric strength and sensation, normal coordination, normal speech EYES:  eyes equal and reactive ENT/NECK:  no LAD, no JVD CARDIO: Regular rate, well-perfused, normal S1 and S2 PULM:  CTAB no wheezing or rhonchi GI/GU:  non-distended, non-tender MSK/SPINE:  No gross deformities, no edema SKIN:  no rash, atraumatic   *Additional and/or pertinent findings included in MDM below  Diagnostic and Interventional Summary    EKG Interpretation Date/Time:  Thursday Feb 28 2024 16:58:16 EDT Ventricular Rate:  69 PR Interval:  155 QRS Duration:  81 QT Interval:  376 QTC Calculation: 403 R Axis:   75  Text Interpretation: Sinus  rhythm Probable anteroseptal infarct, old Minimal ST elevation, inferior leads Confirmed by Gwenetta Lennert [phone]) on 02/28/2024 5:05:38 PM       Labs Reviewed  COMPREHENSIVE METABOLIC PANEL WITH GFR - Abnormal; Notable for the following components:      Result Value   Sodium 122 (*)    Chloride 92 (*)    Glucose, Bld 101 (*)    Creatinine, Ser 0.55 (*)    All other components within normal limits  SODIUM - Abnormal; Notable for the following components:   Sodium 127 (*)    All other components within normal limits  URINALYSIS, ROUTINE W REFLEX MICROSCOPIC - Abnormal; Notable for the following components:   Color, Urine STRAW (*)    Specific Gravity, Urine 1.004 (*)    All other components within normal limits  CBC  LIPASE, BLOOD  SODIUM, URINE, RANDOM  RAPID URINE DRUG SCREEN, HOSP PERFORMED  OSMOLALITY  BASIC METABOLIC PANEL WITH GFR  CBC    CT CHEST WO CONTRAST  Final Result    CT ABDOMEN PELVIS WO CONTRAST  Final Result      Medications  bisacodyl  (DULCOLAX) suppository 10 mg (10 mg Rectal Given 02/28/24 1934)  sorbitol, magnesium hydroxide, mineral oil, glycerin (SMOG) enema (has no administration in time range)  lactulose  (CHRONULAC ) 10 GM/15ML solution 30 g (has no administration in time range)  enoxaparin  (LOVENOX ) injection 40 mg (has no administration in time range)  acetaminophen  (TYLENOL ) tablet 650 mg (has no administration in time range)    Or  acetaminophen  (TYLENOL ) suppository 650 mg (has no administration in time range)  docusate sodium (COLACE) capsule 100 mg (has no administration in time range)  magnesium hydroxide (MILK OF MAGNESIA) suspension 30 mL (has no administration in time range)  nicotine  (NICODERM CQ  - dosed in mg/24 hours) patch 21 mg (has no administration in time range)  bisacodyl  (DULCOLAX) suppository 10 mg (has no administration in time range)  0.9 %  sodium chloride  infusion (has no administration in time range)  losartan  (COZAAR )  tablet 25 mg (has no administration in time range)  amLODipine  (NORVASC ) tablet 5 mg (has no administration in time range)  lactated ringers  bolus 1,000 mL (0 mLs Intravenous Stopped 02/28/24 1930)     Procedures  /  Critical Care .Critical Care  Performed by: Edson Graces, MD Authorized by: Edson Graces, MD   Critical care provider statement:    Critical care time (minutes):  30   Critical care was necessary to treat or prevent imminent or life-threatening deterioration of the following conditions:  Metabolic crisis (Critical hyponatremia)   Critical care was time spent personally by  me on the following activities:  Development of treatment plan with patient or surrogate, discussions with consultants, evaluation of patient's response to treatment, examination of patient, ordering and review of laboratory studies, ordering and review of radiographic studies, ordering and performing treatments and interventions, pulse oximetry, re-evaluation of patient's condition and review of old charts   ED Course and Medical Decision Making  Initial Impression and Ddx Patient here with no bowel movements for the past few days, recent hospitalization for vertigo, had a very thorough evaluation for the dizziness with reassuring MRI.  Dizziness has not changed, no focal neurological deficits on exam, no indication for repeat CNS imaging.  SBO is considered.  Patient also had hyponatremia during his admission, will recheck labs, obtain CT abdomen.  Past medical/surgical history that increases complexity of ED encounter: None  Interpretation of Diagnostics I personally reviewed the EKG and my interpretation is as follows: Sinus rhythm  Labs reveal critical hyponatremia.  CT with constipation but no obstruction.  Patient Reassessment and Ultimate Disposition/Management     Plan is for admission.  Patient management required discussion with the following services or consulting groups:  Hospitalist  Service  Complexity of Problems Addressed Acute illness or injury that poses threat of life of bodily function  Additional Data Reviewed and Analyzed Further history obtained from: Prior labs/imaging results  Additional Factors Impacting ED Encounter Risk Consideration of hospitalization  Merrick Abe. Harless Lien, MD [facility] Emergency Medicine [facility] [email or web address]  Final Clinical Impressions(s) / ED Diagnoses     ICD-10-CM   1. Hyponatremia  E87.1     2. Constipation, unspecified constipation type  K59.00       ED Discharge Orders     None        Discharge Instructions Discussed with and Provided to Patient:   Discharge Instructions   None      Edson Graces, MD 02/28/24 2249

## 2024-02-28 NOTE — ED Notes (Signed)
 MD at bedside.

## 2024-02-29 DIAGNOSIS — E871 Hypo-osmolality and hyponatremia: Secondary | ICD-10-CM | POA: Diagnosis not present

## 2024-02-29 LAB — BASIC METABOLIC PANEL WITH GFR
Anion gap: 6 (ref 5–15)
BUN: 10 mg/dL (ref 8–23)
CO2: 24 mmol/L (ref 22–32)
Calcium: 8.8 mg/dL — ABNORMAL LOW (ref 8.9–10.3)
Chloride: 100 mmol/L (ref 98–111)
Creatinine, Ser: 0.47 mg/dL — ABNORMAL LOW (ref 0.61–1.24)
GFR, Estimated: >60 mL/min (ref >60)
Glucose, Bld: 81 mg/dL (ref 70–99)
Potassium: 3.4 mmol/L — ABNORMAL LOW (ref 3.5–5.1)
Sodium: 130 mmol/L — ABNORMAL LOW (ref 135–145)

## 2024-02-29 LAB — CBC
HCT: 37.1 % — ABNORMAL LOW (ref 39.0–52.0)
Hemoglobin: 13.2 g/dL (ref 13.0–17.0)
MCH: 30.8 pg (ref 26.0–34.0)
MCHC: 35.6 g/dL (ref 30.0–36.0)
MCV: 86.7 fL (ref 80.0–100.0)
Platelets: 286 10*3/uL (ref 150–400)
RBC: 4.28 MIL/uL (ref 4.22–5.81)
RDW: 14 % (ref 11.5–15.5)
WBC: 6.7 10*3/uL (ref 4.0–10.5)
nRBC: 0 % (ref 0.0–0.2)

## 2024-02-29 LAB — OSMOLALITY: Osmolality: 271 mosm/kg — ABNORMAL LOW (ref 275–295)

## 2024-02-29 MED ORDER — SODIUM CHLORIDE 0.9 % IV SOLN: INTRAVENOUS | Status: DC

## 2024-02-29 MED ORDER — SODIUM CHLORIDE 0.9 % IV BOLUS
500.0000 mL | Freq: Once | INTRAVENOUS | Status: AC | PRN
  Administered 2024-02-29: 500 mL via INTRAVENOUS

## 2024-02-29 MED ORDER — MECLIZINE HCL 12.5 MG PO TABS
12.5000 mg | ORAL_TABLET | Freq: Three times a day (TID) | ORAL | Status: DC | PRN
  Administered 2024-03-01: 12.5 mg via ORAL
  Filled 2024-02-29: qty 1

## 2024-02-29 MED ORDER — POTASSIUM CHLORIDE CRYS ER 20 MEQ PO TBCR
40.0000 meq | EXTENDED_RELEASE_TABLET | Freq: Once | ORAL | Status: AC
  Administered 2024-02-29: 40 meq via ORAL
  Filled 2024-02-29: qty 2

## 2024-02-29 MED ORDER — SODIUM CHLORIDE 1 G PO TABS
1.0000 g | ORAL_TABLET | Freq: Three times a day (TID) | ORAL | Status: DC
  Administered 2024-02-29 – 2024-03-01 (×3): 1 g via ORAL
  Filled 2024-02-29 (×3): qty 1

## 2024-02-29 NOTE — Evaluation (Signed)
 Physical Therapy Evaluation Patient Details Name: Kenneth Lester MRN: [id number] DOB: 03-Sep-1956 Today's Date: 02/29/2024  History of Present Illness  Juan Dorsainvil  is a 68 y.o. male, with past medical history of alcohol abuse, tobacco abuse, hypertension, recent admission secondary to hyponatremia thought to be secondary to poor oral intake, resolved at time of discharge, workup then significant for normal TSH and cortisol level, patient reports generalized weakness, fatigue, poor appetite over last couple days, dizziness as well so he presents to ED.   Clinical Impression  Patient requires HOB raised for sitting up, demonstrates slow labored movement with frequent rest breaks when taking steps using his cane, has to lean on walls for support and limited mostly due to c/o fatigue and right foot pain. Patient declined sitting up in chair and went back to bed. Patient will benefit from continued skilled physical therapy in hospital and recommended venue below to increase strength, balance, endurance for safe ADLs and gait.         If plan is discharge home, recommend the following: A little help with bathing/dressing/bathroom;Help with stairs or ramp for entrance;Assistance with cooking/housework;A lot of help with walking and/or transfers   Can travel by private vehicle   No    Equipment Recommendations None recommended by PT  Recommendations for Other Services       Functional Status Assessment Patient has had a recent decline in their functional status and demonstrates the ability to make significant improvements in function in a reasonable and predictable amount of time.     Precautions / Restrictions Precautions Precautions: Fall Restrictions Weight Bearing Restrictions Per Provider Order: No      Mobility  Bed Mobility Overal bed mobility: Modified Independent             General bed mobility comments: HOB raised    Transfers Overall transfer level: Needs  assistance Equipment used: Straight cane Transfers: Sit to/from Stand, Bed to chair/wheelchair/BSC Sit to Stand: Contact guard assist, Min assist   Step pivot transfers: Min assist, Contact guard assist       General transfer comment: slow labored movement having to lean on wall while using SPC    Ambulation/Gait Ambulation/Gait assistance: Contact guard assist, Min assist Gait Distance (Feet): 15 Feet Assistive device: Straight cane Gait Pattern/deviations: Decreased step length - left, Decreased stance time - right, Decreased stride length Gait velocity: slow     General Gait Details: frequent leaning on walls with slow labored movement, limited mostly due to c/o fatigue  Stairs            Wheelchair Mobility     Tilt Bed    Modified Rankin (Stroke Patients Only)       Balance Overall balance assessment: Needs assistance Sitting-balance support: Feet supported, No upper extremity supported Sitting balance-Leahy Scale: Fair Sitting balance - Comments: fair/good seated at EOB   Standing balance support: During functional activity, Single extremity supported Standing balance-Leahy Scale: Poor Standing balance comment: fair/poor using SPC                             Pertinent Vitals/Pain Pain Assessment Pain Assessment: Faces Faces Pain Scale: Hurts little more Pain Location: feet and toes, mostly right foot Pain Descriptors / Indicators: Discomfort, Grimacing, Sore Pain Intervention(s): Limited activity within patient's tolerance, Monitored during session, Repositioned    Home Living Family/patient expects to be discharged to:: Private residence Living Arrangements: Spouse/significant other Available Help at Discharge:  Family;Available PRN/intermittently Type of Home: House Home Access: Stairs to enter Entrance Stairs-Rails: Doctor, general practice of Steps: 3-4   Home Layout: One level Home Equipment: Cane - single point;Shower  seat;Grab bars - tub/shower;Rolling Environmental consultant (2 wheels)      Prior Function Prior Level of Function : Needs assist       Physical Assist : Mobility (physical);ADLs (physical) Mobility (physical): Bed mobility;Transfers;Gait;Stairs   Mobility Comments: household and short distanced community ambulator using SPC or RW ADLs Comments: Independent ADL assist IADL by wife     Extremity/Trunk Assessment   Upper Extremity Assessment Upper Extremity Assessment: Overall WFL for tasks assessed    Lower Extremity Assessment Lower Extremity Assessment: Generalized weakness    Cervical / Trunk Assessment Cervical / Trunk Assessment: Normal  Communication   Communication Communication: No apparent difficulties    Cognition Arousal: Alert Behavior During Therapy: WFL for tasks assessed/performed, Anxious   PT - Cognitive impairments: No apparent impairments                         Following commands: Intact       Cueing Cueing Techniques: Verbal cues     General Comments      Exercises     Assessment/Plan    PT Assessment Patient needs continued PT services  PT Problem List Decreased strength;Decreased activity tolerance;Decreased balance;Decreased mobility;Pain       PT Treatment Interventions DME instruction;Gait training;Stair training;Functional mobility training;Therapeutic activities;Therapeutic exercise;Balance training;Patient/family education    PT Goals (Current goals can be found in the Care Plan section)  Acute Rehab PT Goals Patient Stated Goal: return home PT Goal Formulation: With patient Time For Goal Achievement: 03/14/24 Potential to Achieve Goals: Good    Frequency Min 3X/week     Co-evaluation               AM-PAC PT "6 Clicks" Mobility  Outcome Measure Help needed turning from your back to your side while in a flat bed without using bedrails?: None Help needed moving from lying on your back to sitting on the side of a flat  bed without using bedrails?: None Help needed moving to and from a bed to a chair (including a wheelchair)?: A Little Help needed standing up from a chair using your arms (e.g., wheelchair or bedside chair)?: A Little Help needed to walk in hospital room?: A Lot Help needed climbing 3-5 steps with a railing? : A Lot 6 Click Score: 18    End of Session   Activity Tolerance: Patient tolerated treatment well;Patient limited by fatigue Patient left: in bed Nurse Communication: Mobility status PT Visit Diagnosis: Unsteadiness on feet (R26.81);Other abnormalities of gait and mobility (R26.89);Muscle weakness (generalized) (M62.81)    Time: 0865-7846 PT Time Calculation (min) (ACUTE ONLY): 26 min   Charges:   PT Evaluation $PT Eval Moderate Complexity: 1 Mod PT Treatments $Therapeutic Activity: 23-37 mins PT General Charges $$ ACUTE PT VISIT: 1 Visit         3:17 PM, 02/29/24 Walton Guppy, MPT Physical Therapist with [facility] 336 [phone] office [phone] mobile phone

## 2024-02-29 NOTE — Plan of Care (Signed)
°  Problem: Clinical Measurements: Goal: Will remain free from infection Outcome: Progressing   Problem: Activity: Goal: Risk for activity intolerance will decrease Outcome: Progressing   Problem: Nutrition: Goal: Adequate nutrition will be maintained Outcome: Progressing

## 2024-02-29 NOTE — Hospital Course (Addendum)
 68 y.o. male, with past medical history of alcohol abuse, tobacco abuse, hypertension, recent admission secondary to hyponatremia thought to be secondary to poor oral intake, resolved at time of discharge, workup then significant for normal TSH and cortisol level, patient reports generalized weakness, fatigue, poor appetite over last couple days, dizziness as well so he presented to ED. In ED workup significant for sodium of 122, no hypotension, no hypoxia CT abdomen pelvis significant for diverticulosis and constipation, [facility] hospitalist consulted to admit Underwent CT chest that showed mild left basilar atelectasis small sliding hiatal hernia aortic stenosis no acute finding Patient was placed on IV fluid hydration after bolus in the ED  Subjective: Seen and examined Complain of pain in toe nail has long toenail on the right foot Complains of wobbly gait at home lack of transportation and being alone Overnight afebrile BP stable Labs shows sodium improved 127>130.  Potassium 3.4 CBC stable  Assessment and plan:  Recurrent hyponatremia He has history of recurrent hyponatremia in the past, felt secondary to poor oral intake and decreased solute intake and improved with treatment Reports poor appetite poor intake with constipation With IV fluids sodium has improved again RD consulted encourage oral intake, add salt tablets Continue IV fluids, repeat BMP in the morning   Constipation Continue bowel regimen had BM 5/8 , continue stool softeners/MiraLAX    Hypokalemia: Replace potassium.  Hypertension BP stable on home meds   Generalized weakness deconditioning debility PT OT consulted for disposition, reports he is unable to ambulate much at home due to deconditioning, toenail pain from long nail and lack of help at home while wife at work.  Tobacco abuse Counseled, will start on nicotine  patch

## 2024-02-29 NOTE — NC FL2 (Cosign Needed Addendum)
 [place]  MEDICAID FL2 LEVEL OF CARE FORM     IDENTIFICATION  Patient Name: Kenneth Lester Birthdate: 1956/08/18 Sex: male Admission Date (Current Location): 02/28/2024  [facility] and [place] Number:  [employer] and Address:  [facility],  [address] [place] [address]      Provider Number: [id number]  Attending Physician Name and Address:  Lesa Rape, MD  Relative Name and Phone Number:       Current Level of Care: Hospital Recommended Level of Care: Skilled Nursing Facility Prior Approval Number:    Date Approved/Denied:   PASRR Number: [id number]  Discharge Plan: SNF    Current Diagnoses: Patient Active Problem List   Diagnosis Date Noted   Generalized weakness 07/17/2023   Acute hyponatremia 05/01/2023   Tobacco use disorder 05/01/2023   Hypokalemia 05/01/2023   Essential hypertension 05/01/2023   Dizziness 05/01/2023   History of alcohol abuse 05/01/2023   Hyponatremia 05/01/2023   Colon cancer screening 12/31/2018   Clavicle fracture 01/15/2014   Closed fracture of acromial end of clavicle 12/22/2013    Orientation RESPIRATION BLADDER Height & Weight     Self, Time, Situation, Place  Normal Continent Weight: 57.5 kg Height:  5\' 8"  (172.7 cm)  BEHAVIORAL SYMPTOMS/MOOD NEUROLOGICAL BOWEL NUTRITION STATUS      Continent Diet (regular)  AMBULATORY STATUS COMMUNICATION OF NEEDS Skin   Extensive Assist Verbally Normal                       Personal Care Assistance Level of Assistance  Bathing, Feeding, Dressing Bathing Assistance: Limited assistance Feeding assistance: Independent Dressing Assistance: Limited assistance     Functional Limitations Info  Sight, Hearing, Speech Sight Info: Impaired Hearing Info: Adequate Speech Info: Adequate    SPECIAL CARE FACTORS FREQUENCY  PT (By licensed PT), OT (By licensed OT)     PT Frequency: 5 times weekly OT Frequency: 5 times weekly            Contractures  Contractures Info: Not present    Additional Factors Info  Code Status, Allergies Code Status Info: FULL Allergies Info: NKA           Current Medications (02/29/2024):  This is the current hospital active medication list Current Facility-Administered Medications  Medication Dose Route Frequency Provider Last Rate Last Admin   0.9 %  sodium chloride  infusion   Intravenous Continuous Kc, Ramesh, MD 50 mL/hr at 02/29/24 1735 Infusion Verify at 02/29/24 1735   acetaminophen  (TYLENOL ) tablet 650 mg  650 mg Oral Q6H PRN Elgergawy, Dawood S, MD   650 mg at 02/28/24 2348   Or   acetaminophen  (TYLENOL ) suppository 650 mg  650 mg Rectal Q6H PRN Elgergawy, Dawood S, MD       amLODipine  (NORVASC ) tablet 5 mg  5 mg Oral Daily Elgergawy, Dawood S, MD   5 mg at 02/29/24 5621   bisacodyl  (DULCOLAX) suppository 10 mg  10 mg Rectal Daily Elgergawy, Dawood S, MD   10 mg at 02/28/24 1934   bisacodyl  (DULCOLAX) suppository 10 mg  10 mg Rectal Daily PRN Elgergawy, Dawood S, MD       docusate sodium  (COLACE) capsule 100 mg  100 mg Oral BID Elgergawy, Dawood S, MD   100 mg at 02/29/24 0853   enoxaparin  (LOVENOX ) injection 40 mg  40 mg Subcutaneous Q24H Elgergawy, Dawood S, MD   40 mg at 02/29/24 0853   lactulose  (CHRONULAC ) 10 GM/15ML solution 30 g  30 g Oral TID Elgergawy, Dawood S, MD   30 g at 02/28/24 2348   losartan  (COZAAR ) tablet 25 mg  25 mg Oral Daily Elgergawy, Dawood S, MD   25 mg at 02/29/24 2542   meclizine  (ANTIVERT ) tablet 12.5 mg  12.5 mg Oral TID PRN Lesa Rape, MD       nicotine  (NICODERM CQ  - dosed in mg/24 hours) patch 21 mg  21 mg Transdermal Daily Elgergawy, Dawood S, MD       sodium chloride  0.9 % bolus 500 mL  500 mL Intravenous Once PRN Kc, Lurlene Salon, MD       sodium chloride  tablet 1 g  1 g Oral TID WC Kc, Ramesh, MD   1 g at 02/29/24 1654   sorbitol , magnesium  hydroxide, mineral oil, glycerin (SMOG) enema  960 mL Rectal Once Elgergawy, Dawood S, MD         Discharge  Medications: Please see discharge summary for a list of discharge medications.  Relevant Imaging Results:  Relevant Lab Results:   Additional Information SSN: [id number]  Geraldina Klinefelter, RN

## 2024-02-29 NOTE — Plan of Care (Signed)
  Problem: Acute Rehab PT Goals(only PT should resolve) Goal: Pt Will Go Supine/Side To Sit Outcome: Progressing Flowsheets (Taken 02/29/2024 1518) Pt will go Supine/Side to Sit:  Independently  with modified independence Goal: Patient Will Transfer Sit To/From Stand Outcome: Progressing Flowsheets (Taken 02/29/2024 1518) Patient will transfer sit to/from stand: with supervision Goal: Pt Will Transfer Bed To Chair/Chair To Bed Outcome: Progressing Flowsheets (Taken 02/29/2024 1518) Pt will Transfer Bed to Chair/Chair to Bed: with supervision Goal: Pt Will Ambulate Outcome: Progressing Flowsheets (Taken 02/29/2024 1518) Pt will Ambulate:  50 feet  with supervision  with rolling walker  with cane   3:18 PM, 02/29/24 Walton Guppy, MPT Physical Therapist with [facility] 336 [phone] office [phone] mobile phone

## 2024-02-29 NOTE — TOC Initial Note (Signed)
 Transition of Care [facility]) - Initial/Assessment Note    Patient Details  Name: Kenneth Lester MRN: [id number] Date of Birth: 07-29-1956  Transition of [facility] CM/SW Contact:    Geraldina Klinefelter, RN Phone Number: 02/29/2024, 7:00 PM  Clinical Narrative:                 Pt c/hyponatremia, hypokalemia, constipation, weakness. From home c/wife who works outside the home so pt is alone during most daytime hours. Pt c/o weakness, unsteady gait, fear of falling. PT eval completed and recommending SNF. Pt agreeable.   Expected Discharge Plan: Skilled Nursing Facility Barriers to Discharge: Continued Medical Work up, SNF Pending bed offer  Patient Goals and CMS Choice Patient states their goals for this hospitalization and ongoing recovery are:: To walk CMS Medicare.gov Compare Post Acute Care list provided to:: Patient Choice offered to / list presented to : Patient [name] ownership interest in [facility].provided to:: Patient   Expected Discharge Plan and Services In-house Referral: Clinical Social Work Discharge Planning Services: CM Consult Post Acute Care Choice: Skilled Nursing Facility Living arrangements for the past 2 months: Single Family Home                 Prior Living Arrangements/Services Living arrangements for the past 2 months: Single Family Home Lives with:: Spouse Patient language and need for interpreter reviewed:: Yes Do you feel safe going back to the place where you live?: No   r/t weakness, poor balance, fall risk  Need for Family Participation in Patient Care: Yes (Comment) Care giver support system in place?: Yes (comment)   Criminal Activity/Legal Involvement Pertinent to Current Situation/Hospitalization: No - Comment as needed  Activities of Daily Living   Permission Sought/Granted Permission sought to share information with : Case Manager, Family Supports, Magazine features editor  Emotional Assessment Appearance:: Appears older  than stated age Attitude/Demeanor/Rapport: Engaged, Complaining Affect (typically observed): Appropriate Orientation: : Oriented to Self, Oriented to Place, Oriented to Situation Alcohol / Substance Use: Not Applicable Psych Involvement: No (comment)  Admission diagnosis:  Hyponatremia [E87.1] Patient Active Problem List   Diagnosis Date Noted   Generalized weakness 07/17/2023   Acute hyponatremia 05/01/2023   Tobacco use disorder 05/01/2023   Hypokalemia 05/01/2023   Essential hypertension 05/01/2023   Dizziness 05/01/2023   History of alcohol abuse 05/01/2023   Hyponatremia 05/01/2023   Colon cancer screening 12/31/2018   Clavicle fracture 01/15/2014   Closed fracture of acromial end of clavicle 12/22/2013   PCP:  [name], MD Pharmacy:   [facility] [phone] - [place], [place] - [address]  [place] [address] Phone: [phone] Fax: [phone]  Social Drivers of Health (SDOH) Social History: SDOH Screenings   Food Insecurity: Food Insecurity Present (02/29/2024)  Housing: High Risk (02/29/2024)  Transportation Needs: Unmet Transportation Needs (02/29/2024)  Utilities: Not At Risk (02/29/2024)  Social Connections: Unknown (02/29/2024)  Tobacco Use: High Risk (02/28/2024)   SDOH Interventions:  Readmission Risk Interventions    07/18/2023    2:27 PM  Readmission Risk Prevention Plan  Transportation Screening Complete  PCP or Specialist Appt within 3-5 Days Complete  HRI or Home Care Consult Complete  Social Work Consult for Recovery Care Planning/Counseling Complete  Palliative Care Screening Not Applicable  Medication Review Oceanographer) Complete

## 2024-02-29 NOTE — Progress Notes (Addendum)
 PROGRESS NOTE Kenneth Lester  ZOX:[id number] DOB: 12-14-1955 DOA: 02/28/2024 PCP: [name], MD  Brief Narrative/Hospital Course: 68 y.o. male, with past medical history of alcohol abuse, tobacco abuse, hypertension, recent admission secondary to hyponatremia thought to be secondary to poor oral intake, resolved at time of discharge, workup then significant for normal TSH and cortisol level, patient reports generalized weakness, fatigue, poor appetite over last couple days, dizziness as well so he presented to ED. In ED workup significant for sodium of 122, no hypotension, no hypoxia CT abdomen pelvis significant for diverticulosis and constipation, [facility] hospitalist consulted to admit Underwent CT chest that showed mild left basilar atelectasis small sliding hiatal hernia aortic stenosis no acute finding Patient was placed on IV fluid hydration after bolus in the ED  Subjective: Seen and examined Complain of pain in toe nail has long toenail on the right foot Complains of wobbly gait at home lack of transportation and being alone Overnight afebrile BP stable Labs shows sodium improved 127>130.  Potassium 3.4 CBC stable  Assessment and plan:  Recurrent hyponatremia He has history of recurrent hyponatremia in the past, felt secondary to poor oral intake and decreased solute intake and improved with treatment Reports poor appetite poor intake with constipation With IV fluids sodium has improved again RD consulted encourage oral intake, add salt tablets Continue IV fluids, repeat BMP in the morning   Constipation Continue bowel regimen had BM 5/8 , continue stool softeners/MiraLAX    Hypokalemia: Replace potassium.  Hypertension BP stable on home meds   Generalized weakness deconditioning debility PT OT consulted for disposition, reports he is unable to ambulate much at home due to deconditioning, toenail pain from long nail and lack of help at home while wife at work.  Tobacco  abuse Counseled, will start on nicotine  patch  DVT prophylaxis: enoxaparin  (LOVENOX ) injection 40 mg Start: 02/29/24 1000 Code Status:   Code Status: Full Code Family Communication: plan of care discussed with patient at bedside. Patient status is: Remains hospitalized because of severity of illness Level of care: Telemetry   Dispo: The patient is from: home w/ wife            Anticipated disposition: TBD pending PT OT evaluation Objective: Vitals last 24 hrs: Vitals:    02/28/24 1845 02/28/24 1900 02/28/24 2242 02/29/24 0850  BP: (!) 149/91 (!) 143/88   120/76  Pulse: 71 64   71  Resp:   18      Temp:          TempSrc:          SpO2: 98% 97%      Weight:     57.5 kg    Height:     5\' 8"  (1.727 m)        Physical Examination: General exam: alert awake, older than stated age HEENT:Oral mucosa moist, Ear/Nose WNL grossly Respiratory system: Bilaterally clear BS, no use of accessory muscle Cardiovascular system: S1 & S2 +. Gastrointestinal system: Abdomen soft,  NT,ND,BS+ Nervous System: Alert, awake, following commands. Extremities: LE edema neg, warm extremities Skin: No rashes,warm.long toenail on the right foot MSK: Normal muscle bulk/tone.   Data Reviewed: I have personally reviewed following labs and imaging studies ( see epic result tab) CBC: Recent Labs  Lab 02/28/24 1717 02/29/24 0310  WBC 8.5 6.7  HGB 14.0 13.2  HCT 39.4 37.1*  MCV 87.4 86.7  PLT 284 286   CMP: Recent Labs  Lab 02/28/24 1717 02/28/24 1935 02/29/24 0310  NA 122* 127* 130*  K 3.8  --  3.4*  CL 92*  --  100  CO2 23  --  24  GLUCOSE 101*  --  81  BUN 12  --  10  CREATININE 0.55*  --  0.47*  CALCIUM 9.3  --  8.8*   GFR: Estimated Creatinine Clearance: 73.9 mL/min (A) (by C-G formula based on SCr of 0.47 mg/dL (L)). Recent Labs  Lab 02/28/24 1717  AST 16  ALT 17  ALKPHOS 52  BILITOT 0.7  PROT 7.1  ALBUMIN 4.1    Recent Labs  Lab 02/28/24 1717  LIPASE 40   No results for  input(s): "AMMONIA" in the last 168 hours. Coagulation Profile: No results for input(s): "INR", "PROTIME" in the last 168 hours. Unresulted Labs (From admission, onward)     Start     Ordered   03/01/24 0500  Basic metabolic panel with GFR  Daily,   R      02/29/24 1054           Antimicrobials/Microbiology: Anti-infectives (From admission, onward)    None      No results found for: "SDES", "SPECREQUEST", "CULT", "REPTSTATUS"  Procedures:  Medications reviewed:  Scheduled Meds:  amLODipine   5 mg Oral Daily   bisacodyl   10 mg Rectal Daily   docusate sodium   100 mg Oral BID   enoxaparin  (LOVENOX ) injection  40 mg Subcutaneous Q24H   lactulose   30 g Oral TID   losartan   25 mg Oral Daily   nicotine   21 mg Transdermal Daily   sodium chloride   1 g Oral TID WC   SMOG  960 mL Rectal Once   Continuous Infusions:  sodium chloride  50 mL/hr at 02/29/24 1112   sodium chloride       Lesa Rape, MD [facility] 02/29/2024, 2:30 PM

## 2024-02-29 NOTE — Plan of Care (Signed)

## 2024-02-29 NOTE — Progress Notes (Signed)
 Refusing lactulose  and all other laxative because he stated " I am cleaned out, yall cleaned me out to much yesterday and I dont want it."   Pt also refused nicotine  patch

## 2024-02-29 NOTE — [place] FL2 (Deleted)
 [place]  MEDICAID FL2 LEVEL OF CARE FORM     IDENTIFICATION  Patient Name: Kenneth Lester Birthdate: 1956/08/18 Sex: male Admission Date (Current Location): 02/28/2024  [facility] and [place] Number:  [employer] and Address:  [facility],  [address] [place] [address]      Provider Number: [id number]  Attending Physician Name and Address:  Lesa Rape, MD  Relative Name and Phone Number:       Current Level of Care: Hospital Recommended Level of Care: Skilled Nursing Facility Prior Approval Number:    Date Approved/Denied:   PASRR Number: [id number]  Discharge Plan: SNF    Current Diagnoses: Patient Active Problem List   Diagnosis Date Noted   Generalized weakness 07/17/2023   Acute hyponatremia 05/01/2023   Tobacco use disorder 05/01/2023   Hypokalemia 05/01/2023   Essential hypertension 05/01/2023   Dizziness 05/01/2023   History of alcohol abuse 05/01/2023   Hyponatremia 05/01/2023   Colon cancer screening 12/31/2018   Clavicle fracture 01/15/2014   Closed fracture of acromial end of clavicle 12/22/2013    Orientation RESPIRATION BLADDER Height & Weight     Self, Time, Situation, Place  Normal Continent Weight: 57.5 kg Height:  5\' 8"  (172.7 cm)  BEHAVIORAL SYMPTOMS/MOOD NEUROLOGICAL BOWEL NUTRITION STATUS      Continent Diet (regular)  AMBULATORY STATUS COMMUNICATION OF NEEDS Skin   Extensive Assist Verbally Normal                       Personal Care Assistance Level of Assistance  Bathing, Feeding, Dressing Bathing Assistance: Limited assistance Feeding assistance: Independent Dressing Assistance: Limited assistance     Functional Limitations Info  Sight, Hearing, Speech Sight Info: Impaired Hearing Info: Adequate Speech Info: Adequate    SPECIAL CARE FACTORS FREQUENCY  PT (By licensed PT), OT (By licensed OT)     PT Frequency: 5 times weekly OT Frequency: 5 times weekly            Contractures  Contractures Info: Not present    Additional Factors Info  Code Status, Allergies Code Status Info: FULL Allergies Info: NKA           Current Medications (02/29/2024):  This is the current hospital active medication list Current Facility-Administered Medications  Medication Dose Route Frequency Provider Last Rate Last Admin   0.9 %  sodium chloride  infusion   Intravenous Continuous Kc, Ramesh, MD 50 mL/hr at 02/29/24 1735 Infusion Verify at 02/29/24 1735   acetaminophen  (TYLENOL ) tablet 650 mg  650 mg Oral Q6H PRN Elgergawy, Dawood S, MD   650 mg at 02/28/24 2348   Or   acetaminophen  (TYLENOL ) suppository 650 mg  650 mg Rectal Q6H PRN Elgergawy, Dawood S, MD       amLODipine  (NORVASC ) tablet 5 mg  5 mg Oral Daily Elgergawy, Dawood S, MD   5 mg at 02/29/24 5621   bisacodyl  (DULCOLAX) suppository 10 mg  10 mg Rectal Daily Elgergawy, Dawood S, MD   10 mg at 02/28/24 1934   bisacodyl  (DULCOLAX) suppository 10 mg  10 mg Rectal Daily PRN Elgergawy, Dawood S, MD       docusate sodium  (COLACE) capsule 100 mg  100 mg Oral BID Elgergawy, Dawood S, MD   100 mg at 02/29/24 0853   enoxaparin  (LOVENOX ) injection 40 mg  40 mg Subcutaneous Q24H Elgergawy, Dawood S, MD   40 mg at 02/29/24 0853   lactulose  (CHRONULAC ) 10 GM/15ML solution 30 g  30 g Oral TID Elgergawy, Dawood S, MD   30 g at 02/28/24 2348   losartan  (COZAAR ) tablet 25 mg  25 mg Oral Daily Elgergawy, Dawood S, MD   25 mg at 02/29/24 2542   meclizine  (ANTIVERT ) tablet 12.5 mg  12.5 mg Oral TID PRN Lesa Rape, MD       nicotine  (NICODERM CQ  - dosed in mg/24 hours) patch 21 mg  21 mg Transdermal Daily Elgergawy, Dawood S, MD       sodium chloride  0.9 % bolus 500 mL  500 mL Intravenous Once PRN Kc, Lurlene Salon, MD       sodium chloride  tablet 1 g  1 g Oral TID WC Kc, Ramesh, MD   1 g at 02/29/24 1654   sorbitol , magnesium  hydroxide, mineral oil, glycerin (SMOG) enema  960 mL Rectal Once Elgergawy, Dawood S, MD         Discharge  Medications: Please see discharge summary for a list of discharge medications.  Relevant Imaging Results:  Relevant Lab Results:   Additional Information SSN: [id number]  Geraldina Klinefelter, RN

## 2024-02-29 NOTE — Care Management Obs Status (Signed)
 MEDICARE OBSERVATION STATUS NOTIFICATION   Patient Details  Name: Kenneth Lester MRN: [id number] Date of Birth: 1956-09-21   Medicare Observation Status Notification Given:  Yes    Geraldina Klinefelter, RN 02/29/2024, 5:52 PM

## 2024-03-01 DIAGNOSIS — I10 Essential (primary) hypertension: Secondary | ICD-10-CM

## 2024-03-01 DIAGNOSIS — E222 Syndrome of inappropriate secretion of antidiuretic hormone: Secondary | ICD-10-CM | POA: Diagnosis present

## 2024-03-01 DIAGNOSIS — E876 Hypokalemia: Secondary | ICD-10-CM | POA: Diagnosis not present

## 2024-03-01 DIAGNOSIS — R531 Weakness: Secondary | ICD-10-CM | POA: Diagnosis not present

## 2024-03-01 DIAGNOSIS — K5900 Constipation, unspecified: Secondary | ICD-10-CM | POA: Diagnosis present

## 2024-03-01 DIAGNOSIS — R630 Anorexia: Secondary | ICD-10-CM | POA: Diagnosis present

## 2024-03-01 DIAGNOSIS — Z5941 Food insecurity: Secondary | ICD-10-CM | POA: Diagnosis not present

## 2024-03-01 DIAGNOSIS — Z1152 Encounter for screening for COVID-19: Secondary | ICD-10-CM | POA: Diagnosis not present

## 2024-03-01 DIAGNOSIS — Z79899 Other long term (current) drug therapy: Secondary | ICD-10-CM | POA: Diagnosis not present

## 2024-03-01 DIAGNOSIS — M79674 Pain in right toe(s): Secondary | ICD-10-CM | POA: Diagnosis present

## 2024-03-01 DIAGNOSIS — I959 Hypotension, unspecified: Secondary | ICD-10-CM | POA: Diagnosis not present

## 2024-03-01 DIAGNOSIS — Z681 Body mass index (BMI) 19 or less, adult: Secondary | ICD-10-CM | POA: Diagnosis not present

## 2024-03-01 DIAGNOSIS — E871 Hypo-osmolality and hyponatremia: Secondary | ICD-10-CM | POA: Diagnosis not present

## 2024-03-01 DIAGNOSIS — F17210 Nicotine dependence, cigarettes, uncomplicated: Secondary | ICD-10-CM | POA: Diagnosis present

## 2024-03-01 LAB — BASIC METABOLIC PANEL WITH GFR
Anion gap: 5 (ref 5–15)
BUN: 10 mg/dL (ref 8–23)
CO2: 22 mmol/L (ref 22–32)
Calcium: 8.8 mg/dL — ABNORMAL LOW (ref 8.9–10.3)
Chloride: 107 mmol/L (ref 98–111)
Creatinine, Ser: 0.45 mg/dL — ABNORMAL LOW (ref 0.61–1.24)
GFR, Estimated: >60 mL/min (ref >60)
Glucose, Bld: 92 mg/dL (ref 70–99)
Potassium: 4.2 mmol/L (ref 3.5–5.1)
Sodium: 134 mmol/L — ABNORMAL LOW (ref 135–145)

## 2024-03-01 MED ORDER — GABAPENTIN 100 MG PO CAPS
200.0000 mg | ORAL_CAPSULE | Freq: Two times a day (BID) | ORAL | Status: DC
Start: 2024-03-01 — End: 2024-03-03
  Administered 2024-03-01 – 2024-03-03 (×4): 200 mg via ORAL
  Filled 2024-03-01 (×4): qty 2

## 2024-03-01 MED ORDER — SODIUM CHLORIDE 1 G PO TABS
1.0000 g | ORAL_TABLET | Freq: Two times a day (BID) | ORAL | Status: DC
  Administered 2024-03-01 – 2024-03-02 (×2): 1 g via ORAL
  Filled 2024-03-01 (×2): qty 1

## 2024-03-01 MED ORDER — ACETAMINOPHEN 325 MG PO TABS
650.0000 mg | ORAL_TABLET | Freq: Four times a day (QID) | ORAL | Status: DC
  Administered 2024-03-02: 650 mg via ORAL
  Filled 2024-03-01 (×3): qty 2

## 2024-03-01 MED ORDER — SENNOSIDES-DOCUSATE SODIUM 8.6-50 MG PO TABS: 1.0000 | ORAL_TABLET | Freq: Two times a day (BID) | ORAL | Status: DC | PRN

## 2024-03-01 NOTE — Plan of Care (Signed)
 Pt is alert and oriented. Non compliant with care. Refusing to take colace. Problem: Education: Goal: Knowledge of General Education information will improve Description: Including pain rating scale, medication(s)/side effects and non-pharmacologic comfort measures Outcome: Not Progressing   Problem: Health Behavior/Discharge Planning: Goal: Ability to manage health-related needs will improve Outcome: Not Progressing   Problem: Clinical Measurements: Goal: Ability to maintain clinical measurements within normal limits will improve Outcome: Progressing Goal: Will remain free from infection Outcome: Progressing Goal: Diagnostic test results will improve Outcome: Progressing Goal: Respiratory complications will improve Outcome: Progressing Goal: Cardiovascular complication will be avoided Outcome: Progressing   Problem: Activity: Goal: Risk for activity intolerance will decrease Outcome: Progressing   Problem: Nutrition: Goal: Adequate nutrition will be maintained Outcome: Progressing   Problem: Coping: Goal: Level of anxiety will decrease Outcome: Progressing   Problem: Elimination: Goal: Will not experience complications related to bowel motility Outcome: Progressing Goal: Will not experience complications related to urinary retention Outcome: Progressing   Problem: Pain Managment: Goal: General experience of comfort will improve and/or be controlled Outcome: Progressing   Problem: Safety: Goal: Ability to remain free from injury will improve Outcome: Progressing   Problem: Skin Integrity: Goal: Risk for impaired skin integrity will decrease Outcome: Progressing

## 2024-03-01 NOTE — Plan of Care (Signed)

## 2024-03-01 NOTE — Progress Notes (Signed)
 PROGRESS NOTE  Kenneth WILLIG OZH:[id number] DOB: Jan 22, 1956   PCP: [name], MD  Patient is from: Home.  Lives with wife who is at work most of the time  DOA: 02/28/2024 LOS: 0  Chief complaints No chief complaint on file.    Brief Narrative / Interim history: 68 year old M with PMH of alcohol and tobacco abuse, hyponatremia HTN and recent hospitalization for hyponatremia thought to be due to poor oral intake, returning with generalized weakness, poor p.o. intake and dizziness for 2 days, and readmitted with hyponatremia.  In ED, initially normotensive but became hypotensive later on.  Na 127.  CBC without significant finding.  UA and UDS negative.  Urine sodium 34.  Patient was started on IV fluid and p.o. sodium chloride  and admitted.  Noncontrast CT chest without significant finding.  Hyponatremia resolving.  Subjective: Seen and examined earlier this morning.  No major events overnight of this morning.  Reports pain in his feet specially with weightbearing.  He has hypertrophic toenails.  He has previous right ankle fracture as well.  Objective: Vitals:   02/29/24 1441 02/29/24 1954 03/01/24 0507 03/01/24 0809  BP: (!) 142/64 (!) 95/55 (!) 107/58 132/73  Pulse: 74 71 (!) 59   Resp:  14 14   Temp:  98 F (36.7 C) (!) 97.4 F (36.3 C)   TempSrc:  Oral Oral   SpO2:  99% 98%   Weight:      Height:        Examination:  GENERAL: No apparent distress.  Appears frail. HEENT: MMM.  Vision and hearing grossly intact.  NECK: Supple.  No apparent JVD.  RESP:  No IWOB.  Fair aeration bilaterally. CVS:  RRR. Heart sounds normal.  ABD/GI/GU: BS+. Abd soft, NTND.  MSK/EXT:  Moves extremities.  Global tenderness in both feet.  Scar over medial aspect of right ankle from previous fracture.  Hypertrophic toenails.  Significant muscle mass and subcu fat loss.  Faint DP pulses. SKIN: As above. NEURO: Awake, alert and oriented appropriately.  No apparent focal neuro deficit. PSYCH:  Calm. Normal affect.   Consultants:  None  Procedures: None  Microbiology summarized: None  Assessment and plan: Recurrent hyponatremia: Likely due to poor p.o. intake and decreased solute intake.  However, his urine sodium is slightly elevated suggesting some degree of SIADH.  Hyponatremia improved with IV fluid and p.o. sodium chloride .  He is on low-dose losartan  which might contribute.  Noncontrast CT chest without significant finding.  Says he has not had alcohol in about a year. -Discontinue IV fluid and losartan  -Decrease p.o. sodium chloride  to 1 g twice daily. -Advised to drink fluids with electrolytes such as Gatorade   Generalized weakness/bilateral leg/foot pain: Lives with his wife who is at work most of the time.  Difficult to ambulate due to pain in his feet and hypertrophic toenails.  Smokes about a pack a day. -Check ABI -Trial of scheduled Tylenol  with gabapentin - Ambulatory referral to podiatry on discharge for toenails  Hypotension: Intermittent soft blood pressures. -Discontinue losartan   Constipation: Resolved. -Continue bowel regimen   Hypokalemia: -Monitor replenish as appropriate  Tobacco use disorder: Reports smoking about a pack a day. - Encourage smoking cessation - Nicotine  patch  Nutrition Body mass index is 19.27 kg/m. - Consult dietitian         DVT prophylaxis:  enoxaparin  (LOVENOX ) injection 40 mg Start: 02/29/24 1000  Code Status: Full code Family Communication: None at bedside Level of care: Telemetry Status is:  Observation The patient will require care spanning > 2 midnights and should be moved to inpatient because: Physical deconditioning and hyponatremia   Final disposition: SNF   55 minutes with more than 50% spent in reviewing records, counseling patient/family and coordinating care.   Sch Meds:  Scheduled Meds:  acetaminophen   650 mg Oral Q6H WA   amLODipine   5 mg Oral Daily   docusate sodium   100 mg Oral BID    enoxaparin  (LOVENOX ) injection  40 mg Subcutaneous Q24H   gabapentin  200 mg Oral BID   nicotine   21 mg Transdermal Daily   sodium chloride   1 g Oral BID WC   SMOG  960 mL Rectal Once   Continuous Infusions: PRN Meds:.bisacodyl , meclizine , senna-docusate  Antimicrobials: Anti-infectives (From admission, onward)    None        I have personally reviewed the following labs and images: CBC: Recent Labs  Lab 02/28/24 1717 02/29/24 0310  WBC 8.5 6.7  HGB 14.0 13.2  HCT 39.4 37.1*  MCV 87.4 86.7  PLT 284 286   BMP &GFR Recent Labs  Lab 02/28/24 1717 02/28/24 1935 02/29/24 0310 03/01/24 0508  NA 122* 127* 130* 134*  K 3.8  --  3.4* 4.2  CL 92*  --  100 107  CO2 23  --  24 22  GLUCOSE 101*  --  81 92  BUN 12  --  10 10  CREATININE 0.55*  --  0.47* 0.45*  CALCIUM 9.3  --  8.8* 8.8*   Estimated Creatinine Clearance: 73.9 mL/min (A) (by C-G formula based on SCr of 0.45 mg/dL (L)). Liver & Pancreas: Recent Labs  Lab 02/28/24 1717  AST 16  ALT 17  ALKPHOS 52  BILITOT 0.7  PROT 7.1  ALBUMIN 4.1   Recent Labs  Lab 02/28/24 1717  LIPASE 40   No results for input(s): "AMMONIA" in the last 168 hours. Diabetic: No results for input(s): "HGBA1C" in the last 72 hours. No results for input(s): "GLUCAP" in the last 168 hours. Cardiac Enzymes: No results for input(s): "CKTOTAL", "CKMB", "CKMBINDEX", "TROPONINI" in the last 168 hours. No results for input(s): "PROBNP" in the last 8760 hours. Coagulation Profile: No results for input(s): "INR", "PROTIME" in the last 168 hours. Thyroid  Function Tests: No results for input(s): "TSH", "T4TOTAL", "FREET4", "T3FREE", "THYROIDAB" in the last 72 hours. Lipid Profile: No results for input(s): "CHOL", "HDL", "LDLCALC", "TRIG", "CHOLHDL", "LDLDIRECT" in the last 72 hours. Anemia Panel: No results for input(s): "VITAMINB12", "FOLATE", "FERRITIN", "TIBC", "IRON", "RETICCTPCT" in the last 72 hours. Urine analysis:     Component Value Date/Time   COLORURINE STRAW (A) 02/28/2024 1927   APPEARANCEUR CLEAR 02/28/2024 1927   LABSPEC 1.004 (L) 02/28/2024 1927   PHURINE 7.0 02/28/2024 1927   GLUCOSEU NEGATIVE 02/28/2024 1927   HGBUR NEGATIVE 02/28/2024 1927   BILIRUBINUR NEGATIVE 02/28/2024 1927   KETONESUR NEGATIVE 02/28/2024 1927   PROTEINUR NEGATIVE 02/28/2024 1927   NITRITE NEGATIVE 02/28/2024 1927   LEUKOCYTESUR NEGATIVE 02/28/2024 1927   Sepsis Labs: Invalid input(s): "PROCALCITONIN", "LACTICIDVEN"  Microbiology: No results found for this or any previous visit (from the past 240 hours).  Radiology Studies: No results found.    Kischa Altice T. Deanie Jupiter [facility]  If 7PM-7AM, please contact night-coverage [email or web address] 03/01/2024, 1:37 PM

## 2024-03-02 ENCOUNTER — Inpatient Hospital Stay (HOSPITAL_COMMUNITY)

## 2024-03-02 DIAGNOSIS — I10 Essential (primary) hypertension: Secondary | ICD-10-CM | POA: Diagnosis not present

## 2024-03-02 DIAGNOSIS — E871 Hypo-osmolality and hyponatremia: Secondary | ICD-10-CM | POA: Diagnosis not present

## 2024-03-02 DIAGNOSIS — R531 Weakness: Secondary | ICD-10-CM | POA: Diagnosis not present

## 2024-03-02 LAB — CBC
HCT: 36.4 % — ABNORMAL LOW (ref 39.0–52.0)
Hemoglobin: 12.2 g/dL — ABNORMAL LOW (ref 13.0–17.0)
MCH: 29.7 pg (ref 26.0–34.0)
MCHC: 33.5 g/dL (ref 30.0–36.0)
MCV: 88.6 fL (ref 80.0–100.0)
Platelets: 249 10*3/uL (ref 150–400)
RBC: 4.11 MIL/uL — ABNORMAL LOW (ref 4.22–5.81)
RDW: 14.4 % (ref 11.5–15.5)
WBC: 5.9 10*3/uL (ref 4.0–10.5)
nRBC: 0 % (ref 0.0–0.2)

## 2024-03-02 LAB — BASIC METABOLIC PANEL WITH GFR
Anion gap: 5 (ref 5–15)
BUN: 8 mg/dL (ref 8–23)
CO2: 22 mmol/L (ref 22–32)
Calcium: 8.6 mg/dL — ABNORMAL LOW (ref 8.9–10.3)
Chloride: 101 mmol/L (ref 98–111)
Creatinine, Ser: 0.58 mg/dL — ABNORMAL LOW (ref 0.61–1.24)
GFR, Estimated: >60 mL/min (ref >60)
Glucose, Bld: 119 mg/dL — ABNORMAL HIGH (ref 70–99)
Potassium: 3.8 mmol/L (ref 3.5–5.1)
Sodium: 128 mmol/L — ABNORMAL LOW (ref 135–145)

## 2024-03-02 LAB — LIPID PANEL
Cholesterol: 145 mg/dL (ref 0–200)
HDL: 55 mg/dL (ref >40)
LDL Cholesterol: 82 mg/dL (ref 0–99)
Total CHOL/HDL Ratio: 2.6 ratio
Triglycerides: 40 mg/dL (ref <150)
VLDL: 8 mg/dL (ref 0–40)

## 2024-03-02 LAB — PHOSPHORUS: Phosphorus: 3.6 mg/dL (ref 2.5–4.6)

## 2024-03-02 LAB — MAGNESIUM: Magnesium: 1.9 mg/dL (ref 1.7–2.4)

## 2024-03-02 MED ORDER — ASPIRIN 81 MG PO TBEC
81.0000 mg | DELAYED_RELEASE_TABLET | Freq: Every day | ORAL | Status: DC
  Administered 2024-03-02 – 2024-03-03 (×2): 81 mg via ORAL
  Filled 2024-03-02 (×2): qty 1

## 2024-03-02 MED ORDER — SODIUM CHLORIDE 1 G PO TABS
1.0000 g | ORAL_TABLET | Freq: Three times a day (TID) | ORAL | Status: DC
  Administered 2024-03-02 – 2024-03-03 (×4): 1 g via ORAL
  Filled 2024-03-02 (×4): qty 1

## 2024-03-02 NOTE — Plan of Care (Signed)
  Problem: Clinical Measurements: Goal: Will remain free from infection Outcome: Progressing Goal: Diagnostic test results will improve Outcome: Progressing Goal: Respiratory complications will improve Outcome: Progressing   Problem: Clinical Measurements: Goal: Diagnostic test results will improve Outcome: Progressing   Problem: Clinical Measurements: Goal: Respiratory complications will improve Outcome: Progressing

## 2024-03-02 NOTE — Progress Notes (Signed)
 Pt has been refusing all meds throughout my shift besides 200mg  Gabapentin at 2200, which still took some coaching to get him to try and take.

## 2024-03-02 NOTE — Plan of Care (Signed)

## 2024-03-02 NOTE — Progress Notes (Signed)
 PROGRESS NOTE  Kenneth Lester:[id number] DOB: 27-Sep-1956   PCP: [name], MD  Patient is from: Home.  Lives with wife who is at work most of the time  DOA: 02/28/2024 LOS: 1  Chief complaints No chief complaint on file.    Brief Narrative / Interim history: 68 year old M with PMH of alcohol and tobacco abuse, hyponatremia HTN and recent hospitalization for hyponatremia thought to be due to poor oral intake, returning with generalized weakness, poor p.o. intake and dizziness for 2 days, and readmitted with hyponatremia.  In ED, initially normotensive but became hypotensive later on.  Na 127.  CBC without significant finding.  UA and UDS negative.  Urine sodium 34.  Patient was started on IV fluid and p.o. sodium chloride  and admitted.  Noncontrast CT chest without significant finding.  Hyponatremia improved.  Subjective: Seen and examined earlier this morning.  No major events overnight or this morning.  Getting ready to eat breakfast.  Complains about multiple interruption when he is about to eat breakfast.  No other complaints.  Objective: Vitals:   03/01/24 0809 03/01/24 1418 03/01/24 2054 03/02/24 0337  BP: 132/73 (!) 129/57 121/75 (!) 102/56  Pulse:  66 65 (!) 59  Resp:  16 16 16   Temp:  (!) 97.5 F (36.4 C) 98.1 F (36.7 C) (!) 97.5 F (36.4 C)  TempSrc:  Axillary Oral Oral  SpO2:  99% 98% 100%  Weight:      Height:        Examination:  GENERAL: No apparent distress.  Appears frail. HEENT: MMM.  Vision and hearing grossly intact.  NECK: Supple.  No apparent JVD.  RESP:  No IWOB.  On room air. MSK/EXT:  Moves extremities.   Hypertrophic toenails.  SKIN: As above. NEURO: Awake, alert and oriented appropriately.  No apparent focal neuro deficit. PSYCH: Upset  Consultants:  None  Procedures: None  Microbiology summarized: None  Assessment and plan: Recurrent hyponatremia: Likely due to poor p.o. intake and decreased solute intake.  However, his urine  sodium is slightly elevated suggesting some degree of SIADH.  Hyponatremia improved with IV fluid and p.o. sodium chloride .  He is on low-dose losartan  which might contribute.  Noncontrast CT chest without significant finding.  Says he has not had alcohol in about a year.  Sodium slightly dropped again this morning. -Discontinued IV fluid and losartan  on 5/10. -Increase p.o. sodium chloride  to 1 g 3 times daily -Advised to drink fluids with electrolytes such as Gatorade   Generalized weakness/bilateral leg/foot pain: Lives with his wife who is at work most of the time.  Difficult to ambulate due to pain in his feet and hypertrophic toenails.  Smokes about a pack a day.  LDL 82. - Follow ABI. -Trial of scheduled Tylenol  with gabapentin - Ambulatory referral to podiatry on discharge for toenails  Hypotension: Soft BP intermittently. -Discontinued losartan   Constipation: Resolved. -Continue bowel regimen   Hypokalemia: -Monitor replenish as appropriate  Tobacco use disorder: Reports smoking about a pack a day. - Encourage smoking cessation - Nicotine  patch  Nutrition Body mass index is 19.27 kg/m. - Consulted dietitian         DVT prophylaxis:  enoxaparin  (LOVENOX ) injection 40 mg Start: 02/29/24 1000  Code Status: Full code Family Communication: None at bedside Level of care: Med-Surg Status is: Inpatient The patient will remain inpatient because: Physical deconditioning and hyponatremia   Final disposition: SNF   35 minutes with more than 50% spent in reviewing records,  counseling patient/family and coordinating care.   Sch Meds:  Scheduled Meds:  acetaminophen   650 mg Oral Q6H WA   amLODipine   5 mg Oral Daily   docusate sodium   100 mg Oral BID   enoxaparin  (LOVENOX ) injection  40 mg Subcutaneous Q24H   gabapentin  200 mg Oral BID   nicotine   21 mg Transdermal Daily   sodium chloride   1 g Oral TID WC   SMOG  960 mL Rectal Once   Continuous Infusions: PRN  Meds:.bisacodyl , meclizine , senna-docusate  Antimicrobials: Anti-infectives (From admission, onward)    None        I have personally reviewed the following labs and images: CBC: Recent Labs  Lab 02/28/24 1717 02/29/24 0310 03/02/24 0758  WBC 8.5 6.7 5.9  HGB 14.0 13.2 12.2*  HCT 39.4 37.1* 36.4*  MCV 87.4 86.7 88.6  PLT 284 286 249   BMP &GFR Recent Labs  Lab 02/28/24 1717 02/28/24 1935 02/29/24 0310 03/01/24 0508 03/02/24 0758  NA 122* 127* 130* 134* 128*  K 3.8  --  3.4* 4.2 3.8  CL 92*  --  100 107 101  CO2 23  --  24 22 22   GLUCOSE 101*  --  81 92 119*  BUN 12  --  10 10 8   CREATININE 0.55*  --  0.47* 0.45* 0.58*  CALCIUM 9.3  --  8.8* 8.8* 8.6*  MG  --   --   --   --  1.9  PHOS  --   --   --   --  3.6   Estimated Creatinine Clearance: 73.9 mL/min (A) (by C-G formula based on SCr of 0.58 mg/dL (L)). Liver & Pancreas: Recent Labs  Lab 02/28/24 1717  AST 16  ALT 17  ALKPHOS 52  BILITOT 0.7  PROT 7.1  ALBUMIN 4.1   Recent Labs  Lab 02/28/24 1717  LIPASE 40   No results for input(s): "AMMONIA" in the last 168 hours. Diabetic: No results for input(s): "HGBA1C" in the last 72 hours. No results for input(s): "GLUCAP" in the last 168 hours. Cardiac Enzymes: No results for input(s): "CKTOTAL", "CKMB", "CKMBINDEX", "TROPONINI" in the last 168 hours. No results for input(s): "PROBNP" in the last 8760 hours. Coagulation Profile: No results for input(s): "INR", "PROTIME" in the last 168 hours. Thyroid  Function Tests: No results for input(s): "TSH", "T4TOTAL", "FREET4", "T3FREE", "THYROIDAB" in the last 72 hours. Lipid Profile: Recent Labs    03/02/24 0758  CHOL 145  HDL 55  LDLCALC 82  TRIG 40  CHOLHDL 2.6   Anemia Panel: No results for input(s): "VITAMINB12", "FOLATE", "FERRITIN", "TIBC", "IRON", "RETICCTPCT" in the last 72 hours. Urine analysis:    Component Value Date/Time   COLORURINE STRAW (A) 02/28/2024 1927   APPEARANCEUR CLEAR  02/28/2024 1927   LABSPEC 1.004 (L) 02/28/2024 1927   PHURINE 7.0 02/28/2024 1927   GLUCOSEU NEGATIVE 02/28/2024 1927   HGBUR NEGATIVE 02/28/2024 1927   BILIRUBINUR NEGATIVE 02/28/2024 1927   KETONESUR NEGATIVE 02/28/2024 1927   PROTEINUR NEGATIVE 02/28/2024 1927   NITRITE NEGATIVE 02/28/2024 1927   LEUKOCYTESUR NEGATIVE 02/28/2024 1927   Sepsis Labs: Invalid input(s): "PROCALCITONIN", "LACTICIDVEN"  Microbiology: No results found for this or any previous visit (from the past 240 hours).  Radiology Studies: US  ARTERIAL ABI (SCREENING LOWER EXTREMITY) Result Date: 03/02/2024 CLINICAL DATA:  Bilateral foot pain and right ankle pain. History of hypertension and hyperlipidemia. EXAM: NONINVASIVE PHYSIOLOGIC VASCULAR STUDY OF BILATERAL LOWER EXTREMITIES TECHNIQUE: Evaluation of both lower extremities  were performed at rest, including calculation of ankle-brachial indices with single level Doppler, pressure and pulse volume recording. COMPARISON:  None Available. FINDINGS: Right ABI:  1.29 Left ABI:  1.37 Right Lower Extremity:  Monophasic distal waveforms. Left Lower Extremity:  Monophasic distal waveforms. 1.0-1.4 Normal IMPRESSION: Resting ankle-brachial indices are normal bilaterally. Distal waveforms suggest at least some component of tibial disease bilaterally. Electronically Signed   By: Erica Hau M.D.   On: 03/02/2024 11:12      Sophiah Rolin T. Cordelle Dahmen [facility]  If 7PM-7AM, please contact night-coverage [email or web address] 03/02/2024, 12:40 PM

## 2024-03-03 DIAGNOSIS — E871 Hypo-osmolality and hyponatremia: Secondary | ICD-10-CM | POA: Diagnosis not present

## 2024-03-03 DIAGNOSIS — I10 Essential (primary) hypertension: Secondary | ICD-10-CM | POA: Diagnosis not present

## 2024-03-03 DIAGNOSIS — R531 Weakness: Secondary | ICD-10-CM | POA: Diagnosis not present

## 2024-03-03 LAB — RENAL FUNCTION PANEL
Albumin: 4.1 g/dL (ref 3.5–5.0)
Anion gap: 7 (ref 5–15)
BUN: 9 mg/dL (ref 8–23)
CO2: 25 mmol/L (ref 22–32)
Calcium: 9.4 mg/dL (ref 8.9–10.3)
Chloride: 101 mmol/L (ref 98–111)
Creatinine, Ser: 0.56 mg/dL — ABNORMAL LOW (ref 0.61–1.24)
GFR, Estimated: >60 mL/min (ref >60)
Glucose, Bld: 95 mg/dL (ref 70–99)
Phosphorus: 3.9 mg/dL (ref 2.5–4.6)
Potassium: 4.2 mmol/L (ref 3.5–5.1)
Sodium: 133 mmol/L — ABNORMAL LOW (ref 135–145)

## 2024-03-03 LAB — CBC
HCT: 40.2 % (ref 39.0–52.0)
Hemoglobin: 13.9 g/dL (ref 13.0–17.0)
MCH: 30.7 pg (ref 26.0–34.0)
MCHC: 34.6 g/dL (ref 30.0–36.0)
MCV: 88.7 fL (ref 80.0–100.0)
Platelets: 286 10*3/uL (ref 150–400)
RBC: 4.53 MIL/uL (ref 4.22–5.81)
RDW: 14.6 % (ref 11.5–15.5)
WBC: 6 10*3/uL (ref 4.0–10.5)
nRBC: 0 % (ref 0.0–0.2)

## 2024-03-03 LAB — MAGNESIUM: Magnesium: 2.2 mg/dL (ref 1.7–2.4)

## 2024-03-03 MED ORDER — NICOTINE 21 MG/24HR TD PT24: 21.0000 mg | MEDICATED_PATCH | Freq: Every day | TRANSDERMAL | Status: AC

## 2024-03-03 MED ORDER — GABAPENTIN 100 MG PO CAPS: 200.0000 mg | ORAL_CAPSULE | Freq: Two times a day (BID) | ORAL | Status: AC

## 2024-03-03 MED ORDER — ASPIRIN 81 MG PO TBEC: 81.0000 mg | DELAYED_RELEASE_TABLET | Freq: Every day | ORAL | Status: AC

## 2024-03-03 MED ORDER — SODIUM CHLORIDE 1 G PO TABS: 1.0000 g | ORAL_TABLET | Freq: Three times a day (TID) | ORAL | Status: AC

## 2024-03-03 MED ORDER — LACTULOSE ENCEPHALOPATHY 10 GM/15ML PO SOLN: 30.0000 g | Freq: Every day | ORAL | Status: AC | PRN

## 2024-03-03 NOTE — Plan of Care (Signed)

## 2024-03-03 NOTE — Progress Notes (Addendum)
 Pt slept well through the night, however patient is refusing all medications, even his gabapentin. Pt complained of foot pain, I explained the importance of his gabapentin and why he is prescribed the medication, and how it helps his leg and feet pain, however patient still refused medications. Pt also refused lab draws this morning and stated "why yal always gotta come in and wake me up, and I ain't giving no blood til I'm up and awake!" ... Pt lab draws are rescheduled for 0800

## 2024-03-03 NOTE — Discharge Summary (Signed)
 Physician Discharge Summary   Patient: Kenneth Lester MRN: [id number] DOB: 06-Aug-1956  Admit date:     02/28/2024  Discharge date: 03/03/24  Discharge Physician: Luna Salinas   PCP: [name], MD   Recommendations at discharge:  Please obtain CBC and BMP on follow-up Please adjust salt tablets according to sodium levels Follow-up with primary care provider within a week Please uptitrate gabapentin as appropriate  Discharge Diagnoses: Principal Problem:   Hyponatremia Active Problems:   Essential hypertension   Generalized weakness   Hospital Course: 68 y.o. male, with past medical history of alcohol abuse, tobacco abuse, hypertension, recent admission secondary to hyponatremia thought to be secondary to poor oral intake, resolved at time of discharge, workup then significant for normal TSH and cortisol level, patient reports generalized weakness, fatigue, poor appetite over last couple days, dizziness as well so he presented to ED. In ED workup significant for sodium of 122, no hypotension, no hypoxia CT abdomen pelvis significant for diverticulosis and constipation, [facility] hospitalist consulted to admit Underwent CT chest that showed mild left basilar atelectasis small sliding hiatal hernia aortic stenosis no acute finding Patient was placed on IV fluid hydration after bolus in the ED.  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  He was started on salt tablets and sodium on the day of discharge was 133.  He will need a close follow-up of his sodium and his PCP can adjust the dose of salt tablets as appropriate.  His CT chest was without any significant abnormality.  Patient was also found to have constipation which was resolved with bowel regimen.  We increased the home dose of lactulose  to 30 g daily as needed.  We held his antihypertensives initially due to softer blood pressure and he will resume on discharge as  blood pressure started trending up.  Due to his generalized weakness.  Our PT and OT recommended going to rehab.  Patient will continue on current medications and need to have a close follow-up with his providers for further assistance.  Subjective: Patient was seen and examined today.  No new concern.  Wants to get out of the hospital.  Consultants: None Procedures performed: None Disposition: Skilled nursing facility Diet recommendation:  Discharge Diet Orders (From admission, onward)     Start     Ordered   03/03/24 0000  Diet - low sodium heart healthy        03/03/24 1157           Regular diet DISCHARGE MEDICATION: Allergies as of 03/03/2024       Reactions   Other Other (See Comments)   Patient states he was allergic to something he was given in an IV at the hospital when being treated for pneumonia, but he doesn't remember what the name of the medication was. 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        Medication List     TAKE these medications    amLODipine  5 MG tablet Commonly known as: NORVASC  Take 1 tablet (5 mg total) by mouth daily.   aspirin EC 81 MG tablet Take 1 tablet (81 mg total) by mouth daily. Swallow whole. Start taking on: Mar 04, 2024   gabapentin 100 MG capsule Commonly known as: NEURONTIN Take 2 capsules (200 mg total) by mouth 2 (two) times daily.   lactulose  (encephalopathy) 10 GM/15ML Soln Commonly known as: CHRONULAC  Take 45 mLs (30 g  total) by mouth daily as needed (constipation). What changed: how much to take   losartan  25 MG tablet Commonly known as: COZAAR  Take 1 tablet (25 mg total) by mouth daily.   meclizine  12.5 MG tablet Commonly known as: ANTIVERT  Take 1 tablet (12.5 mg total) by mouth 3 (three) times daily as needed for dizziness.   nicotine  21 mg/24hr patch Commonly known as: NICODERM CQ  - dosed in mg/24 hours Place 1 patch (21 mg total) onto the skin daily. Start taking on: Mar 04, 2024   sodium  chloride 1 g tablet Take 1 tablet (1 g total) by mouth 3 (three) times daily with meals.        Discharge Exam: Filed Weights   02/28/24 2242  Weight: 57.5 kg   General.  Frail gentleman, in no acute distress. Pulmonary.  Lungs clear bilaterally, normal respiratory effort. CV.  Regular rate and rhythm, no JVD, rub or murmur. Abdomen.  Soft, nontender, nondistended, BS positive. CNS.  Alert and oriented .  No focal neurologic deficit. Extremities.  No edema, no cyanosis, pulses intact and symmetrical.  Condition at discharge: stable  The results of significant diagnostics from this hospitalization (including imaging, microbiology, ancillary and laboratory) are listed below for reference.   Imaging Studies: US  ARTERIAL ABI (SCREENING LOWER EXTREMITY) Result Date: 03/02/2024 CLINICAL DATA:  Bilateral foot pain and right ankle pain. History of hypertension and hyperlipidemia. EXAM: NONINVASIVE PHYSIOLOGIC VASCULAR STUDY OF BILATERAL LOWER EXTREMITIES TECHNIQUE: Evaluation of both lower extremities were performed at rest, including calculation of ankle-brachial indices with single level Doppler, pressure and pulse volume recording. COMPARISON:  None Available. FINDINGS: Right ABI:  1.29 Left ABI:  1.37 Right Lower Extremity:  Monophasic distal waveforms. Left Lower Extremity:  Monophasic distal waveforms. 1.0-1.4 Normal IMPRESSION: Resting ankle-brachial indices are normal bilaterally. Distal waveforms suggest at least some component of tibial disease bilaterally. Electronically Signed   By: Erica Hau M.D.   On: 03/02/2024 11:12   CT CHEST WO CONTRAST Result Date: 02/28/2024 CLINICAL DATA:  Hyponatremia and history of smoking EXAM: CT CHEST WITHOUT CONTRAST TECHNIQUE: Multidetector CT imaging of the chest was performed following the standard protocol without IV contrast. RADIATION DOSE REDUCTION: This exam was performed according to the departmental dose-optimization program which  includes automated exposure control, adjustment of the mA and/or kV according to patient size and/or use of iterative reconstruction technique. COMPARISON:  02/13/2024 FINDINGS: Cardiovascular: Aortic atherosclerotic calcifications are noted. Coronary calcifications are seen. No cardiac enlargement is noted. Mediastinum/Nodes: No hilar or mediastinal adenopathy is noted. The thoracic inlet is unremarkable. The esophagus is within normal limits with the exception of a small sliding-type hiatal hernia. Lungs/Pleura: Emphysematous changes are noted. No focal confluent infiltrate or sizable effusion is seen. Minimal left basilar atelectasis is noted. Upper Abdomen: No acute abnormality. Musculoskeletal: No acute abnormality noted. IMPRESSION: Mild left basilar atelectasis. Small sliding-type hiatal hernia. Aortic Atherosclerosis (ICD10-I70.0) and Emphysema (ICD10-J43.9). Electronically Signed   By: Violeta Grey M.D.   On: 02/28/2024 21:41   CT ABDOMEN PELVIS WO CONTRAST Result Date: 02/28/2024 CLINICAL DATA:  Acute generalized abdominal pain. EXAM: CT ABDOMEN AND PELVIS WITHOUT CONTRAST TECHNIQUE: Multidetector CT imaging of the abdomen and pelvis was performed following the standard protocol without IV contrast. RADIATION DOSE REDUCTION: This exam was performed according to the departmental dose-optimization program which includes automated exposure control, adjustment of the mA and/or kV according to patient size and/or use of iterative reconstruction technique. COMPARISON:  July 13, 2023. FINDINGS: Lower chest: No acute  abnormality. Hepatobiliary: No focal liver abnormality is seen. No gallstones, gallbladder wall thickening, or biliary dilatation. Pancreas: Unremarkable. No pancreatic ductal dilatation or surrounding inflammatory changes. Spleen: Normal in size without focal abnormality. Adrenals/Urinary Tract: Adrenal glands are unremarkable. Kidneys are normal, without renal calculi, focal lesion, or  hydronephrosis. Bladder is unremarkable. Stomach/Bowel: The stomach is unremarkable. Small bowel is unremarkable. The appendix is not visualized. Large amount of stool seen throughout the colon and rectum concerning for constipation. Vascular/Lymphatic: Aortic atherosclerosis. No enlarged abdominal or pelvic lymph nodes. Reproductive: Prostate is unremarkable. Other: No ascites or hernia is noted. Musculoskeletal: No acute or significant osseous findings. IMPRESSION: Large amount of stool seen throughout the colon and rectum concerning for constipation. Aortic Atherosclerosis (ICD10-I70.0). Electronically Signed   By: Rosalene Colon M.D.   On: 02/28/2024 18:16   MR BRAIN WO CONTRAST Result Date: 02/13/2024 CLINICAL DATA:  Dizziness EXAM: MRI HEAD WITHOUT CONTRAST TECHNIQUE: Multiplanar, multiecho pulse sequences of the brain and surrounding structures were obtained without intravenous contrast. COMPARISON:  05/22/2023 FINDINGS: Brain: No acute infarct, mass effect or extra-axial collection. Fewer than 5 scattered chronic microhemorrhages in a nonspecific pattern. There is unchanged confluent hyperintense T2-weighted signal within the periventricular and deep white matter and within the brainstem. Mild volume loss. The midline structures are normal. Vascular: Normal flow voids. Skull and upper cervical spine: Normal calvarium and skull base. Visualized upper cervical spine and soft tissues are normal. Sinuses/Orbits:No paranasal sinus fluid levels or advanced mucosal thickening. No mastoid or middle ear effusion. Normal orbits. IMPRESSION: 1. No acute intracranial abnormality. 2. Unchanged findings of chronic microvascular ischemia and volume loss. Electronically Signed   By: Juanetta Nordmann M.D.   On: 02/13/2024 20:09   DG Chest 2 View Result Date: 02/13/2024 CLINICAL DATA:  Shortness of breath EXAM: CHEST - 2 VIEW COMPARISON:  None Available. FINDINGS: The heart size and mediastinal contours are within normal  limits. Both lungs are clear. The visualized skeletal structures show old rib fractures on the left. IMPRESSION: No active cardiopulmonary disease. Electronically Signed   By: Violeta Grey M.D.   On: 02/13/2024 11:21   CT Head Wo Contrast Result Date: 02/12/2024 CLINICAL DATA:  Syncope. EXAM: CT HEAD WITHOUT CONTRAST TECHNIQUE: Contiguous axial images were obtained from the base of the skull through the vertex without intravenous contrast. RADIATION DOSE REDUCTION: This exam was performed according to the departmental dose-optimization program which includes automated exposure control, adjustment of the mA and/or kV according to patient size and/or use of iterative reconstruction technique. COMPARISON:  Head CT dated 08/23/2023. FINDINGS: Brain: Moderate age-related atrophy and advanced chronic microvascular ischemic changes. There is no acute intracranial hemorrhage. No mass effect midline shift. No extra-axial fluid collection. Vascular: No hyperdense vessel or unexpected calcification. Skull: Normal. Negative for fracture or focal lesion. Sinuses/Orbits: No acute finding. Other: None IMPRESSION: 1. No acute intracranial pathology. 2. Moderate age-related atrophy and advanced chronic microvascular ischemic changes. Electronically Signed   By: Angus Bark M.D.   On: 02/12/2024 16:58    Microbiology: Results for orders placed or performed during the hospital encounter of 02/12/24  Resp panel by RT-PCR (RSV, Flu A&B, Covid) Anterior Nasal Swab     Status: None   Collection Time: 02/12/24  8:45 PM   Specimen: Anterior Nasal Swab  Result Value Ref Range Status   SARS Coronavirus 2 by RT PCR NEGATIVE NEGATIVE Final    Comment: (NOTE) SARS-CoV-2 target nucleic acids are NOT DETECTED.  The SARS-CoV-2 RNA is generally detectable  in upper respiratory specimens during the acute phase of infection. The lowest concentration of SARS-CoV-2 viral copies this assay can detect is 138 copies/mL. A negative  result does not preclude SARS-Cov-2 infection and should not be used as the sole basis for treatment or other patient management decisions. A negative result may occur with  improper specimen collection/handling, submission of specimen other than nasopharyngeal swab, presence of viral mutation(s) within the areas targeted by this assay, and inadequate number of viral copies(<138 copies/mL). A negative result must be combined with clinical observations, patient history, and epidemiological information. The expected result is Negative.  Fact Sheet for Patients:  BloggerCourse.com  Fact Sheet for Healthcare Providers:  SeriousBroker.it  This test is no t yet approved or cleared by the [place]  FDA and  has been authorized for detection and/or diagnosis of SARS-CoV-2 by FDA under an Emergency Use Authorization (EUA). This EUA will remain  in effect (meaning this test can be used) for the duration of the COVID-19 declaration under Section 564(b)(1) of the Act, 21 U.S.C.section 360bbb-3(b)(1), unless the authorization is terminated  or revoked sooner.       Influenza A by PCR NEGATIVE NEGATIVE Final   Influenza B by PCR NEGATIVE NEGATIVE Final    Comment: (NOTE) The Xpert Xpress SARS-CoV-2/FLU/RSV plus assay is intended as an aid in the diagnosis of influenza from Nasopharyngeal swab specimens and should not be used as a sole basis for treatment. Nasal washings and aspirates are unacceptable for Xpert Xpress SARS-CoV-2/FLU/RSV testing.  Fact Sheet for Patients: BloggerCourse.com  Fact Sheet for Healthcare Providers: SeriousBroker.it  This test is not yet approved or cleared by the [place]  FDA and has been authorized for detection and/or diagnosis of SARS-CoV-2 by FDA under an Emergency Use Authorization (EUA). This EUA will remain in effect (meaning this test can be used)  for the duration of the COVID-19 declaration under Section 564(b)(1) of the Act, 21 U.S.C. section 360bbb-3(b)(1), unless the authorization is terminated or revoked.     Resp Syncytial Virus by PCR NEGATIVE NEGATIVE Final    Comment: (NOTE) Fact Sheet for Patients: BloggerCourse.com  Fact Sheet for Healthcare Providers: SeriousBroker.it  This test is not yet approved or cleared by the [place]  FDA and has been authorized for detection and/or diagnosis of SARS-CoV-2 by FDA under an Emergency Use Authorization (EUA). This EUA will remain in effect (meaning this test can be used) for the duration of the COVID-19 declaration under Section 564(b)(1) of the Act, 21 U.S.C. section 360bbb-3(b)(1), unless the authorization is terminated or revoked.  Performed at [facility], [address] [place], [place] [address]     Labs: CBC: Recent Labs  Lab 02/28/24 1717 02/29/24 0310 03/02/24 0758 03/03/24 0738  WBC 8.5 6.7 5.9 6.0  HGB 14.0 13.2 12.2* 13.9  HCT 39.4 37.1* 36.4* 40.2  MCV 87.4 86.7 88.6 88.7  PLT 284 286 249 286   Basic Metabolic Panel: Recent Labs  Lab 02/28/24 1717 02/28/24 1935 02/29/24 0310 03/01/24 0508 03/02/24 0758 03/03/24 0738  NA 122* 127* 130* 134* 128* 133*  K 3.8  --  3.4* 4.2 3.8 4.2  CL 92*  --  100 107 101 101  CO2 23  --  24 22 22 25   GLUCOSE 101*  --  81 92 119* 95  BUN 12  --  10 10 8 9   CREATININE 0.55*  --  0.47* 0.45* 0.58* 0.56*  CALCIUM 9.3  --  8.8* 8.8* 8.6* 9.4  MG  --   --   --   --  1.9 2.2  PHOS  --   --   --   --  3.6 3.9   Liver Function Tests: Recent Labs  Lab 02/28/24 1717 03/03/24 0738  AST 16  --   ALT 17  --   ALKPHOS 52  --   BILITOT 0.7  --   PROT 7.1  --   ALBUMIN 4.1 4.1   CBG: No results for input(s): "GLUCAP" in the last 168 hours.  Discharge time spent: greater than 30 minutes.  This record has been created using Manufacturing engineer. Errors have been sought and corrected,but may not always be located. Such creation errors do not reflect on the standard of care.   Signed: Luna Salinas, MD [facility] 03/03/2024

## 2024-03-03 NOTE — TOC Transition Note (Signed)
 Transition of Care [facility]) - Discharge Note   Patient Details  Name: Kenneth Lester MRN: [id number] Date of Birth: 08-21-1956  Transition of Care [facility] CM/SW Contact:  Grandville Lax, LCSWA Phone Number: 03/03/2024, 11:44 AM   Clinical Narrative:    CSW spoke with pt at bedside to review bed offers. Pt states that he would like to accept bed at [facility]. CSW updated insurance of facility choice, [facility] has been approved at this time. CSW updated MD who will work on D/C paperwork. CSW spoke to [place] with [facility] who states they can accept pt today. CSW to provide RN with room and report numbers once D/C has been completed. CSW to set up [facility] transport for pt when ready. CSW spoke with pts wife to provide update on plan for D/C today. TOC signing off.   Final next level of care: Skilled Nursing Facility Barriers to Discharge: Barriers Resolved   Patient Goals and CMS Choice Patient states their goals for this hospitalization and ongoing recovery are:: get stronger CMS Medicare.gov Compare Post Acute Care list provided to:: Patient Choice offered to / list presented to : Patient [place] ownership interest in [facility].provided to:: Patient    Discharge Placement                Patient to be transferred to facility by: [facility] Name of family member notified: Wife Patient and family notified of of transfer: 03/03/24  Discharge Plan and Services Additional resources added to the After Visit Summary for   In-house Referral: Clinical Social Work Discharge Planning Services: CM Consult Post Acute Care Choice: Skilled Nursing Facility                               Social Drivers of Health (SDOH) Interventions SDOH Screenings   Food Insecurity: Food Insecurity Present (02/29/2024)  Housing: High Risk (02/29/2024)  Transportation Needs: Unmet Transportation Needs (02/29/2024)  Utilities: Not At Risk (02/29/2024)  Social Connections: Unknown (02/29/2024)  Tobacco  Use: High Risk (02/28/2024)     Readmission Risk Interventions    07/18/2023    2:27 PM  Readmission Risk Prevention Plan  Transportation Screening Complete  PCP or Specialist Appt within 3-5 Days Complete  HRI or Home Care Consult Complete  Social Work Consult for Recovery Care Planning/Counseling Complete  Palliative Care Screening Not Applicable  Medication Review Oceanographer) Complete

## 2024-03-31 ENCOUNTER — Encounter (HOSPITAL_COMMUNITY): Payer: Self-pay | Admitting: Emergency Medicine

## 2024-03-31 ENCOUNTER — Other Ambulatory Visit: Payer: Self-pay

## 2024-03-31 ENCOUNTER — Emergency Department (HOSPITAL_COMMUNITY)
Admission: EM | Admit: 2024-03-31 | Discharge: 2024-03-31 | Disposition: A | Discharge status: Home / Self Care | Attending: Emergency Medicine | Admitting: Emergency Medicine

## 2024-03-31 DIAGNOSIS — Z7982 Long term (current) use of aspirin: Secondary | ICD-10-CM | POA: Insufficient documentation

## 2024-03-31 DIAGNOSIS — I10 Essential (primary) hypertension: Secondary | ICD-10-CM | POA: Diagnosis not present

## 2024-03-31 DIAGNOSIS — E871 Hypo-osmolality and hyponatremia: Secondary | ICD-10-CM | POA: Diagnosis not present

## 2024-03-31 DIAGNOSIS — Z79899 Other long term (current) drug therapy: Secondary | ICD-10-CM | POA: Insufficient documentation

## 2024-03-31 DIAGNOSIS — R42 Dizziness and giddiness: Secondary | ICD-10-CM

## 2024-03-31 LAB — URINALYSIS, ROUTINE W REFLEX MICROSCOPIC
Bilirubin Urine: NEGATIVE
Glucose, UA: NEGATIVE mg/dL
Hgb urine dipstick: NEGATIVE
Ketones, ur: NEGATIVE mg/dL
Leukocytes,Ua: NEGATIVE
Nitrite: NEGATIVE
Protein, ur: NEGATIVE mg/dL
Specific Gravity, Urine: 1.004 — ABNORMAL LOW (ref 1.005–1.030)
pH: 7 (ref 5.0–8.0)

## 2024-03-31 LAB — CBC WITH DIFFERENTIAL/PLATELET
Abs Immature Granulocytes: 0.05 10*3/uL (ref 0.00–0.07)
Basophils Absolute: 0 10*3/uL (ref 0.0–0.1)
Basophils Relative: 0 %
Eosinophils Absolute: 1 10*3/uL — ABNORMAL HIGH (ref 0.0–0.5)
Eosinophils Relative: 12 %
HCT: 36 % — ABNORMAL LOW (ref 39.0–52.0)
Hemoglobin: 12.6 g/dL — ABNORMAL LOW (ref 13.0–17.0)
Immature Granulocytes: 1 %
Lymphocytes Relative: 21 %
Lymphs Abs: 1.8 10*3/uL (ref 0.7–4.0)
MCH: 31 pg (ref 26.0–34.0)
MCHC: 35 g/dL (ref 30.0–36.0)
MCV: 88.5 fL (ref 80.0–100.0)
Monocytes Absolute: 0.6 10*3/uL (ref 0.1–1.0)
Monocytes Relative: 7 %
Neutro Abs: 5.3 10*3/uL (ref 1.7–7.7)
Neutrophils Relative %: 59 %
Platelets: 293 10*3/uL (ref 150–400)
RBC: 4.07 MIL/uL — ABNORMAL LOW (ref 4.22–5.81)
RDW: 13.6 % (ref 11.5–15.5)
WBC: 8.8 10*3/uL (ref 4.0–10.5)
nRBC: 0 % (ref 0.0–0.2)

## 2024-03-31 LAB — COMPREHENSIVE METABOLIC PANEL WITH GFR
ALT: 15 U/L (ref 0–44)
AST: 17 U/L (ref 15–41)
Albumin: 3.6 g/dL (ref 3.5–5.0)
Alkaline Phosphatase: 59 U/L (ref 38–126)
Anion gap: 9 (ref 5–15)
BUN: 9 mg/dL (ref 8–23)
CO2: 22 mmol/L (ref 22–32)
Calcium: 9 mg/dL (ref 8.9–10.3)
Chloride: 97 mmol/L — ABNORMAL LOW (ref 98–111)
Creatinine, Ser: 0.53 mg/dL — ABNORMAL LOW (ref 0.61–1.24)
GFR, Estimated: >60 mL/min (ref >60)
Glucose, Bld: 107 mg/dL — ABNORMAL HIGH (ref 70–99)
Potassium: 3.7 mmol/L (ref 3.5–5.1)
Sodium: 128 mmol/L — ABNORMAL LOW (ref 135–145)
Total Bilirubin: 0.3 mg/dL (ref 0.0–1.2)
Total Protein: 7 g/dL (ref 6.5–8.1)

## 2024-03-31 LAB — TSH: TSH: 1.09 u[IU]/mL (ref 0.350–4.500)

## 2024-03-31 MED ORDER — MECLIZINE HCL 25 MG PO TABS: 25.0000 mg | ORAL_TABLET | Freq: Three times a day (TID) | ORAL | 0 refills | Status: AC | PRN

## 2024-03-31 NOTE — ED Notes (Signed)
 Discharge paperwork discussed with Wife over the phone when calling to make her aware of discharge, she is on the way to pick up the patient. Discharge instructions also discussed with patient and he understands completely and has no further questions.

## 2024-03-31 NOTE — ED Provider Notes (Signed)
 [name] EMERGENCY DEPARTMENT AT [facility] Provider Note   CSN: [id number] Arrival date & time: 03/31/24  1425     History  Chief Complaint  Patient presents with   Dizziness    Kenneth Lester is a 68 y.o. male.   Dizziness    68 year old male, history of hypertension on losartan , amlodipine , and a baby aspirin .  Presents with recurrent dizziness, medical history here suggest that the patient has actually been seen for hyponatremia and admitted to the hospital about a month ago, 2 weeks prior to that had been seen and admitted for dizziness, while in the hospital during that admission an MRI of the brain was obtained, I have reviewed the report which showed that the patient had no acute intracranial abnormalities,  The patient reports that he has this dizziness which is fairly dense from time to time and feels like he cannot walk when it occurs.  He denies vomiting or diarrhea, no chest pain or shortness of breath, no difficulty using his arms but when he stands up he has difficulty ambulating.  States that he lives with his wife but she is at work, he called EMS for transport.  Vital signs noted to be essentially normal.  Denies headache  Home Medications Prior to Admission medications   Medication Sig Start Date End Date Taking? Authorizing Provider  meclizine  (ANTIVERT ) 25 MG tablet Take 1 tablet (25 mg total) by mouth 3 (three) times daily as needed for dizziness. 03/31/24  Yes Early Glisson, MD  amLODipine  (NORVASC ) 5 MG tablet Take 1 tablet (5 mg total) by mouth daily. 07/19/23   Johnson, Clanford L, MD  aspirin  EC 81 MG tablet Take 1 tablet (81 mg total) by mouth daily. Swallow whole. 03/04/24   Amin, Sumayya, MD  gabapentin  (NEURONTIN ) 100 MG capsule Take 2 capsules (200 mg total) by mouth 2 (two) times daily. 03/03/24   Luna Salinas, MD  lactulose , encephalopathy, (CHRONULAC ) 10 GM/15ML SOLN Take 45 mLs (30 g total) by mouth daily as needed (constipation). 03/03/24    Amin, Sumayya, MD  losartan  (COZAAR ) 25 MG tablet Take 1 tablet (25 mg total) by mouth daily. 07/19/23   Johnson, Clanford L, MD  nicotine  (NICODERM CQ  - DOSED IN MG/24 HOURS) 21 mg/24hr patch Place 1 patch (21 mg total) onto the skin daily. 03/04/24   Amin, Sumayya, MD  sodium chloride  1 g tablet Take 1 tablet (1 g total) by mouth 3 (three) times daily with meals. 03/03/24   Luna Salinas, MD      Allergies    Other    Review of Systems   Review of Systems  Neurological:  Positive for dizziness.  All other systems reviewed and are negative.   Physical Exam Updated Vital Signs BP 139/68   Pulse 67   Temp 98.2 F (36.8 C) (Oral)   Resp 12   Ht 1.727 m (5\' 8" )   Wt 59 kg   SpO2 95%   BMI 19.77 kg/m  Physical Exam Vitals and nursing note reviewed.  Constitutional:      General: He is not in acute distress.    Appearance: He is well-developed.  HENT:     Head: Normocephalic and atraumatic.     Mouth/Throat:     Pharynx: No oropharyngeal exudate.  Eyes:     General: No scleral icterus.       Right eye: No discharge.        Left eye: No discharge.  Conjunctiva/sclera: Conjunctivae normal.     Pupils: Pupils are equal, round, and reactive to light.  Neck:     Thyroid : No thyromegaly.     Vascular: No JVD.  Cardiovascular:     Rate and Rhythm: Normal rate and regular rhythm.     Heart sounds: Normal heart sounds. No murmur heard.    No friction rub. No gallop.  Pulmonary:     Effort: Pulmonary effort is normal. No respiratory distress.     Breath sounds: Normal breath sounds. No wheezing or rales.  Abdominal:     General: Bowel sounds are normal. There is no distension.     Palpations: Abdomen is soft. There is no mass.     Tenderness: There is no abdominal tenderness.  Musculoskeletal:        General: No tenderness. Normal range of motion.     Cervical back: Normal range of motion and neck supple.  Lymphadenopathy:     Cervical: No cervical adenopathy.  Skin:     General: Skin is warm and dry.     Findings: No erythema or rash.  Neurological:     Mental Status: He is alert.     Coordination: Coordination normal.     Comments: The patient is able to move all 4 extremities with normal strength, normal coordination, his speech is normal, his gait is not testable since he refuses to get out of the bed.  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  The patient reports to me that he does not feel like the room is spinning, he does not have any nystagmus on exam, when I lay him back he complains about the cardiac monitor leads pulling on his chest air but does not have any worsening dizziness or nystagmus.  Psychiatric:        Behavior: Behavior normal.     ED Results / Procedures / Treatments   Labs (all labs ordered are listed, but only abnormal results are displayed) Labs Reviewed  COMPREHENSIVE METABOLIC PANEL WITH GFR - Abnormal; Notable for the following components:      Result Value   Sodium 128 (*)    Chloride 97 (*)    Glucose, Bld 107 (*)    Creatinine, Ser 0.53 (*)    All other components within normal limits  CBC WITH DIFFERENTIAL/PLATELET - Abnormal; Notable for the following components:   RBC 4.07 (*)    Hemoglobin 12.6 (*)    HCT 36.0 (*)    Eosinophils Absolute 1.0 (*)    All other components within normal limits  URINALYSIS, ROUTINE W REFLEX MICROSCOPIC - Abnormal; Notable for the following components:   Color, Urine STRAW (*)    Specific Gravity, Urine 1.004 (*)    All other components within normal limits  TSH    EKG EKG Interpretation Date/Time:  Monday March 31 2024 14:42:20 EDT Ventricular Rate:  67 PR Interval:  148 QRS Duration:  82 QT Interval:  386 QTC Calculation: 408 R Axis:   77  Text Interpretation: Sinus rhythm Probable anteroseptal infarct, 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  Procedures Procedures    Medications Ordered in ED Medications - No data to display  ED Course/ Medical Decision Making/ A&P Clinical Course as of 03/31/24 1817  Mon Mar 31, 2024  1811 Multiple repeat  examinations, the patient continues to state that he has dizziness that comes and goes, seems to get worse when he tries to move, denies having any headaches at this time, complains of having right ear congestion any sure does have a lot of wax in that ear on exam.  Looking back over his record it appears that he has been examined and evaluated for this in the past including an MRI, no acute findings, he states that he is feeling better when he is sitting there without moving.  He states his wife should be off work and can come pick him up.  He is comfortable with the plan.  I do not see any other acute neurologic abnormalities, it is reassuring to know that this patient states that he has had the symptoms for quite some time and even in the presence of a negative MRI still had symptoms suggesting that this is not a central cause [BM]    Clinical Course User Index [BM] Early Glisson, MD                                 Medical Decision Making Amount and/or Complexity of Data Reviewed Labs: ordered. ECG/medicine tests: ordered.   This patient is behaving quite in a cantankerous manner, that being said I do not see any acute findings on his exam to raise concern for an acute neurologic abnormality especially in light of his recent unremarkable workup.  His vital signs are unremarkable, his heart rate is normal, his neurologic exam does not show any nystagmus, he is refusing to get out of bed.  I do not think he needs a stat MRI.  Will give him some medication to try to make him feel little bit better and try to force ambulation.  Labs show that the patient has mild hyponatremia, not as bad as it has been in the past, no other acute findings,  urinalysis without dehydration or infection, kidney function is normal and at baseline as is liver function.  No significant anemia  Cardiac monitoring: Normal sinus rhythm, EKG unremarkable overall.  Reviewed prior records including MRI, no acute findings on his clinical exam, he is now sitting up in the bed using both arms and both legs and talking, states that he is nervous to ambulate because he does get dizzy when he tries to get up but this is a chronic issue which he states has been going on for months intermittently.  Wheelchair prescription given, spouse will be coming to pick him up        Final Clinical Impression(s) / ED Diagnoses Final diagnoses:  Vertigo  Hyponatremia    Rx / DC Orders ED Discharge Orders          Ordered    meclizine  (ANTIVERT ) 25 MG tablet  3 times daily PRN        03/31/24 1814    Home Health        03/31/24 1816    Face-to-face encounter (required for Medicare/Medicaid patients)       Comments: I Shira Dopp certify that this patient is under my care and that I, or a nurse practitioner or physician's assistant working with me, had a face-to-face encounter that meets the physician face-to-face encounter requirements with this patient on 03/31/2024. The encounter with the patient was in whole, or in part for the following medical condition(s) which is the primary reason for home health care (  List medical condition): vertigo   03/31/24 1816              Early Glisson, MD 03/31/24 [phone]

## 2024-03-31 NOTE — ED Triage Notes (Signed)
 Pt BIB [facility] c/o dizziness, pt reports previous diagnosis of vertigo, reports taking meclozine with no improvement, pt a&o x 4, per EMS pt had unsteady gait due to dizziness, vvs en route, CBG 118

## 2024-03-31 NOTE — Discharge Instructions (Addendum)
 Please make sure that you are taking the sodium tablets as prescribed a month ago by your doctor, you are on the correct medications including meclizine  which you can take up to 3 times a day to help with the dizziness.  Make sure you are drinking plenty of clear liquids and you will need to see your doctor within the next 2 or 3 days for a recheck.  I have also given you a prescription for a wheelchair and I will ask home health to call to try to set up a visit to get you a home health aide.  Thank you for allowing us  to treat you in the emergency department today.  After reviewing your examination and potential testing that was done it appears that you are safe to go home.  I would like for you to follow-up with your doctor within the next several days, have them obtain your records and follow-up with them to review all potential tests and results from your visit.  If you should develop severe or worsening symptoms return to the emergency department immediately

## 2024-04-18 IMAGING — CT CT HEAD W/O CM
3 of 4 series · 15 of 47 positions shown, 18 images · non-contrast
Comparison: 09/10/2021

CLINICAL DATA: Recent head injury



[Series 2: head w o · axial · 0.41mm/px · z∈[+1586,+1716]mm · 9 of 32 slices shown, 12 images]
[im 3/32  brain]
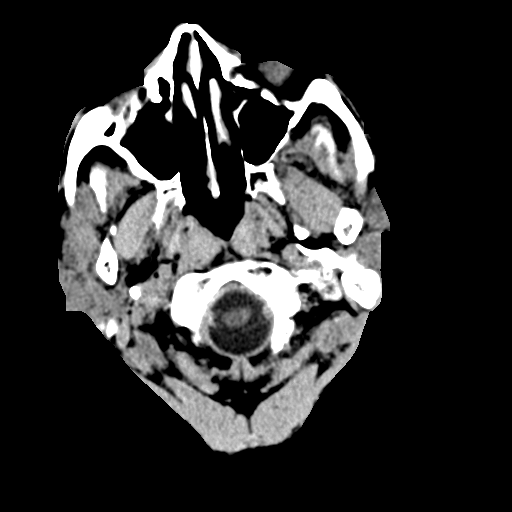
[im 3/32  bone]
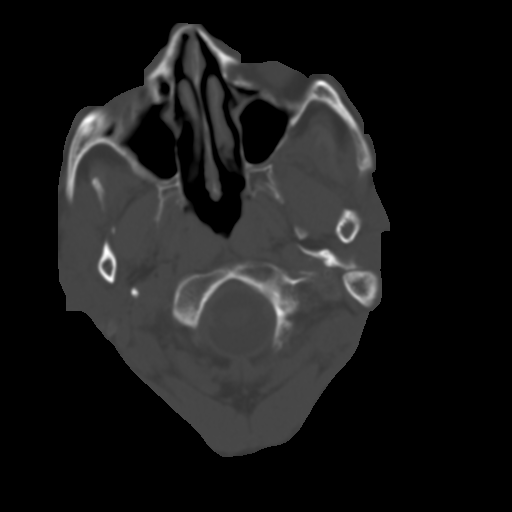
[im 7/32  brain]
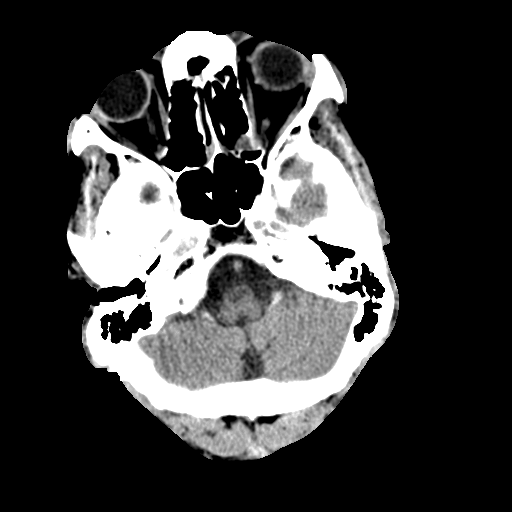
[im 9/32  brain]
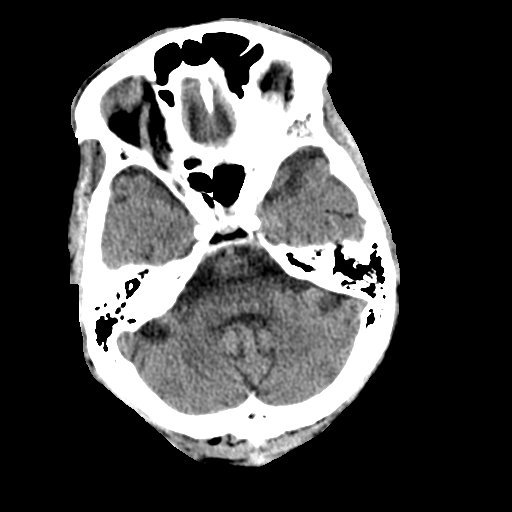
[im 14/32  brain]
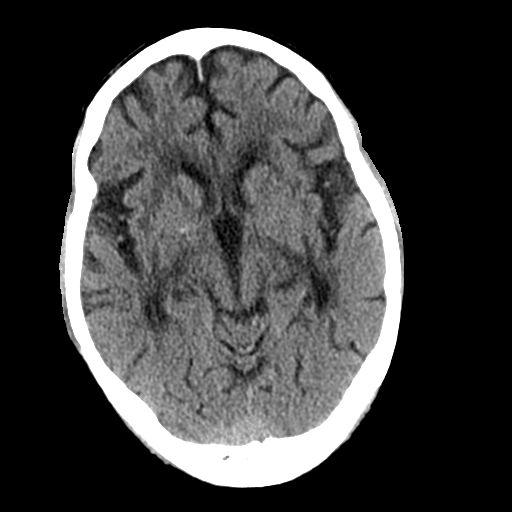
[im 16/32  brain]
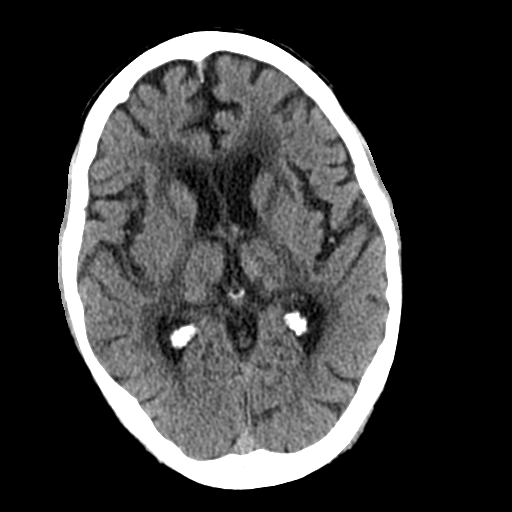
[im 16/32  bone]
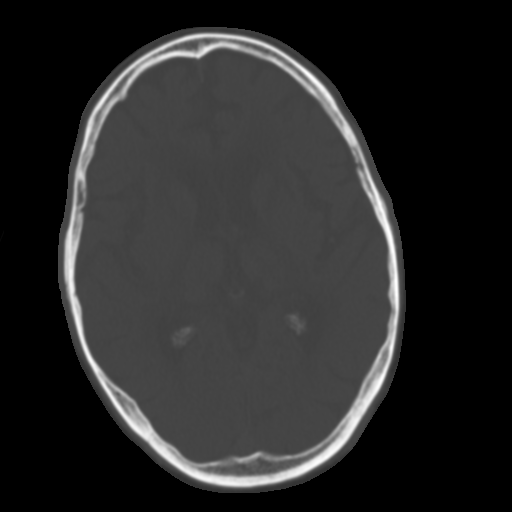
[im 18/32  brain]
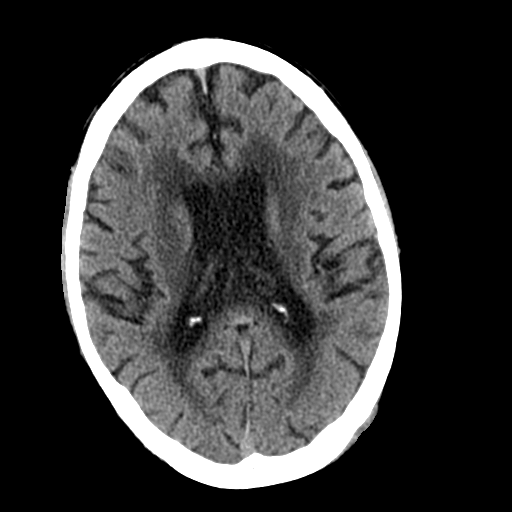
[im 23/32  brain]
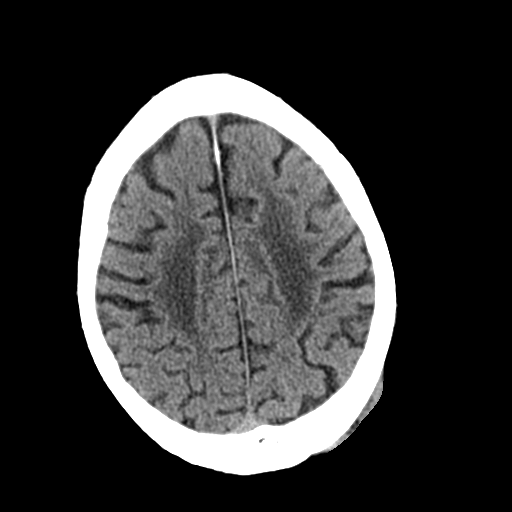
[im 25/32  brain]
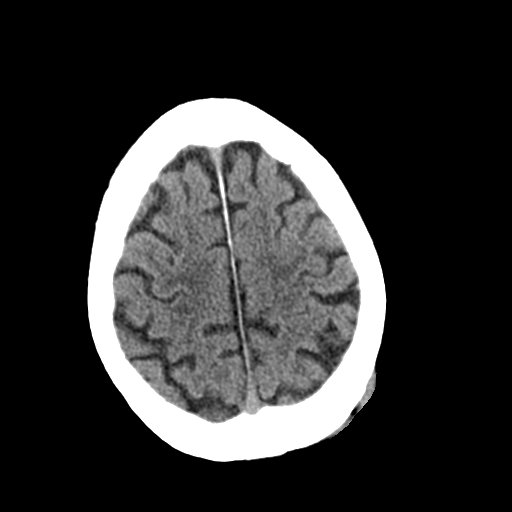
[im 29/32  brain]
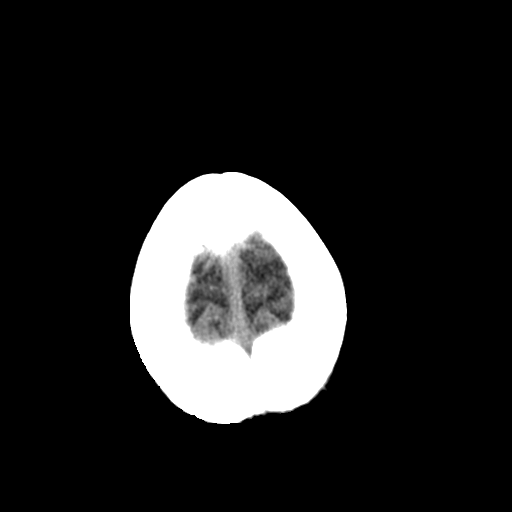
[im 29/32  bone]
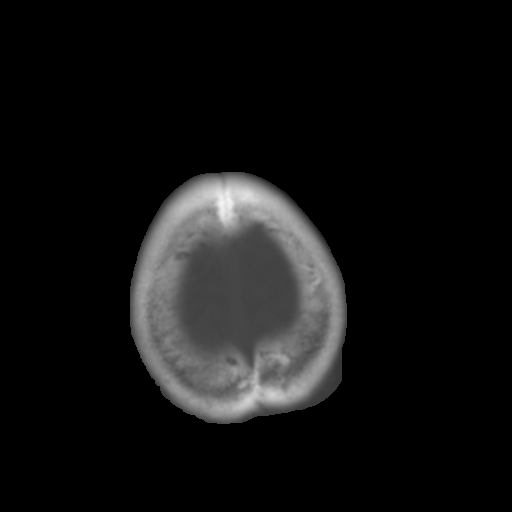

[Series 4: coronal soft · coronal · 0.32mm/px · 3 of 67 slices shown]
[im 23/67  brain]
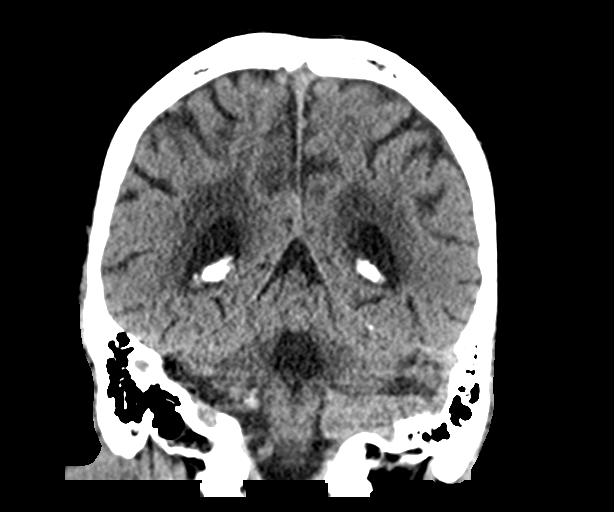
[im 30/67  brain]
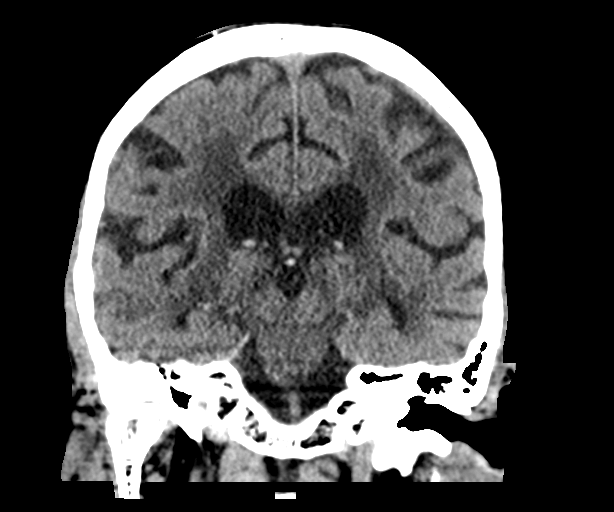
[im 37/67  brain]
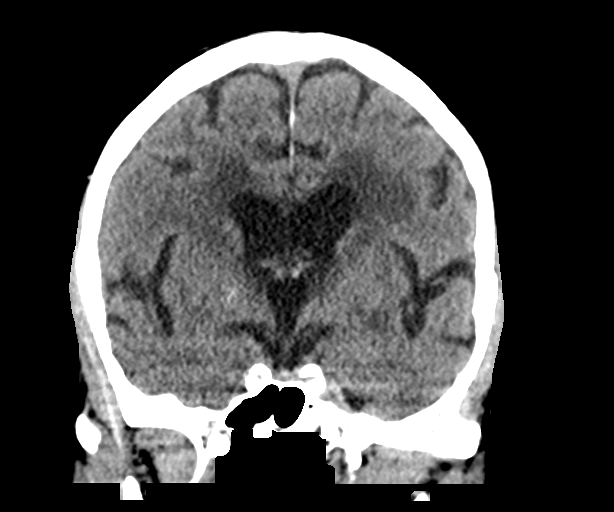

[Series 5: sagittal soft · sagittal · 0.36mm/px · 3 of 58 slices shown]
[im 20/58  brain]
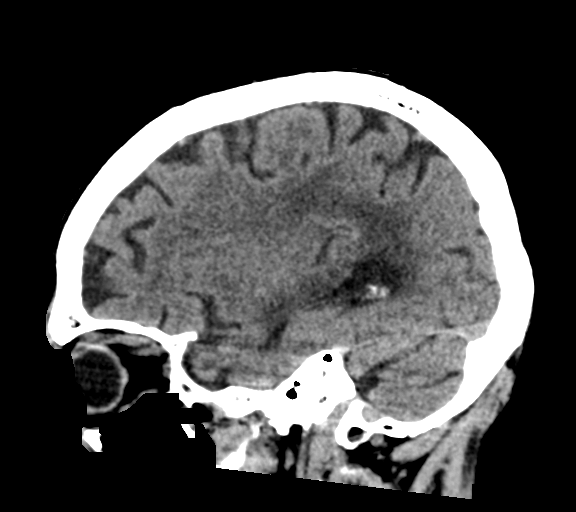
[im 29/58  brain]
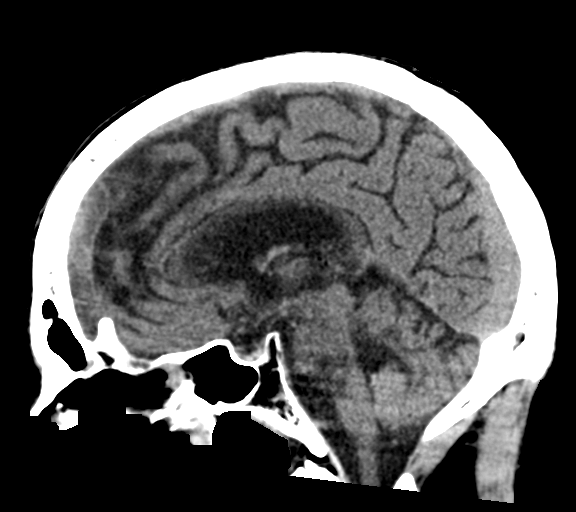
[im 39/58  brain]
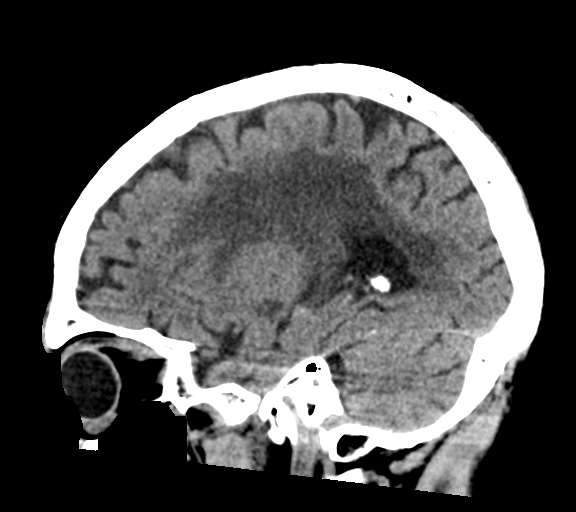

[15 of 47 positions shown; findings below may reference images not displayed]

FINDINGS: Brain: No evidence of acute infarction, hemorrhage, hydrocephalus,
extra-axial collection or mass lesion/mass effect. Chronic white
matter ischemic changes are noted. Mild atrophic changes are noted
as well.

Vascular: No hyperdense vessel or unexpected calcification.

Skull: Normal. Negative for fracture or focal lesion.

Sinuses/Orbits: No acute finding.

Other: Mild scalp hematoma is noted in the left posterior parietal
region.
IMPRESSION: Chronic white matter ischemic and atrophic changes without acute
intracranial abnormality.

Mild scalp hematoma posteriorly on the left as described.

## 2024-05-01 ENCOUNTER — Other Ambulatory Visit: Payer: Self-pay

## 2024-05-01 ENCOUNTER — Emergency Department (HOSPITAL_COMMUNITY)
Admission: EM | Admit: 2024-05-01 | Discharge: 2024-05-01 | Disposition: A | Discharge status: Home / Self Care | Attending: Emergency Medicine | Admitting: Emergency Medicine

## 2024-05-01 ENCOUNTER — Encounter (HOSPITAL_COMMUNITY): Payer: Self-pay

## 2024-05-01 ENCOUNTER — Emergency Department (HOSPITAL_COMMUNITY)

## 2024-05-01 DIAGNOSIS — Z7982 Long term (current) use of aspirin: Secondary | ICD-10-CM | POA: Diagnosis not present

## 2024-05-01 DIAGNOSIS — R531 Weakness: Secondary | ICD-10-CM | POA: Diagnosis not present

## 2024-05-01 DIAGNOSIS — R42 Dizziness and giddiness: Secondary | ICD-10-CM | POA: Diagnosis present

## 2024-05-01 LAB — URINALYSIS, ROUTINE W REFLEX MICROSCOPIC
Bilirubin Urine: NEGATIVE
Glucose, UA: NEGATIVE mg/dL
Hgb urine dipstick: NEGATIVE
Ketones, ur: NEGATIVE mg/dL
Leukocytes,Ua: NEGATIVE
Nitrite: NEGATIVE
Protein, ur: NEGATIVE mg/dL
Specific Gravity, Urine: 1.008 (ref 1.005–1.030)
pH: 7 (ref 5.0–8.0)

## 2024-05-01 LAB — COMPREHENSIVE METABOLIC PANEL WITH GFR
ALT: 17 U/L (ref 0–44)
AST: 17 U/L (ref 15–41)
Albumin: 3.6 g/dL (ref 3.5–5.0)
Alkaline Phosphatase: 49 U/L (ref 38–126)
Anion gap: 9 (ref 5–15)
BUN: 11 mg/dL (ref 8–23)
CO2: 24 mmol/L (ref 22–32)
Calcium: 9 mg/dL (ref 8.9–10.3)
Chloride: 97 mmol/L — ABNORMAL LOW (ref 98–111)
Creatinine, Ser: 0.54 mg/dL — ABNORMAL LOW (ref 0.61–1.24)
GFR, Estimated: >60 mL/min (ref >60)
Glucose, Bld: 111 mg/dL — ABNORMAL HIGH (ref 70–99)
Potassium: 3.8 mmol/L (ref 3.5–5.1)
Sodium: 130 mmol/L — ABNORMAL LOW (ref 135–145)
Total Bilirubin: 0.5 mg/dL (ref 0.0–1.2)
Total Protein: 6.6 g/dL (ref 6.5–8.1)

## 2024-05-01 LAB — CBC WITH DIFFERENTIAL/PLATELET
Abs Immature Granulocytes: 0.01 K/uL (ref 0.00–0.07)
Basophils Absolute: 0 K/uL (ref 0.0–0.1)
Basophils Relative: 0 %
Eosinophils Absolute: 1 K/uL — ABNORMAL HIGH (ref 0.0–0.5)
Eosinophils Relative: 15 %
HCT: 36.6 % — ABNORMAL LOW (ref 39.0–52.0)
Hemoglobin: 12.3 g/dL — ABNORMAL LOW (ref 13.0–17.0)
Immature Granulocytes: 0 %
Lymphocytes Relative: 18 %
Lymphs Abs: 1.3 K/uL (ref 0.7–4.0)
MCH: 30.1 pg (ref 26.0–34.0)
MCHC: 33.6 g/dL (ref 30.0–36.0)
MCV: 89.5 fL (ref 80.0–100.0)
Monocytes Absolute: 0.5 K/uL (ref 0.1–1.0)
Monocytes Relative: 7 %
Neutro Abs: 4.2 K/uL (ref 1.7–7.7)
Neutrophils Relative %: 60 %
Platelets: 242 K/uL (ref 150–400)
RBC: 4.09 MIL/uL — ABNORMAL LOW (ref 4.22–5.81)
RDW: 14.1 % (ref 11.5–15.5)
WBC: 7 K/uL (ref 4.0–10.5)
nRBC: 0 % (ref 0.0–0.2)

## 2024-05-01 NOTE — ED Notes (Signed)
 CSW spoke with patient at bedside regarding HHPT. PT shared that he is home alone often and needs some help. Pt agreeable to [facility] coming to assess him . CSW asked pt if he was going to cooperate since in the past he would refuse per spouse. PT stated,  as long as I am able to do what they tell me to do . CSW spoke with Darleene who accepted referral . Will ask MD to place orders. TOC signing off.

## 2024-05-01 NOTE — ED Provider Notes (Signed)
 [place] EMERGENCY DEPARTMENT AT [facility] Provider Note   CSN: [id number] Arrival date & time: 05/01/24  1145     Patient presents with: Dizziness   JAHMEEK SHIRK is a 68 y.o. male.    Dizziness Associated symptoms: weakness (generalized weakness)   Associated symptoms: no chest pain, no diarrhea, no nausea, no shortness of breath and no vomiting        JAEVEON ASHLAND is a 68 y.o. male well known to this emergency dept. For frequent complaint of dizziness.   Here today requesting food and states that he is weak and does not feel safe at home.  states he has to stay home during the day by himself as his wife works during the day he has difficulty standing and ambulating with a cane which is his baseline.  States he has not eaten all day and feels dizzy at times.  Denies any chest pain shortness of breath abdominal pain, visual changes,  nausea vomiting.  No headache or neck pain.  Prior to Admission medications   Medication Sig Start Date End Date Taking? Authorizing Provider  amLODipine  (NORVASC ) 5 MG tablet Take 1 tablet (5 mg total) by mouth daily. 07/19/23   Johnson, Clanford L, MD  aspirin  EC 81 MG tablet Take 1 tablet (81 mg total) by mouth daily. Swallow whole. 03/04/24   Amin, Sumayya, MD  gabapentin  (NEURONTIN ) 100 MG capsule Take 2 capsules (200 mg total) by mouth 2 (two) times daily. 03/03/24   Caleen Qualia, MD  lactulose , encephalopathy, (CHRONULAC ) 10 GM/15ML SOLN Take 45 mLs (30 g total) by mouth daily as needed (constipation). 03/03/24   Amin, Sumayya, MD  losartan  (COZAAR ) 25 MG tablet Take 1 tablet (25 mg total) by mouth daily. 07/19/23   Johnson, Clanford L, MD  meclizine  (ANTIVERT ) 25 MG tablet Take 1 tablet (25 mg total) by mouth 3 (three) times daily as needed for dizziness. 03/31/24   Cleotilde Rogue, MD  nicotine  (NICODERM CQ  - DOSED IN MG/24 HOURS) 21 mg/24hr patch Place 1 patch (21 mg total) onto the skin daily. 03/04/24   Amin, Sumayya, MD  sodium  chloride 1 g tablet Take 1 tablet (1 g total) by mouth 3 (three) times daily with meals. 03/03/24   Caleen Qualia, MD    Allergies: Other    Review of Systems  Constitutional:  Negative for chills and fever.  Eyes:  Negative for visual disturbance.  Respiratory:  Negative for shortness of breath.   Cardiovascular:  Negative for chest pain.  Gastrointestinal:  Negative for diarrhea, nausea and vomiting.  Genitourinary:  Negative for decreased urine volume, dysuria and frequency.  Musculoskeletal:  Negative for arthralgias.  Neurological:  Positive for dizziness, weakness (generalized weakness) and light-headedness. Negative for syncope.    Updated Vital Signs Ht 5' 8 (1.727 m)   Wt 59 kg   BMI 19.78 kg/m   Physical Exam Vitals and nursing note reviewed.  Constitutional:      General: He is not in acute distress.    Appearance: Normal appearance. He is not ill-appearing or toxic-appearing.  HENT:     Head: Atraumatic.     Mouth/Throat:     Mouth: Mucous membranes are moist.  Eyes:     Extraocular Movements: Extraocular movements intact.     Conjunctiva/sclera: Conjunctivae normal.     Pupils: Pupils are equal, round, and reactive to light.  Cardiovascular:     Rate and Rhythm: Normal rate and regular rhythm.  Pulses: Normal pulses.  Pulmonary:     Effort: Pulmonary effort is normal.     Breath sounds: Normal breath sounds.  Chest:     Chest wall: No tenderness.  Abdominal:     Palpations: Abdomen is soft.     Tenderness: There is no abdominal tenderness.  Musculoskeletal:     Right lower leg: No edema.     Left lower leg: No edema.  Skin:    General: Skin is warm.     Capillary Refill: Capillary refill takes less than 2 seconds.  Neurological:     General: No focal deficit present.     Mental Status: He is alert.     Sensory: No sensory deficit.     Motor: No weakness.     Coordination: Coordination normal.     (all labs ordered are listed, but only  abnormal results are displayed) Labs Reviewed  CBC WITH DIFFERENTIAL/PLATELET - Abnormal; Notable for the following components:      Result Value   RBC 4.09 (*)    Hemoglobin 12.3 (*)    HCT 36.6 (*)    Eosinophils Absolute 1.0 (*)    All other components within normal limits  COMPREHENSIVE METABOLIC PANEL WITH GFR - Abnormal; Notable for the following components:   Sodium 130 (*)    Chloride 97 (*)    Glucose, Bld 111 (*)    Creatinine, Ser 0.54 (*)    All other components within normal limits  URINALYSIS, ROUTINE W REFLEX MICROSCOPIC - Abnormal; Notable for the following components:   Color, Urine STRAW (*)    All other components within normal limits    EKG: None  Radiology: DG Chest Portable 1 View Result Date: 05/01/2024 CLINICAL DATA:  weakness EXAM: PORTABLE CHEST - 1 VIEW COMPARISON:  February 13, 2024 FINDINGS: No focal airspace consolidation, pleural effusion, or pneumothorax. No cardiomegaly. Tortuous aorta with aortic atherosclerosis. No acute fracture or destructive lesions. Multilevel thoracic osteophytosis. IMPRESSION: No acute cardiopulmonary abnormality. Electronically Signed   By: Rogelia Myers M.D.   On: 05/01/2024 14:19     Procedures   Medications Ordered in the ED - No data to display                                  Medical Decision Making Patient here requesting food stating that he feels generally weak and is afraid to stand or walk due to fear of falling.  States he is home by himself during the day while his wife is at work.  He has been seen here multiple times with similar complaint.  He denies any new or worsening symptoms.  On review of medical records patient has been known to be hyponatremic at times.  Generalized weakness without a focal deficit on his exam, vital signs are reassuring.  Possible electrolyte imbalance.  Infection versus neurologic process also considered but felt less likely.  On review of medical record, patient had MRI  brain in April without acute finding  Amount and/or Complexity of Data Reviewed Labs: ordered. Radiology: ordered. Discussion of management or test interpretation with external provider(s): Patient has been given oral fluids and snacks here, continues to demand additional food.  Has been rude to myself and staff.  I have offered consultation with social work to possibly arrange for home health, patient initially declined.  He has not discussed this with his PCP.  After lengthy discussion, patient is agreeable  to speak with social work for possible home health assistance and/or PT   Patient was seen by social work, will arrange for [facility] PT order.  Doubt emergent process.  Patient appears appropriate for discharge I recommended outpatient follow-up with his PCP as well        Final diagnoses:  Vertigo    ED Discharge Orders     None          Herlinda Milling, PA-C 05/02/24 1406    Suzette Pac, MD 05/03/24 [phone]

## 2024-05-01 NOTE — ED Triage Notes (Signed)
 Pt arrived via [facility] from home c/o recurrent vertigo and dizziness. Pt reports he does not feel safe at home due to always having to grab on to walls to catch his balance and reports he does ambulate with a cane. Pt seen here multiple times for same complaint. Pt denies any pain at this time.

## 2024-05-01 NOTE — Discharge Instructions (Signed)
 You should be receiving a follow-up phone call to schedule start of physical therapy through home health.  I recommend that you contact your primary care provider for recheck as well.
